# Patient Record
Sex: Female | Born: 1970 | Race: Black or African American | Hispanic: No | Marital: Married | Smoking: Never smoker
Health system: Southern US, Community
[De-identification: ages and names within clinical notes are randomized; demographics above are authoritative.]

## PROBLEM LIST (undated history)

## (undated) DIAGNOSIS — C50919 Malignant neoplasm of unspecified site of unspecified female breast: Secondary | ICD-10-CM

## (undated) DIAGNOSIS — J45909 Unspecified asthma, uncomplicated: Secondary | ICD-10-CM

## (undated) DIAGNOSIS — L509 Urticaria, unspecified: Secondary | ICD-10-CM

## (undated) DIAGNOSIS — K219 Gastro-esophageal reflux disease without esophagitis: Secondary | ICD-10-CM

## (undated) DIAGNOSIS — L409 Psoriasis, unspecified: Secondary | ICD-10-CM

## (undated) HISTORY — DX: Psoriasis, unspecified: L40.9

## (undated) HISTORY — PX: ABDOMINAL HYSTERECTOMY: SHX81

## (undated) HISTORY — DX: Urticaria, unspecified: L50.9

---

## 2003-09-14 ENCOUNTER — Emergency Department (HOSPITAL_COMMUNITY): Admission: EM | Admit: 2003-09-14 | Discharge: 2003-09-15 | Payer: Self-pay | Admitting: Emergency Medicine

## 2003-12-26 ENCOUNTER — Emergency Department (HOSPITAL_COMMUNITY): Admission: EM | Admit: 2003-12-26 | Discharge: 2003-12-26 | Payer: Self-pay | Admitting: Emergency Medicine

## 2004-02-15 ENCOUNTER — Emergency Department (HOSPITAL_COMMUNITY): Admission: EM | Admit: 2004-02-15 | Discharge: 2004-02-15 | Payer: Self-pay | Admitting: Family Medicine

## 2004-05-18 ENCOUNTER — Emergency Department (HOSPITAL_COMMUNITY): Admission: EM | Admit: 2004-05-18 | Discharge: 2004-05-18 | Payer: Self-pay | Admitting: Family Medicine

## 2004-12-07 ENCOUNTER — Emergency Department (HOSPITAL_COMMUNITY): Admission: EM | Admit: 2004-12-07 | Discharge: 2004-12-07 | Payer: Self-pay | Admitting: Family Medicine

## 2005-02-17 ENCOUNTER — Emergency Department (HOSPITAL_COMMUNITY): Admission: EM | Admit: 2005-02-17 | Discharge: 2005-02-18 | Payer: Self-pay | Admitting: Emergency Medicine

## 2005-06-26 ENCOUNTER — Other Ambulatory Visit: Admission: RE | Admit: 2005-06-26 | Discharge: 2005-06-26 | Payer: Self-pay | Admitting: Obstetrics and Gynecology

## 2005-10-24 ENCOUNTER — Other Ambulatory Visit: Admission: RE | Admit: 2005-10-24 | Discharge: 2005-10-24 | Payer: Self-pay | Admitting: Obstetrics and Gynecology

## 2005-11-13 ENCOUNTER — Ambulatory Visit: Admission: AD | Admit: 2005-11-13 | Discharge: 2005-11-13 | Payer: Self-pay | Admitting: Obstetrics and Gynecology

## 2006-11-07 ENCOUNTER — Inpatient Hospital Stay (HOSPITAL_COMMUNITY): Admission: AD | Admit: 2006-11-07 | Discharge: 2006-11-07 | Payer: Self-pay | Admitting: Obstetrics & Gynecology

## 2007-11-26 ENCOUNTER — Emergency Department (HOSPITAL_COMMUNITY): Admission: EM | Admit: 2007-11-26 | Discharge: 2007-11-26 | Payer: Self-pay | Admitting: Emergency Medicine

## 2010-12-16 LAB — URINALYSIS, ROUTINE W REFLEX MICROSCOPIC
Bilirubin Urine: NEGATIVE
Glucose, UA: NEGATIVE
Ketones, ur: NEGATIVE
Leukocytes, UA: NEGATIVE
Nitrite: NEGATIVE
Protein, ur: NEGATIVE
Specific Gravity, Urine: 1.02
Urobilinogen, UA: 0.2
pH: 6.5

## 2010-12-16 LAB — CBC
HCT: 31 — ABNORMAL LOW
Hemoglobin: 10.7 — ABNORMAL LOW
MCHC: 34.7
MCV: 85.4
Platelets: 199
RBC: 3.63 — ABNORMAL LOW
RDW: 16.2 — ABNORMAL HIGH
WBC: 6.5

## 2010-12-16 LAB — GC/CHLAMYDIA PROBE AMP, GENITAL
Chlamydia, DNA Probe: NEGATIVE
GC Probe Amp, Genital: NEGATIVE

## 2010-12-16 LAB — URINE MICROSCOPIC-ADD ON

## 2010-12-16 LAB — WET PREP, GENITAL
Clue Cells Wet Prep HPF POC: NONE SEEN
Trich, Wet Prep: NONE SEEN
Yeast Wet Prep HPF POC: NONE SEEN

## 2010-12-16 LAB — POCT PREGNANCY, URINE
Operator id: 117411
Preg Test, Ur: NEGATIVE

## 2014-06-18 ENCOUNTER — Ambulatory Visit: Payer: Self-pay | Admitting: Internal Medicine

## 2014-06-18 DIAGNOSIS — Z0289 Encounter for other administrative examinations: Secondary | ICD-10-CM

## 2015-10-04 ENCOUNTER — Emergency Department (HOSPITAL_COMMUNITY)
Admission: EM | Admit: 2015-10-04 | Discharge: 2015-10-04 | Disposition: A | Discharge status: Home / Self Care | Payer: Self-pay | Attending: Emergency Medicine | Admitting: Emergency Medicine

## 2015-10-04 ENCOUNTER — Encounter (HOSPITAL_COMMUNITY): Payer: Self-pay | Admitting: Emergency Medicine

## 2015-10-04 ENCOUNTER — Emergency Department (HOSPITAL_COMMUNITY): Payer: Self-pay

## 2015-10-04 DIAGNOSIS — Z79899 Other long term (current) drug therapy: Secondary | ICD-10-CM | POA: Insufficient documentation

## 2015-10-04 DIAGNOSIS — Z791 Long term (current) use of non-steroidal anti-inflammatories (NSAID): Secondary | ICD-10-CM | POA: Insufficient documentation

## 2015-10-04 DIAGNOSIS — R0789 Other chest pain: Secondary | ICD-10-CM | POA: Insufficient documentation

## 2015-10-04 DIAGNOSIS — Z7982 Long term (current) use of aspirin: Secondary | ICD-10-CM | POA: Insufficient documentation

## 2015-10-04 DIAGNOSIS — N12 Tubulo-interstitial nephritis, not specified as acute or chronic: Secondary | ICD-10-CM | POA: Insufficient documentation

## 2015-10-04 LAB — URINE MICROSCOPIC-ADD ON

## 2015-10-04 LAB — BASIC METABOLIC PANEL
Anion gap: 7 (ref 5–15)
BUN: 10 mg/dL (ref 6–20)
CO2: 26 mmol/L (ref 22–32)
Calcium: 9 mg/dL (ref 8.9–10.3)
Chloride: 103 mmol/L (ref 101–111)
Creatinine, Ser: 0.79 mg/dL (ref 0.44–1.00)
GFR calc Af Amer: >60 mL/min (ref >60)
GFR calc non Af Amer: >60 mL/min (ref >60)
Glucose, Bld: 101 mg/dL — ABNORMAL HIGH (ref 65–99)
Potassium: 3.3 mmol/L — ABNORMAL LOW (ref 3.5–5.1)
Sodium: 136 mmol/L (ref 135–145)

## 2015-10-04 LAB — CBC
HCT: 36.7 % (ref 36.0–46.0)
Hemoglobin: 12.9 g/dL (ref 12.0–15.0)
MCH: 31.1 pg (ref 26.0–34.0)
MCHC: 35.1 g/dL (ref 30.0–36.0)
MCV: 88.4 fL (ref 78.0–100.0)
Platelets: 202 10*3/uL (ref 150–400)
RBC: 4.15 MIL/uL (ref 3.87–5.11)
RDW: 12.9 % (ref 11.5–15.5)
WBC: 11.3 10*3/uL — ABNORMAL HIGH (ref 4.0–10.5)

## 2015-10-04 LAB — I-STAT TROPONIN, ED
Troponin i, poc: 0 ng/mL (ref 0.00–0.08)
Troponin i, poc: 0 ng/mL (ref 0.00–0.08)

## 2015-10-04 LAB — URINALYSIS, ROUTINE W REFLEX MICROSCOPIC
Bilirubin Urine: NEGATIVE
GLUCOSE, UA: NEGATIVE mg/dL
Ketones, ur: NEGATIVE mg/dL
Nitrite: NEGATIVE
PH: 7.5 (ref 5.0–8.0)
Protein, ur: 30 mg/dL — AB
Specific Gravity, Urine: 1.019 (ref 1.005–1.030)

## 2015-10-04 LAB — D-DIMER, QUANTITATIVE: D-Dimer, Quant: 0.38 ug/mL-FEU (ref 0.00–0.50)

## 2015-10-04 MED ORDER — OXYCODONE-ACETAMINOPHEN 5-325 MG PO TABS: 1.0000 | ORAL_TABLET | ORAL | 0 refills | Status: DC | PRN

## 2015-10-04 MED ORDER — MORPHINE SULFATE (PF) 4 MG/ML IV SOLN
4.0000 mg | Freq: Once | INTRAVENOUS | Status: AC
  Administered 2015-10-04: 4 mg via INTRAVENOUS
  Filled 2015-10-04: qty 1

## 2015-10-04 MED ORDER — ONDANSETRON HCL 4 MG/2ML IJ SOLN
4.0000 mg | Freq: Once | INTRAMUSCULAR | Status: AC
  Administered 2015-10-04: 4 mg via INTRAVENOUS
  Filled 2015-10-04: qty 2

## 2015-10-04 MED ORDER — KETOROLAC TROMETHAMINE 30 MG/ML IJ SOLN
30.0000 mg | Freq: Once | INTRAMUSCULAR | Status: AC
  Administered 2015-10-04: 30 mg via INTRAVENOUS
  Filled 2015-10-04: qty 1

## 2015-10-04 MED ORDER — SODIUM CHLORIDE 0.9 % IV BOLUS (SEPSIS)
1000.0000 mL | Freq: Once | INTRAVENOUS | Status: AC
  Administered 2015-10-04: 1000 mL via INTRAVENOUS

## 2015-10-04 MED ORDER — MORPHINE SULFATE (PF) 4 MG/ML IV SOLN
4.0000 mg | Freq: Once | INTRAVENOUS | Status: DC
Start: 2015-10-04 — End: 2015-10-04

## 2015-10-04 MED ORDER — DEXTROSE 5 % IV SOLN
1.0000 g | Freq: Once | INTRAVENOUS | Status: AC
  Administered 2015-10-04: 1 g via INTRAVENOUS
  Filled 2015-10-04: qty 10

## 2015-10-04 MED ORDER — ONDANSETRON HCL 4 MG PO TABS: 4.0000 mg | ORAL_TABLET | Freq: Four times a day (QID) | ORAL | 0 refills | Status: DC

## 2015-10-04 MED ORDER — CIPROFLOXACIN HCL 500 MG PO TABS: 500.0000 mg | ORAL_TABLET | Freq: Two times a day (BID) | ORAL | 0 refills | Status: DC

## 2015-10-04 MED ORDER — FLUCONAZOLE 200 MG PO TABS
200.0000 mg | ORAL_TABLET | Freq: Once | ORAL | Status: DC
  Filled 2015-10-04: qty 1

## 2015-10-04 NOTE — Progress Notes (Signed)
EDCM spoke to patient at bedside. Patient confirms she does not have a pcp or insurance living in [place].  [facility] provided patient with contact information to [facility], informed patient of services there and walk in times.  EDCM also provided patient with list of pcps who accept self pay patients, list of discount pharmacies and websites needymeds.org and GoodRX.com for medication assistance, phone number to inquire about the orange card, phone number to inquire about [employer], phone number to inquire about the [place], financial resources in the community such as local churches, [employer], urban ministries, and dental assistance for uninsured patients.  Patient thankful for resources.  No further EDCM needs at this time.

## 2015-10-04 NOTE — ED Triage Notes (Signed)
Pt reports lower back pain with associated "cloudy pee." Pt continues to reports new onset left chest sharpness with arrival to ED. Pt reports dry mouth with onset of chest pain.

## 2015-10-04 NOTE — ED Provider Notes (Signed)
[facility] DEPT Provider Note   CSN: [id number] Arrival date & time: 10/04/15  1602  First Provider Contact:  First MD Initiated Contact with Patient 10/04/15 1750        History   Chief Complaint Chief Complaint  Patient presents with  . Back Pain  . Chest Pain    HPI Darlene Peters is a 45 y.o. female.  HPI Darlene Peters is a 45 y.o. female with PMH significant for osteoarthritis who presents with gradual onset, constant, mild-moderate low back pain that has become severe today and in her b/l flanks.  Associated symptoms include cloudy urine and dysuria, and nausea.  No fever, but she has had chills.  She has taken Azos without relief.  She has taken Mobic and Naproxen without relief.  No vaginal complaints.   She also reports she began experiencing left sided non-radiating chest pain she describes as "being punched in the chest" today with associated palpitations, tachycardia, and SOB.  She states the pain lasts for a couple of seconds and is sporadic.  No DOE.  She states this has happened before in the past when she's been in pain.  No personal cardiac history.  No hx of DVT/PE.  She does not smoke, denies illicit drug use.  Dad died of MI at age 7.  History reviewed. No pertinent past medical history.  There are no active problems to display for this patient.   Past Surgical History:  Procedure Laterality Date  . ABDOMINAL HYSTERECTOMY     partial    OB History    No data available       Home Medications    Prior to Admission medications   Medication Sig Start Date End Date Taking? Authorizing Provider  acetaminophen (TYLENOL) 500 MG tablet Take 1,000 mg by mouth every 6 (six) hours as needed for headache.   Yes Historical Provider, MD  aspirin-acetaminophen-caffeine (EXCEDRIN MIGRAINE) [phone] MG tablet Take 1 tablet by mouth every 6 (six) hours as needed for headache.   Yes Historical Provider, MD  meloxicam (MOBIC) 7.5 MG tablet Take 7.5 mg by  mouth daily as needed for pain.   Yes Historical Provider, MD  ciprofloxacin (CIPRO) 500 MG tablet Take 1 tablet (500 mg total) by mouth 2 (two) times daily. 10/04/15   Mckenzee Beem, PA-C  ondansetron (ZOFRAN) 4 MG tablet Take 1 tablet (4 mg total) by mouth every 6 (six) hours. 10/04/15   Gloriann Loan, PA-C  oxyCODONE-acetaminophen (PERCOCET/ROXICET) 5-325 MG tablet Take 1 tablet by mouth every 4 (four) hours as needed for severe pain. 10/04/15   Gloriann Loan, PA-C    Family History No family history on file.  Social History Social History  Substance Use Topics  . Smoking status: Never Smoker  . Smokeless tobacco: Not on file  . Alcohol use No     Allergies   Review of patient's allergies indicates no known allergies.   Review of Systems Review of Systems All other systems negative unless otherwise stated in HPI   Physical Exam Updated Vital Signs BP 133/84 (BP Location: Right Arm)   Pulse 107   Temp 99.5 F (37.5 C) (Oral)   Resp 25   Ht 5\' 9"  (1.753 m)   Wt 83.9 kg   LMP  (LMP Unknown)   SpO2 97%   BMI 27.32 kg/m   Physical Exam  Constitutional: She is oriented to person, place, and time. She appears well-developed and well-nourished.  Non-toxic appearance. She does not have a  sickly appearance. She does not appear ill.  Appears uncomfortable, lying on side in the bed.   HENT:  Head: Normocephalic and atraumatic.  Mouth/Throat: Oropharynx is clear and moist.  Eyes: Conjunctivae are normal. Pupils are equal, round, and reactive to light.  Neck: Normal range of motion. Neck supple.  Cardiovascular: Regular rhythm.  Tachycardia present.   HR 120 on monitor.   Pulmonary/Chest: Effort normal and breath sounds normal. No accessory muscle usage or stridor. No respiratory distress. She has no wheezes. She has no rhonchi. She has no rales.  Abdominal: Soft. Bowel sounds are normal. She exhibits no distension and no mass. There is tenderness (mild) in the suprapubic area. There is  CVA tenderness (bilateral, R>L). There is no rigidity, no rebound and no guarding.  Musculoskeletal: Normal range of motion.  Lymphadenopathy:    She has no cervical adenopathy.  Neurological: She is alert and oriented to person, place, and time.  Speech clear without dysarthria.  Skin: Skin is warm and dry.  Psychiatric: She has a normal mood and affect. Her behavior is normal.     ED Treatments / Results  Labs (all labs ordered are listed, but only abnormal results are displayed) Labs Reviewed  BASIC METABOLIC PANEL - Abnormal; Notable for the following:       Result Value   Potassium 3.3 (*)    Glucose, Bld 101 (*)    All other components within normal limits  CBC - Abnormal; Notable for the following:    WBC 11.3 (*)    All other components within normal limits  URINALYSIS, ROUTINE W REFLEX MICROSCOPIC (NOT AT [facility]) - Abnormal; Notable for the following:    APPearance CLOUDY (*)    Hgb urine dipstick MODERATE (*)    Protein, ur 30 (*)    Leukocytes, UA MODERATE (*)    All other components within normal limits  URINE MICROSCOPIC-ADD ON - Abnormal; Notable for the following:    Squamous Epithelial / LPF 0-5 (*)    Bacteria, UA MANY (*)    All other components within normal limits  URINE CULTURE  D-DIMER, QUANTITATIVE (NOT AT [facility])  Randolm Idol, ED  I-STAT TROPOININ, ED    EKG  EKG Interpretation  Date/Time:  Monday October 04 2015 16:14:52 EDT Ventricular Rate:  124 PR Interval:    QRS Duration: 85 QT Interval:  305 QTC Calculation: 438 R Axis:   6 Text Interpretation:  Sinus tachycardia Probable left atrial enlargement Low voltage, precordial leads Minimal ST elevation, inferior leads Baseline wander in lead(s) V2 Confirmed by ZAMMIT  MD, JOSEPH [id number]) on 10/04/2015 7:08:22 PM       Radiology Dg Chest 2 View  Result Date: 10/04/2015 CLINICAL DATA:  Pt reports lower back pain with associated "cloudy pee." Pt continues to report new onset left chest  sharpness with arrival to ED. Pt reports dry mouth with onset of chest pain. No known heart problems. EXAM: CHEST  2 VIEW COMPARISON:  None. FINDINGS: The heart size and mediastinal contours are within normal limits. Both lungs are clear. The visualized skeletal structures are unremarkable. IMPRESSION: No active cardiopulmonary disease. Electronically Signed   By: Nolon Nations M.D.   On: 10/04/2015 17:22    Procedures Procedures (including critical care time)  Medications Ordered in ED Medications  sodium chloride 0.9 % bolus 1,000 mL (0 mLs Intravenous Stopped 10/04/15 1922)  morphine 4 MG/ML injection 4 mg (4 mg Intravenous Given 10/04/15 1829)  ondansetron (ZOFRAN) injection 4 mg (4  mg Intravenous Given 10/04/15 1830)  cefTRIAXone (ROCEPHIN) 1 g in dextrose 5 % 50 mL IVPB (0 g Intravenous Stopped 10/04/15 2054)  ketorolac (TORADOL) 30 MG/ML injection 30 mg (30 mg Intravenous Given 10/04/15 1939)  sodium chloride 0.9 % bolus 1,000 mL (0 mLs Intravenous Stopped 10/04/15 2054)  morphine 4 MG/ML injection 4 mg (4 mg Intravenous Given 10/04/15 2047)     Initial Impression / Assessment and Plan / ED Course  I have reviewed the triage vital signs and the nursing notes.  Pertinent labs & imaging results that were available during my care of the patient were reviewed by me and considered in my medical decision making (see chart for details).  Clinical Course   Patient presents with b/l flank pain, dysuria, and cloudy urine over the past couple of days with associated nausea.  UA appears infectious.  Urine culture sent.  Normal renal function.  Mild leukocytosis, 11.3. She is afebrile.  Suspect early pyelonephritis.  Will give fluids, zofran,  morphine, and 1g IV rocephin.   Patient presents with chest pain lasting a couple of seconds with associated palpitations, SOB, and tachycardia (HR 120 during PE).  No hx of DVT/PE.  Low risk using Wells' criteria, d-dimer normal.  EKG shows sinus tachycardia.   CXR normal.  Troponin x 2 0.00.  HEART score 1.  Suspect related to pain versus ACS.  Continued pain, will give Toradol and morphine.  Continues to be afebrile.  She is not orthostatic.  Patient received 2L IVF.  Pain controlled.  Tachycardia improved. Able to tolerate PO intake without difficulty.  Plan to discharge home with Cipro, percocet, and zofran. Follow up PCP. Return precautions discussed.  Patient agrees and acknowledges the above plan for discharge.   Case has been discussed with Dr. Roderic Palau who agrees with the above plan for discharge.   Final Clinical Impressions(s) / ED Diagnoses   Final diagnoses:  Pyelonephritis  Atypical chest pain    New Prescriptions New Prescriptions   CIPROFLOXACIN (CIPRO) 500 MG TABLET    Take 1 tablet (500 mg total) by mouth 2 (two) times daily.   ONDANSETRON (ZOFRAN) 4 MG TABLET    Take 1 tablet (4 mg total) by mouth every 6 (six) hours.   OXYCODONE-ACETAMINOPHEN (PERCOCET/ROXICET) 5-325 MG TABLET    Take 1 tablet by mouth every 4 (four) hours as needed for severe pain.     Gloriann Loan, PA-C 10/04/15 2123    Milton Ferguson, MD 10/04/15 [phone]

## 2015-10-07 LAB — URINE CULTURE: Culture: 70000 — AB

## 2015-10-08 ENCOUNTER — Telehealth (HOSPITAL_BASED_OUTPATIENT_CLINIC_OR_DEPARTMENT_OTHER): Payer: Self-pay | Admitting: *Deleted

## 2015-10-08 NOTE — Telephone Encounter (Signed)
Post ED Visit - Positive Culture Follow-up  Culture report reviewed by antimicrobial stewardship pharmacist:  []  Elenor Quinones, Pharm.D. [x]  Heide Guile, Pharm.D., BCPS []  Parks Neptune, Pharm.D. []  Alycia Rossetti, Pharm.D., BCPS []  [place], Pharm.D., BCPS, AAHIVP []  Legrand Como, Pharm.D., BCPS, AAHIVP []  Milus Glazier, Pharm.D. []  Stephens November, Pharm.D.  Positive Escherichia Coli urine culture Treated with ciprofloxacin Hcl, organism sensitive to the same and no further patient follow-up is required at this time.  Boden Stucky, Philis Nettle 10/08/2015, 2:25 PM

## 2016-05-18 ENCOUNTER — Other Ambulatory Visit: Payer: Self-pay | Admitting: Internal Medicine

## 2016-05-18 DIAGNOSIS — M5441 Lumbago with sciatica, right side: Secondary | ICD-10-CM

## 2016-06-04 ENCOUNTER — Other Ambulatory Visit: Payer: Self-pay

## 2017-01-11 DIAGNOSIS — M545 Low back pain, unspecified: Secondary | ICD-10-CM | POA: Insufficient documentation

## 2018-04-28 ENCOUNTER — Emergency Department (HOSPITAL_COMMUNITY)
Admission: EM | Admit: 2018-04-28 | Discharge: 2018-04-28 | Disposition: A | Discharge status: Home / Self Care | Payer: PRIVATE HEALTH INSURANCE | Attending: Emergency Medicine | Admitting: Emergency Medicine

## 2018-04-28 ENCOUNTER — Encounter (HOSPITAL_COMMUNITY): Payer: Self-pay | Admitting: Emergency Medicine

## 2018-04-28 DIAGNOSIS — R42 Dizziness and giddiness: Secondary | ICD-10-CM | POA: Diagnosis not present

## 2018-04-28 DIAGNOSIS — Z79899 Other long term (current) drug therapy: Secondary | ICD-10-CM | POA: Diagnosis not present

## 2018-04-28 DIAGNOSIS — R0789 Other chest pain: Secondary | ICD-10-CM | POA: Diagnosis not present

## 2018-04-28 LAB — RAPID URINE DRUG SCREEN, HOSP PERFORMED
AMPHETAMINES: NOT DETECTED
BARBITURATES: NOT DETECTED
Benzodiazepines: NOT DETECTED
COCAINE: NOT DETECTED
OPIATES: NOT DETECTED
TETRAHYDROCANNABINOL: NOT DETECTED

## 2018-04-28 LAB — URINALYSIS, ROUTINE W REFLEX MICROSCOPIC
Bacteria, UA: NONE SEEN
Bilirubin Urine: NEGATIVE
GLUCOSE, UA: NEGATIVE mg/dL
Ketones, ur: NEGATIVE mg/dL
Leukocytes,Ua: NEGATIVE
Nitrite: NEGATIVE
Protein, ur: NEGATIVE mg/dL
Specific Gravity, Urine: 1.01 (ref 1.005–1.030)
pH: 5 (ref 5.0–8.0)

## 2018-04-28 LAB — COMPREHENSIVE METABOLIC PANEL
ALBUMIN: 4.4 g/dL (ref 3.5–5.0)
ALT: 21 U/L (ref 0–44)
ANION GAP: 6 (ref 5–15)
AST: 21 U/L (ref 15–41)
Alkaline Phosphatase: 50 U/L (ref 38–126)
BILIRUBIN TOTAL: 0.6 mg/dL (ref 0.3–1.2)
BUN: 15 mg/dL (ref 6–20)
CO2: 25 mmol/L (ref 22–32)
Calcium: 9.3 mg/dL (ref 8.9–10.3)
Chloride: 106 mmol/L (ref 98–111)
Creatinine, Ser: 0.74 mg/dL (ref 0.44–1.00)
GFR calc Af Amer: >60 mL/min (ref >60)
GFR calc non Af Amer: >60 mL/min (ref >60)
Glucose, Bld: 95 mg/dL (ref 70–99)
POTASSIUM: 3.7 mmol/L (ref 3.5–5.1)
Sodium: 137 mmol/L (ref 135–145)
TOTAL PROTEIN: 7.6 g/dL (ref 6.5–8.1)

## 2018-04-28 LAB — CBC WITH DIFFERENTIAL/PLATELET
Abs Immature Granulocytes: 0.01 10*3/uL (ref 0.00–0.07)
BASOS ABS: 0 10*3/uL (ref 0.0–0.1)
Basophils Relative: 1 %
EOS ABS: 0.2 10*3/uL (ref 0.0–0.5)
Eosinophils Relative: 4 %
HEMATOCRIT: 41.5 % (ref 36.0–46.0)
HEMOGLOBIN: 14 g/dL (ref 12.0–15.0)
IMMATURE GRANULOCYTES: 0 %
LYMPHS ABS: 1.3 10*3/uL (ref 0.7–4.0)
LYMPHS PCT: 22 %
MCH: 31.5 pg (ref 26.0–34.0)
MCHC: 33.7 g/dL (ref 30.0–36.0)
MCV: 93.5 fL (ref 80.0–100.0)
Monocytes Absolute: 0.4 10*3/uL (ref 0.1–1.0)
Monocytes Relative: 6 %
NEUTROS PCT: 67 %
NRBC: 0 % (ref 0.0–0.2)
Neutro Abs: 4.2 10*3/uL (ref 1.7–7.7)
Platelets: 159 10*3/uL (ref 150–400)
RBC: 4.44 MIL/uL (ref 3.87–5.11)
RDW: 13 % (ref 11.5–15.5)
WBC: 6.1 10*3/uL (ref 4.0–10.5)

## 2018-04-28 LAB — I-STAT TROPONIN, ED
Troponin i, poc: 0 ng/mL (ref 0.00–0.08)
Troponin i, poc: 0.01 ng/mL (ref 0.00–0.08)

## 2018-04-28 LAB — PREGNANCY, URINE: Preg Test, Ur: NEGATIVE

## 2018-04-28 NOTE — ED Triage Notes (Signed)
Patient here from home with complaints of dizziness x3 this month. Reports "drunk feeling". Nausea, no vomiting. Hx of migraines. Currently pain 4/10 in head. Ambulatory.

## 2018-04-28 NOTE — ED Provider Notes (Addendum)
[facility] DEPT Provider Note   CSN: [id number] Arrival date & time: 04/28/18  0620    History   Chief Complaint Chief Complaint  Patient presents with  . Dizziness    HPI Darlene Peters is a 48 y.o. female with a PMH of migraines presents with intermittent dizziness onset this morning. Patient describes dizziness as the room is spinning and states it is worse with laying down and standing up. Patient states she had a similar episode of dizziness last month and she drank water with relief.  Patient states dizziness has improved on its own today. Patient states she feels like she has been drugged. Patient reports an intermittent mild frontal headache and states it is similar to previous headaches. Patient reports taking an Excedrin this morning with relief. Patient denies a headache currently. Patient reports nausea, but denies vomiting. Patient reports she frequently vomits when she has migraines. Patient also reports constant middle non radiating chest pain that she describes as a mild ache for 1 month. Nothing makes the chest pain better or worse. Patient denies using OCPs, recent travel, recent surgery, leg edema/pain, or hx of DVT/PE. Patient denies shortness of breath, weakness, vision changes, numbness, facial asymmetry, or difficulty speaking. Patient denies congestion, rhinorrhea, cough, neck pain, or fever. Patient denies tobacco, alcohol, or drug use.     HPI  History reviewed. No pertinent past medical history.  There are no active problems to display for this patient.   Past Surgical History:  Procedure Laterality Date  . ABDOMINAL HYSTERECTOMY     partial     OB History   No obstetric history on file.      Home Medications    Prior to Admission medications   Medication Sig Start Date End Date Taking? Authorizing Provider  ARMOUR THYROID 15 MG tablet Take 15 mg by mouth every morning. 04/12/18  Yes [provider]    aspirin-acetaminophen-caffeine (EXCEDRIN MIGRAINE) [phone] MG tablet Take 1 tablet by mouth every 6 (six) hours as needed for headache.   Yes [provider]  Multiple Vitamins-Minerals (MULTIVITAMIN PO) Take 1 tablet by mouth daily.   Yes [provider]    Family History No family history on file.  Social History Social History   Tobacco Use  . Smoking status: Never Smoker  . Smokeless tobacco: Never Used  Substance Use Topics  . Alcohol use: No  . Drug use: No    Allergies   Shellfish allergy   Review of Systems Review of Systems  Constitutional: Negative for activity change, appetite change, chills, diaphoresis, fatigue, fever and unexpected weight change.  HENT: Negative for congestion, rhinorrhea and sore throat.   Eyes: Negative for photophobia and visual disturbance.  Respiratory: Negative for cough, chest tightness and shortness of breath.   Cardiovascular: Positive for chest pain. Negative for palpitations and leg swelling.  Gastrointestinal: Positive for nausea. Negative for abdominal pain, blood in stool, diarrhea and vomiting.  Endocrine: Negative for cold intolerance and heat intolerance.  Genitourinary: Negative for dysuria.  Musculoskeletal: Negative for myalgias and neck pain.  Skin: Negative for wound.  Allergic/Immunologic: Negative for immunocompromised state.  Neurological: Positive for dizziness, light-headedness and headaches. Negative for tremors, seizures, syncope, facial asymmetry, speech difficulty, weakness and numbness.  Psychiatric/Behavioral: Negative for confusion, dysphoric mood and sleep disturbance. The patient is not nervous/anxious.     Physical Exam Updated Vital Signs BP 127/83   Pulse 73   Temp 97.7 F (36.5 C) (Oral)  Resp 19   LMP  (LMP Unknown)   SpO2 100%   Physical Exam Vitals signs and nursing note reviewed.  Constitutional:      General: She is not in acute distress.    Appearance: She is  well-developed. She is not diaphoretic.  HENT:     Head: Normocephalic and atraumatic.     Comments: Negative Scientist, forensic.    Right Ear: Tympanic membrane, ear canal and external ear normal.     Left Ear: Tympanic membrane, ear canal and external ear normal.     Nose: Nose normal. No congestion or rhinorrhea.     Mouth/Throat:     Mouth: Mucous membranes are moist.     Pharynx: No oropharyngeal exudate or posterior oropharyngeal erythema.  Eyes:     Extraocular Movements: Extraocular movements intact.     Conjunctiva/sclera: Conjunctivae normal.     Pupils: Pupils are equal, round, and reactive to light.  Neck:     Musculoskeletal: Normal range of motion and neck supple.     Vascular: No JVD.  Cardiovascular:     Rate and Rhythm: Normal rate and regular rhythm.     Pulses: Normal pulses.          Radial pulses are 2+ on the right side and 2+ on the left side.       Dorsalis pedis pulses are 2+ on the right side and 2+ on the left side.     Heart sounds: Normal heart sounds. No murmur. No friction rub. No gallop.   Pulmonary:     Effort: Pulmonary effort is normal. No respiratory distress.     Breath sounds: Normal breath sounds. No wheezing or rales.  Chest:     Chest wall: No tenderness.  Abdominal:     Palpations: Abdomen is soft.     Tenderness: There is no abdominal tenderness.  Musculoskeletal: Normal range of motion.  Skin:    General: Skin is warm.     Capillary Refill: Capillary refill takes less than 2 seconds.     Coloration: Skin is not pale.     Findings: No rash.  Neurological:     Mental Status: She is alert and oriented to person, place, and time.    Mental Status:  Alert, oriented, thought content appropriate, able to give a coherent history. Speech fluent without evidence of aphasia. Able to follow 2 step commands without difficulty.  Cranial Nerves:  II:  Peripheral visual fields grossly normal, pupils equal, round, reactive to light III,IV,  VI: ptosis not present, extra-ocular motions intact bilaterally  V,VII: smile symmetric, facial light touch sensation equal VIII: hearing grossly normal to voice  X: uvula elevates symmetrically  XI: bilateral shoulder shrug symmetric and strong XII: midline tongue extension without fassiculations Motor:  Normal tone. 5/5 in upper and lower extremities bilaterally including strong and equal grip strength and dorsiflexion/plantar flexion Sensory: light touch normal in all extremities.  Deep Tendon Reflexes: 2+ and symmetric in the biceps and patella Cerebellar: normal finger-to-nose with bilateral upper extremities Gait: normal gait and balance.  Negative pronator drift. Negative Romberg sign. CV: distal pulses palpable throughout   ED Treatments / Results  Labs (all labs ordered are listed, but only abnormal results are displayed) Labs Reviewed  URINALYSIS, ROUTINE W REFLEX MICROSCOPIC - Abnormal; Notable for the following components:      Result Value   Hgb urine dipstick MODERATE (*)    All other components within normal limits  PREGNANCY, URINE  CBC WITH DIFFERENTIAL/PLATELET  COMPREHENSIVE METABOLIC PANEL  RAPID URINE DRUG SCREEN, HOSP PERFORMED  I-STAT TROPONIN, ED  I-STAT TROPONIN, ED    EKG EKG Interpretation  Date/Time:  Sunday April 28 2018 08:41:53 EST Ventricular Rate:  74 PR Interval:    QRS Duration: 95 QT Interval:  400 QTC Calculation: 444 R Axis:   67 Text Interpretation:  Sinus rhythm Consider left atrial enlargement Low voltage, precordial leads ST elev, probable normal early repol pattern Baseline wander in lead(s) V5 Confirmed by Julianne Rice [phone]) on 04/28/2018 10:30:19 AM   Radiology No results found.  Procedures Procedures (including critical care time)  Medications Ordered in ED Medications - No data to display   Initial Impression / Assessment and Plan / ED Course  I have reviewed the triage vital signs and the nursing  notes.  Pertinent labs & imaging results that were available during my care of the patient were reviewed by me and considered in my medical decision making (see chart for details).  Clinical Course as of Apr 28 1348  Sun Apr 28, 2018  1127 CMP without any electrolyte abnormalities.  Comprehensive metabolic panel [AH]  3888 CBC is unremarkable.  CBC with Differential [AH]  1127 UDS negative.  Rapid urine drug screen (hospital performed) [AH]    Clinical Course User Index [AH] Arville Lime, PA-C      Patient is to be discharged with recommendation to follow up with PCP in regards to today's hospital visit. Suspect dizziness may be due to BPPV based on patient's history. Dizziness was not reproducible in the ER. Dizziness has resolved while in the ER without medications per patient's request. Orthostatic vitals were normal. Chest pain is not likely of cardiac or pulmonary etiology d/t presentation, PERC negative, no JVD or new murmur, RRR, breath sounds equal bilaterally, and negative troponin x 2. EKG reveals sinus rhythm with possible left atrial enlargement and precordial leads ST elevation, probable normal early repolarization pattern. Discussed findings with supervising physician. Pt has been advised to return to the ED if CP becomes exertional, associated with diaphoresis or nausea, radiates to left jaw/arm, worsens or becomes concerning in any way. Offered Meclizine for dizziness and patient states she prefers to not take medications. Advised patient to follow up with PCP and cardiology. Pt appears reliable for follow up and is agreeable to discharge.   Findings and plan of care discussed with supervising physician Dr. Lita Mains.   Final Clinical Impressions(s) / ED Diagnoses   Final diagnoses:  Dizziness  Atypical chest pain    ED Discharge Orders    None        Arville Lime, [place] 04/28/18 1350    Julianne Rice, MD 04/28/18 1515

## 2018-04-28 NOTE — Discharge Instructions (Addendum)
You have been seen today for dizziness and chest pain. Please read and follow all provided instructions.   1. Medications: usual home medications 2. Treatment: rest, drink plenty of fluids 3. Follow Up: Please follow up with your primary doctor in 2 days for discussion of your diagnoses and further evaluation after today's visit; if you do not have a primary care doctor use the resource guide provided to find one; Please return to the ER for any new or worsening symptoms. Please obtain all of your results from medical records or have your doctors office obtain the results - share them with your doctor - you should be seen at your doctors office. Call today to arrange your follow up.   Take medications as prescribed. Please review all of the medicines and only take them if you do not have an allergy to them. Return to the emergency room for worsening condition or new concerning symptoms. Follow up with your regular doctor. If you don't have a regular doctor use one of the numbers below to establish a primary care doctor.  Please be aware that if you are taking birth control pills, taking other prescriptions, ESPECIALLY ANTIBIOTICS may make the birth control ineffective - if this is the case, either do not engage in sexual activity or use alternative methods of birth control such as condoms until you have finished the medicine and your family doctor says it is OK to restart them. If you are on a blood thinner such as COUMADIN, be aware that any other medicine that you take may cause the coumadin to either work too much, or not enough - you should have your coumadin level rechecked in next 7 days if this is the case.  ?  It is also a possibility that you have an allergic reaction to any of the medicines that you have been prescribed - Everybody reacts differently to medications and while MOST people have no trouble with most medicines, you may have a reaction such as nausea, vomiting, rash, swelling,  shortness of breath. If this is the case, please stop taking the medicine immediately and contact your physician.  ?  You should return to the ER if you develop severe or worsening symptoms.   Emergency Department Resource Guide 1) Find a Doctor and Pay Out of Pocket Although you won't have to find out who is covered by your insurance plan, it is a good idea to ask around and get recommendations. You will then need to call the office and see if the doctor you have chosen will accept you as a new patient and what types of options they offer for patients who are self-pay. Some doctors offer discounts or will set up payment plans for their patients who do not have insurance, but you will need to ask so you aren't surprised when you get to your appointment.  2) Contact Your Local Health Department Not all health departments have doctors that can see patients for sick visits, but many do, so it is worth a call to see if yours does. If you don't know where your local health department is, you can check in your phone book. The CDC also has a tool to help you locate your state's health department, and many state websites also have listings of all of their local health departments.  3) Find a [facility] If your illness is not likely to be very severe or complicated, you may want to try a walk in clinic. These are popping up  all over the country in pharmacies, drugstores, and shopping centers. They're usually staffed by nurse practitioners or physician assistants that have been trained to treat common illnesses and complaints. They're usually fairly quick and inexpensive. However, if you have serious medical issues or chronic medical problems, these are probably not your best option.  No Primary Care Doctor: Call Health Connect at  [phone] - they can help you locate a primary care doctor that  accepts your insurance, provides certain services, etc. Physician Referral [phone]  Emergency  Department Resource Guide 1) Find a Doctor and Pay Out of Pocket Although you won't have to find out who is covered by your insurance plan, it is a good idea to ask around and get recommendations. You will then need to call the office and see if the doctor you have chosen will accept you as a new patient and what types of options they offer for patients who are self-pay. Some doctors offer discounts or will set up payment plans for their patients who do not have insurance, but you will need to ask so you aren't surprised when you get to your appointment.  2) Contact Your Local Health Department Not all health departments have doctors that can see patients for sick visits, but many do, so it is worth a call to see if yours does. If you don't know where your local health department is, you can check in your phone book. The CDC also has a tool to help you locate your state's health department, and many state websites also have listings of all of their local health departments.  3) Find a [facility] If your illness is not likely to be very severe or complicated, you may want to try a walk in clinic. These are popping up all over the country in pharmacies, drugstores, and shopping centers. They're usually staffed by nurse practitioners or physician assistants that have been trained to treat common illnesses and complaints. They're usually fairly quick and inexpensive. However, if you have serious medical issues or chronic medical problems, these are probably not your best option.  No Primary Care Doctor: Call Health Connect at  [phone] - they can help you locate a primary care doctor that  accepts your insurance, provides certain services, etc. Physician Referral Service- [phone]  Chronic Pain Problems: Organization         Address  Phone   Notes  [facility]  [phone] Patients need to be referred by their primary care doctor.   Medication  Assistance: Organization         Address  Phone   Notes  [facility] [address] [place], [place] [address] [phone] --Must be a resident of [facility] -- Must have NO insurance coverage whatsoever (no Medicaid/ Medicare, etc.) -- The pt. MUST have a primary care doctor that directs their care regularly and follows them in the community   [facility]  [phone]   [employer]  [phone]    Agencies that provide inexpensive medical care: Organization         Address  Phone   Notes  [place]  [phone]   [facility]    [phone]   [facility] [place], [place] [address] [phone]   [place] [address], [place] [phone]   [facility]    [phone]  [facility]    [phone]   [facility]  [address], [place] Phone:  [phone], Fax:  [phone] Hours of Operation:  9 am - 6 pm, M-F.  Also accepts Medicaid/Medicare and self-pay.  [facility] for [address], [address], [place] Phone: [phone], Fax: [phone]. Hours of Operation:  8:30 am - 5:30 pm, M-F.  Also accepts Medicaid and self-pay.  [facility] [address], [place] Phone: [phone]   [place], [place], [place] [phone], [phone] Mondays & Thursdays: 7-9 AM.  First 15 patients are seen on a first come, first serve basis.    [place] Providers:  Organization         Address  Phone   Notes  [facility] [address], [place] [phone] Also accepts self-pay patients.  [facility] [address], [place]  [phone]   [place], [address], [place]  [phone]   [facility] [address] [place] [phone]   Lucianne Lei [address], [place]   [phone] Only accepts [place] [facility] [place] patients after they have their name applied to their card.   Self-Pay (no insurance) in [facility]:  Organization         Address  Phone   Notes  Sickle Cell Patients, [facility] [phone]   [facility] [place] [phone]   [facility] [place]  [place], [place], [place] [phone]   [facility]/Dr. Osei-Bonsu  [address] [place] or [address], [place] [phone] Phone number for both [place] and [place] locations is the same.  [facility] and [facility] [address], [place] [phone]   [facility] [address], [place] or [address] [phone]   [facility] [address], [place] [phone], phone; [phone], fax Sees patients 1st and 3rd Saturday of every month.  Must not qualify for public or private insurance (i.e. Medicaid, Medicare, [place] Health Choice, Veterans' Benefits)  Household income should be no more than 200% of the poverty level The clinic cannot treat you if you are pregnant or think you are pregnant  Sexually transmitted diseases are not treated at the clinic.

## 2018-05-09 ENCOUNTER — Ambulatory Visit: Payer: BLUE CROSS/BLUE SHIELD | Admitting: Physician Assistant

## 2018-05-30 ENCOUNTER — Ambulatory Visit: Payer: PRIVATE HEALTH INSURANCE | Admitting: Cardiology

## 2018-07-05 DIAGNOSIS — J4520 Mild intermittent asthma, uncomplicated: Secondary | ICD-10-CM | POA: Diagnosis present

## 2019-05-01 DIAGNOSIS — K219 Gastro-esophageal reflux disease without esophagitis: Secondary | ICD-10-CM | POA: Insufficient documentation

## 2019-06-18 ENCOUNTER — Ambulatory Visit: Payer: PRIVATE HEALTH INSURANCE | Admitting: Dermatology

## 2019-09-03 ENCOUNTER — Telehealth: Payer: Self-pay

## 2019-09-03 NOTE — Telephone Encounter (Signed)
Error

## 2019-09-03 NOTE — Telephone Encounter (Signed)
Patient was here in our [place] system to see you on 05/05/19. You prescribed Calcipotriene for her feet and Elidel for her atopic derm. She was unable to purchase due to the cost of medication.   Can you suggest/advise alternative for her to use until her follow up in August?

## 2019-09-03 NOTE — Telephone Encounter (Signed)
Send Protopic for feet and Eczema. If it can be filled affordably, then have pt call after 1 month of use to let us know if working OK.

## 2019-09-04 MED ORDER — TACROLIMUS 0.1 % EX OINT: TOPICAL_OINTMENT | Freq: Every day | CUTANEOUS | 0 refills | Status: DC

## 2019-09-04 NOTE — Addendum Note (Signed)
Addended by: Johnsie Kindred R on: 09/04/2019 09:59 AM   Modules accepted: Orders

## 2019-09-04 NOTE — Telephone Encounter (Signed)
Prescription sent in and emailed patient to advise her.

## 2019-10-14 DIAGNOSIS — L409 Psoriasis, unspecified: Secondary | ICD-10-CM | POA: Insufficient documentation

## 2019-10-20 ENCOUNTER — Ambulatory Visit: Payer: BC Managed Care – PPO | Admitting: Dermatology

## 2019-11-05 DIAGNOSIS — R5383 Other fatigue: Secondary | ICD-10-CM | POA: Insufficient documentation

## 2021-04-12 ENCOUNTER — Other Ambulatory Visit: Payer: Self-pay | Admitting: Family Medicine

## 2021-04-12 DIAGNOSIS — N63 Unspecified lump in unspecified breast: Secondary | ICD-10-CM

## 2021-04-27 ENCOUNTER — Other Ambulatory Visit: Payer: Self-pay | Admitting: Family Medicine

## 2021-05-10 ENCOUNTER — Other Ambulatory Visit: Payer: Self-pay | Admitting: Family Medicine

## 2021-05-10 DIAGNOSIS — N63 Unspecified lump in unspecified breast: Secondary | ICD-10-CM

## 2021-05-23 ENCOUNTER — Ambulatory Visit: Payer: BC Managed Care – PPO

## 2021-05-23 ENCOUNTER — Ambulatory Visit
Admission: RE | Admit: 2021-05-23 | Discharge: 2021-05-23 | Disposition: A | Discharge status: Home / Self Care | Payer: BC Managed Care – PPO | Source: Ambulatory Visit | Attending: Family Medicine | Admitting: Family Medicine

## 2021-05-23 ENCOUNTER — Other Ambulatory Visit: Payer: Self-pay | Admitting: Family Medicine

## 2021-05-23 ENCOUNTER — Ambulatory Visit
Admission: RE | Admit: 2021-05-23 | Discharge: 2021-05-23 | Disposition: A | Discharge status: Home / Self Care | Payer: 59 | Source: Ambulatory Visit | Attending: Family Medicine | Admitting: Family Medicine

## 2021-05-23 DIAGNOSIS — N63 Unspecified lump in unspecified breast: Secondary | ICD-10-CM

## 2021-05-23 DIAGNOSIS — R599 Enlarged lymph nodes, unspecified: Secondary | ICD-10-CM

## 2021-05-23 DIAGNOSIS — N6320 Unspecified lump in the left breast, unspecified quadrant: Secondary | ICD-10-CM

## 2021-05-23 DIAGNOSIS — N6002 Solitary cyst of left breast: Secondary | ICD-10-CM

## 2021-05-25 ENCOUNTER — Other Ambulatory Visit: Payer: Self-pay | Admitting: Dermatology

## 2021-05-26 ENCOUNTER — Ambulatory Visit
Admission: RE | Admit: 2021-05-26 | Discharge: 2021-05-26 | Disposition: A | Discharge status: Home / Self Care | Payer: 59 | Source: Ambulatory Visit | Attending: Family Medicine | Admitting: Family Medicine

## 2021-05-26 DIAGNOSIS — N6320 Unspecified lump in the left breast, unspecified quadrant: Secondary | ICD-10-CM

## 2021-05-26 DIAGNOSIS — N6002 Solitary cyst of left breast: Secondary | ICD-10-CM

## 2021-05-26 DIAGNOSIS — R599 Enlarged lymph nodes, unspecified: Secondary | ICD-10-CM

## 2021-05-26 HISTORY — PX: BREAST BIOPSY: SHX20

## 2021-06-08 ENCOUNTER — Other Ambulatory Visit: Payer: Self-pay | Admitting: General Surgery

## 2021-06-08 DIAGNOSIS — Z170 Estrogen receptor positive status [ER+]: Secondary | ICD-10-CM

## 2021-06-14 ENCOUNTER — Other Ambulatory Visit: Payer: Self-pay | Admitting: *Deleted

## 2021-06-14 ENCOUNTER — Telehealth: Payer: Self-pay | Admitting: Radiation Oncology

## 2021-06-14 DIAGNOSIS — Z170 Estrogen receptor positive status [ER+]: Secondary | ICD-10-CM | POA: Insufficient documentation

## 2021-06-14 DIAGNOSIS — C50412 Malignant neoplasm of upper-outer quadrant of left female breast: Secondary | ICD-10-CM | POA: Insufficient documentation

## 2021-06-14 NOTE — Telephone Encounter (Signed)
4/11 @ 2:39 pm Left voicemail for patient to call our office.   ?

## 2021-06-15 ENCOUNTER — Telehealth: Payer: Self-pay | Admitting: Hematology and Oncology

## 2021-06-15 ENCOUNTER — Telehealth: Payer: Self-pay | Admitting: Radiation Oncology

## 2021-06-15 ENCOUNTER — Telehealth: Payer: Self-pay | Admitting: Genetic Counselor

## 2021-06-15 NOTE — Telephone Encounter (Signed)
Scheduled appt per 4/11 referral. Pt is aware of appt date and time. Pt is aware to arrive 15 mins prior to appt time and to bring and updated insurance card. Pt is aware of appt location.   ?

## 2021-06-15 NOTE — Telephone Encounter (Signed)
4/12 @ 10:25 am patient left voicemail to go ahead and schedule appointment with Dr. Isidore Moos.  Left voicemail with patient to confirm date/time of appointment.   ?

## 2021-06-19 NOTE — Therapy (Signed)
?OUTPATIENT PHYSICAL THERAPY BREAST CANCER BASELINE EVALUATION ? ? ?Patient Name: Darlene Peters ?MRN: [id number] ?DOB:07/02/1970, 51 y.o., female ?Today's Date: 06/20/2021 ? ? PT End of Session - 06/20/21 1803   ? ? Visit Number 1   ? Number of Visits 2   ? Date for PT Re-Evaluation 08/15/21   ? PT Start Time 1406   ? PT Stop Time 1500   ? PT Time Calculation (min) 54 min   ? Activity Tolerance Patient tolerated treatment well   ? Behavior During Therapy [facility] for tasks assessed/performed   ? ?  ?  ? ?  ? ? ?Past Medical History:  ?Diagnosis Date  ? Psoriasis   ? ?Past Surgical History:  ?Procedure Laterality Date  ? ABDOMINAL HYSTERECTOMY    ? partial  ? BREAST BIOPSY Left 05/26/2021  ? ?Patient Active Problem List  ? Diagnosis Date Noted  ? Malignant neoplasm of upper-outer quadrant of left breast in female, estrogen receptor positive ([place]) 06/14/2021  ? ? ?PCP: Katherina Mires, MD ? ?REFERRING PROVIDER: Jovita Kussmaul, MD ? ?REFERRING DIAG: Left Breast Cancer ? ?THERAPY DIAG:  ?Malignant neoplasm of upper-outer quadrant of left breast in female, estrogen receptor positive ([place]) ? ?Abnormal posture ? ?ONSET DATE: 05/31/2021 ? ?SUBJECTIVE                                                                                                                                                                                          ? ?SUBJECTIVE STATEMENT: ?Patient reports she is here today to be seen by her medical team for her newly diagnosed left breast cancer.  ? ?PERTINENT HISTORY:  ?Patient was diagnosed on 05/31/2021 with left Breast Cancer. It measures 2.6 cm and is located in the upper-outer quadrant. It is ER+,PR-, HER 2 - with a Ki67 of <5%. . She has not yet determined what kind of surgery she will have and she does not have a date yet..  She had an MRI today that will help determine that. ? ?PATIENT GOALS   reduce lymphedema risk and learn post op HEP.  ? ?PAIN:  ?Are you having pain? Yes ?PAIN:  ?Are you having pain?  Yes,  left axilla,also in  neck,LB/spleen area/knees ?NPRS scale: 5/10 ?Pain location: left axilla ?Pain orientation: Left  ?PAIN TYPE: sharp and tingling ?Pain description: sharp  ?Aggravating factors: sleeping on stomach ?Relieving factors: nothing ? ? ?PRECAUTIONS: Active CA Other: Psoriasis ? ?HAND DOMINANCE: right ? ?WEIGHT BEARING RESTRICTIONS No ? ?FALLS:  ?Has patient fallen in last 6 months? No ? ?LIVING ENVIRONMENT: ?Patient lives with: spouse ?Lives in: House/apartment ?Has following equipment at  home: None ? ?OCCUPATION: [employer], telephone work ? ?LEISURE: walking, loom, reading ? ?PRIOR LEVEL OF FUNCTION: Independent ? ? ?OBJECTIVE ? ?COGNITION: ? Overall cognitive status: Within functional limits for tasks assessed   ? ?POSTURE:  ?Forward head and rounded shoulders posture ? ?UPPER EXTREMITY AROM/PROM: ? ?A/PROM RIGHT  06/20/2021 ?  ?Shoulder extension 62  ?Shoulder flexion 150  ?Shoulder abduction 165  ?Shoulder internal rotation 75  ?Shoulder external rotation 105  ?  (Blank rows = not tested) ? ?A/PROM LEFT  06/20/2021  ?Shoulder extension 38, back of arm  ?Shoulder flexion 130  ?Shoulder abduction 145  ?Shoulder internal rotation NT  ?Shoulder external rotation 80 sharp pains in neck  ?  (Blank rows = not tested) ? ? ?CERVICAL AROM: ?All within normal limits except slight limitation with right rotation with complaints of sharp pain in neck and down left arm, improved after ROM exercises;otherwise WNL ? ? ? ? ?UPPER EXTREMITY STRENGTH: WNL ? ? ?LYMPHEDEMA ASSESSMENTS:  ? ?LANDMARK RIGHT  06/20/2021  ?10 cm proximal to olecranon process 31.0  ?Olecranon process 25.7  ?10 cm proximal to ulnar styloid process 20.3  ?Just proximal to ulnar styloid process 15.1  ?Across hand at thumb web space 20.1  ?At base of 2nd digit 5.9  ?(Blank rows = not tested) ? ?[place] LEFT  06/20/2021  ?10 cm proximal to olecranon process 30.2  ?Olecranon process 26.1  ?10 cm proximal to ulnar styloid process 19.1  ?Just proximal to  ulnar styloid process 14.7  ?Across hand at thumb web space 19.3  ?At base of 2nd digit 6.0  ?(Blank rows = not tested) ? ? ?L-DEX LYMPHEDEMA SCREENING: ? ?The patient was assessed using the L-Dex machine today to produce a lymphedema index baseline score. The patient will be reassessed on a regular basis (typically every 3 months) to obtain new L-Dex scores. If the score is > 6.5 points away from his/her baseline score indicating onset of subclinical lymphedema, it will be recommended to wear a compression garment for 4 weeks, 12 hours per day and then be reassessed. If the score continues to be > 6.5 points from baseline at reassessment, we will initiate lymphedema treatment. Assessing in this manner has a 95% rate of preventing clinically significant lymphedema. ? ? L-DEX FLOWSHEETS - 06/20/21 1800   ? ?  ? L-DEX LYMPHEDEMA SCREENING  ? L-DEX MEASUREMENT EXTREMITY Upper Extremity   ? POSITION  Standing   ? DOMINANT SIDE Right   ? At Risk Side Left   ? BASELINE SCORE (UNILATERAL) 0.5   ? ?  ?  ? ?  ? ? ? ?QUICK DASH SURVEY:NT ? ? ?PATIENT EDUCATION:  ?Education details: Lymphedema risk reduction and post op shoulder/posture HEP ?Person educated: Patient ?Education method: Explanation, Demonstration, Handout ?Education comprehension: Patient verbalized understanding and returned demonstration ? ? ?HOME EXERCISE PROGRAM: ?Patient was instructed today in a home exercise program today for post op shoulder range of motion. These included active assist shoulder flexion in supine, scapular retraction, wall walking with shoulder abduction, and hands behind head external rotation in supine, and sitting cervical retraction.  She was encouraged to do these twice a day, holding 3 seconds and repeating 5 times when permitted by her physician. ? ?ASSESSMENT: ? ?CLINICAL IMPRESSION: ?Her multidisciplinary medical team met prior to her assessments to determine a recommended treatment plan. They will determine what type of surgery  she will have after getting her MRI results.She is hoping to have a left lumpectomy . She  did have some intermittent radiating sharp pains with shoulder ROM, and cervical right rotation, in the left lateral neck and into the arm, but these seemed to subside when she relaxed and did her exercises slowly and gradually progressed She will benefit from a post op PT reassessment to determine needs and from L-Dex screens every 3 months for 2 years to detect subclinical lymphedema. ? ?Pt will benefit from skilled therapeutic intervention to improve on the following deficits: Decreased knowledge of precautions, impaired UE functional use, pain, decreased ROM, postural dysfunction.  ? ?PT treatment/interventions: ADL/self-care home management, pt/family education, therapeutic exercise ? ?REHAB POTENTIAL: Excellent ? ?CLINICAL DECISION MAKING: Stable/uncomplicated ? ?EVALUATION COMPLEXITY: Low ? ? ?GOALS: ?Goals reviewed with patient? YES ? ?LONG TERM GOALS: (STG=LTG) ? ? Name Target Date Goal status  ?1 Pt will be able to verbalize understanding of pertinent lymphedema risk reduction practices relevant to her dx specifically related to skin care.  ?Baseline:  No knowledge 06/20/2021 Achieved at eval  ?2 Pt will be able to return demo and/or verbalize understanding of the post op HEP related to regaining shoulder ROM. ?Baseline:  No knowledge 06/20/2021 Achieved at eval  ?3 Pt will be able to verbalize understanding of the importance of attending the post op After Breast CA Class for further lymphedema risk reduction education and therapeutic exercise.  ?Baseline:  No knowledge 06/20/2021 Achieved at eval  ?4 Pt will demo she has regained full shoulder ROM and function post operatively compared to baselines.  ?Baseline: See objective measurements taken today. 08/15/2021   ? ? ? ?PLAN: ?PT FREQUENCY/DURATION: EVAL and 1 follow up appointment.  ? ?PLAN FOR NEXT SESSION: will reassess 3-4 weeks post op to determine needs. ?   ?Patient will follow up at outpatient cancer rehab 3-4 weeks following surgery.  If the patient requires physical therapy at that time, a specific plan will be dictated and sent to the referring physician

## 2021-06-20 ENCOUNTER — Ambulatory Visit: Payer: 59 | Attending: General Surgery

## 2021-06-20 ENCOUNTER — Ambulatory Visit
Admission: RE | Admit: 2021-06-20 | Discharge: 2021-06-20 | Disposition: A | Discharge status: Home / Self Care | Payer: 59 | Source: Ambulatory Visit | Attending: General Surgery | Admitting: General Surgery

## 2021-06-20 DIAGNOSIS — Z170 Estrogen receptor positive status [ER+]: Secondary | ICD-10-CM | POA: Diagnosis not present

## 2021-06-20 DIAGNOSIS — C50412 Malignant neoplasm of upper-outer quadrant of left female breast: Secondary | ICD-10-CM

## 2021-06-20 DIAGNOSIS — R293 Abnormal posture: Secondary | ICD-10-CM | POA: Diagnosis not present

## 2021-06-20 MED ORDER — GADOBUTROL 1 MMOL/ML IV SOLN
9.0000 mL | Freq: Once | INTRAVENOUS | Status: AC | PRN
  Administered 2021-06-20: 9 mL via INTRAVENOUS

## 2021-06-21 ENCOUNTER — Telehealth: Payer: Self-pay | Admitting: Radiation Oncology

## 2021-06-21 NOTE — Progress Notes (Signed)
?Radiation Oncology         ([phone]) [phone] ?________________________________ ? ?Initial Outpatient Consultation ? ?Name: Darlene Peters MRN: [id number]  ?Date: 06/22/2021  DOB: Apr 03, 1970 ? ?FW:YOVZCHY, Jannifer Rodney, MD  Jovita Kussmaul, MD  ? ?REFERRING PHYSICIAN: Jovita Kussmaul, MD ? ?DIAGNOSIS:  ?  ICD-10-CM   ?1. Malignant neoplasm of upper-outer quadrant of left breast in female, estrogen receptor positive ([place])  C50.412   ? Z17.0   ?  ? ? Cancer Staging  ?Malignant neoplasm of upper-outer quadrant of left breast in female, estrogen receptor positive ([place]) ?Staging form: Breast, AJCC 8th Edition ?- Clinical stage from 06/22/2021: Stage IIA (cT2, cN0, cM0, G2, ER+, PR-, HER2-) - Unsigned ?Stage prefix: Initial diagnosis ?Histologic grading system: 3 grade system ? ?NOTE: ER positivity is limited ? ? ?CHIEF COMPLAINT: Here to discuss management of left breast cancer ? ?HISTORY OF PRESENT ILLNESS::Darlene Peters is a 51 y.o. female who presented to her PCP on 03/29/21 for evaluation of 2 palpable lumps in the left breast, focal pain in the left axilla, a change in contour in the lateral aspect of the left breast, and slight retraction of the left nipple. The patient was noted to be unsure how long she noticed these breast changes. Per record review, her last screening mammogram was in May of 2020.  ? ?Subsequent diagnostic bilateral mammogram and left breast ultrasound performed for evaluation on 05/23/21 showed: a highly suspicious 2.6 cm mass in the left breast 3 o'clock position, and one prominent left axillary lymph node with a cortex measuring 4 mm. Imaging also showed a 0.6 cm mass in the left breast 4 o'clock position favored to represent a complicated cyst, and a 0.6 cm mass in the left breast 2 o'clock position favored to represent a benign cyst. No evidence of malignancy was seen within the right breast.  ? 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  ER status: 15% positive with moderate staining intensity; PR status negative; Proliferation marker Ki67 at <5%; Her2 status negative; Grade 2. ? ?Biopsy of the abnormal left axillary lymph node was negative for carcinoma.  ? ?A biopsy was also collected of the 4 o'clock left breast mass which revealed benign findings, including non-proliferative fibrocystic changes, adenosis, and cystic apocrine metaplasia.  ? ?Accordingly, the patient was referred to Dr. Marlou Starks on 06/07/21 for further evaluation and recommendation regarding her treatment options. Physical exam performed during this visit noted a a palpable fullness in the outer aspect of the central left breast. No palpable mass was appreciated in the right breast. Physical exam also revealed no palpable axillary, supraclavicular, or cervical lymphadenopathy. The patient was also noted to have symmetrically dense breast tissue. Given the density of her breasts, Dr. Marlou Starks recommended further evaluation via MRI prior to making a decision for surgery. ? ?Bilateral breast MRI performed on 06/20/21 showed the biopsy proven malignancy in the central to slightly lateralleft breast measuring approximately 3.6 cm (compared to 2.6 cm sonographically). No additional suspicious masses were seen in the left or right breasts, and no abnormal lymph nodes were appreciated in the left axilla. ? ?I personally reviewed her imaging with her.  ? ?Of note: She has family history of breast cancer in 3 paternal aunts, and 1 maternal aunt. ? ? ?Lymphedema issues, if any:  None; Had PT evaluation 06/20/2021 ? ?Pain issues, if any:  Reports consistent discomfort to left breast since biopsy, also reports sharp/shooting pain to 12 o'clock area of left breast. Has some pain to her left shoulder when she crosses her left arm over her body. Constant lower back pain ? ?SAFETY ISSUES: ?Prior radiation? No ?Pacemaker/ICD?  No ?Possible current pregnancy? No--hysterectomy ?Is the patient on methotrexate? No ? ?Current Complaints / other details:  Reports constant fatigue that often makes it difficult to stay focused while at work or doing non-physical tasks ?   ? ?PREVIOUS RADIATION THERAPY: No ? ?PAST MEDICAL HISTORY:  has a past medical history of Breast cancer ([place]), GERD (gastroesophageal reflux disease), and Psoriasis.   ? ?PAST SURGICAL HISTORY: ?Past Surgical History:  ?Procedure Laterality Date  ? ABDOMINAL HYSTERECTOMY    ? partial  ? BREAST BIOPSY Left 05/26/2021  ? ? ?FAMILY HISTORY: family history includes Breast cancer in her cousin, cousin, and cousin; Gastric cancer in her paternal uncle; Leukemia in her paternal uncle; Prostate cancer in her paternal uncle and paternal uncle; Skin cancer in her maternal grandmother. ? ?SOCIAL HISTORY:  reports that she has never smoked. She has never used smokeless tobacco. She reports that she does not drink alcohol and does not use drugs. ? ?ALLERGIES: Shellfish allergy, Gluten meal, and Latex ? ?MEDICATIONS:  ?Current Outpatient Medications  ?Medication Sig Dispense Refill  ? aspirin-acetaminophen-caffeine (EXCEDRIN MIGRAINE) 250-250-65 MG tablet Take 1 tablet by mouth every 6 (six) hours as needed for headache.    ? Bioflavonoid Products (QUERCETIN COMPLEX IMMUNE) CAPS Take 1 packet by mouth See admin instructions.    ? calcipotriene (DOVONOX) 0.005 % ointment Apply topically 2 (two) times daily.    ? Misc Natural Products (CORDYMAX CS-4) 525 MG CAPS Take 1 packet by mouth See admin instructions.    ? Multiple Vitamins-Minerals (MULTIVITAMIN PO) Take 1 tablet by mouth daily.    ? omeprazole (PRILOSEC) 20 MG capsule Take 1 capsule by mouth 2 (two) times a week.    ? pimecrolimus (ELIDEL) 1 % cream Apply topically 2 (two) times daily.    ? tacrolimus (PROTOPIC) 0.1 % ointment Apply topically daily. To affected areas of psoriasis and eczema. 100 g 0  ? Tragacanth (ASTRAGALUS ROOT) POWD  Take 1 Units by mouth See admin instructions.    ? ?No current facility-administered medications for this encounter.  ? ? ?REVIEW OF SYSTEMS: As above in HPI. ?  ?PHYSICAL EXAM:  height is $RemoveB'5\' 10"'zzDgDoWo$  (1.778 m) and weight is 204 lb 3.2 oz (92.6 kg). Her temperature is 97.5 ?F (36.4 ?C) (abnormal). Her blood pressure is 120/71 and her pulse is 78. Her respiration is 18 and oxygen saturation is 100%.   ?General: Alert and oriented, in no acute distress ?HEENT: Head is normocephalic. Extraocular movements are intact.   ?Heart: Regular in rate and rhythm with no murmurs, rubs, or gallops. ?Chest: Clear to auscultation bilaterally, with no rhonchi, wheezes, or rales. ?Abdomen: Soft, nontender, nondistended, with no rigidity or guarding. ?Extremities: No cyanosis or edema. ?Skin: No concerning lesions. ?Musculoskeletal: symmetric strength and muscle tone throughout. ?Neurologic: Cranial nerves II through XII are grossly intact. No obvious focalities. Speech is fluent. Coordination is intact. ?Psychiatric: Judgment and insight are intact. Affect is appropriate. ?Breasts: soreness to exam in left breast. Mild thickening in central left breast without obvious mass (could not palpate deeply due to pain) . No other palpable masses appreciated in the breasts or axillae b/l . ? ? ? ?ECOG = 0 ? ?0 - Asymptomatic (Fully active, able  to carry on all predisease activities without restriction) ? ?1 - Symptomatic but completely ambulatory (Restricted in physically strenuous activity but ambulatory and able to carry out work of a light or sedentary nature. For example, light housework, office work) ? ?2 - Symptomatic, <50% in bed during the day (Ambulatory and capable of all self care but unable to carry out any work activities. Up and about more than 50% of waking hours) ? ?3 - Symptomatic, >50% in bed, but not bedbound (Capable of only limited self-care, confined to bed or chair 50% or more of waking hours) ? ?4 - Bedbound (Completely  disabled. Cannot carry on any self-care. Totally confined to bed or chair) ? ?5 - Death ? ? Oken MM, Creech RH, Tormey DC, et al. [phone]). "Toxicity and response criteria of the [facility]".

## 2021-06-21 NOTE — Progress Notes (Signed)
Location of Breast Cancer:  ?Malignant neoplasm of upper-outer quadrant of left breast in female, estrogen receptor positive  ? ?Histology per Pathology Report:  ?(Definitive pathology pending eventual surgery) ?05/26/2021 ?Diagnosis ?1. Breast, left, needle core biopsy, left breast 3 ocl, 4 cmfn ribbon ?- INVASIVE DUCTAL ADENOCARCINOMA, GRADE 2 (3+3+1) ?- DUCTAL CARCINOMA IN SITU, INTERMEDIATE TO HIGH NUCLEAR GRADE, SOLID TYPE WITHOUT NECROSIS ?2. Breast, left, needle core biopsy, left breast 4 ocl, 8 cmfn coil ?- BENIGN BREAST WITH NONPROLIFERATIVE FIBROCYSTIC CHANGES INCLUDING ADENOSIS AND CYSTIC APOCRINE METAPLASIA ?- NEGATIVE FOR MICROCALCIFICATIONS ?- NEGATIVE FOR CARCINOMA ?3. Lymph node, needle/core biopsy, left axilla hydromark ?- BENIGN FIBROADIPOSE ?- NEGATIVE FOR LYMPH NODE ? ?Receptor Status: ER(15%), PR (Negative), Her2-neu (Negative via FISH), Ki-67(<5%) ? ?Did patient present with symptoms (if so, please note symptoms) or was this found on screening mammography?: Patient recently went for a routine screening mammogram. At that time she was found to have a 2.6 cm mass in the upper outer aspect of the left breast. There was 1 abnormal looking lymph node but it was very close to the vessels and could not be sampled adequately.   ? ?Past/Anticipated interventions by surgeon, if any:  ?06/07/2021 ?--Dr. Paul Toth (office visit) ?The patient appears to have a 2.6 cm cancer in the outer aspect of the central left breast with 1 questionable lymph node.  ?I have discussed with her in detail the different options for treatment and at this point she is undecided between lumpectomy and mastectomy.  ?She will be a candidate for sentinel node biopsy but will likely need a targeted node dissection of the 1 abnormal lymph node since it could not be sampled.  ?We will plan to evaluate her with MRI given the density of the breast.  ?I will refer her to medical and radiation oncology to discuss adjuvant therapy as well  as physical therapy for preoperative lymphedema testing.  ?She may need a genetics evaluation as well.  ?We will call her with the results of her MRI and then finalize a plan for surgery.  ? ?Past/Anticipated interventions by medical oncology, if any:  ?Scheduled for consultation with Dr. Praveena Iruku on 06/30/2021 ? ?Lymphedema issues, if any:  None; Had PT evaluation 06/20/2021 ? ?Pain issues, if any:  Reports consistent discomfort to left breast since biopsy, also reports sharp/shooting pain to 12 o'clock area of left breast. Has some pain to her left shoulder when she crosses her left arm over her body. Constant lower back pain ? ?SAFETY ISSUES: ?Prior radiation? No ?Pacemaker/ICD? No ?Possible current pregnancy? No--hysterectomy ?Is the patient on methotrexate? No ? ?Current Complaints / other details:  Reports constant fatigue that often makes it difficult to stay focused while at work or doing non-physical tasks ?   ? ? ? ?

## 2021-06-21 NOTE — Telephone Encounter (Signed)
4/18 @ 10:57 am Left voicemail as a reminder of pt's appt for tomorrow.   ?

## 2021-06-22 ENCOUNTER — Ambulatory Visit
Admission: RE | Admit: 2021-06-22 | Discharge: 2021-06-22 | Disposition: A | Discharge status: Home / Self Care | Payer: 59 | Source: Ambulatory Visit | Attending: Radiation Oncology | Admitting: Radiation Oncology

## 2021-06-22 ENCOUNTER — Encounter: Payer: Self-pay | Admitting: Radiation Oncology

## 2021-06-22 ENCOUNTER — Other Ambulatory Visit: Payer: Self-pay

## 2021-06-22 VITALS — BP 120/71 | HR 78 | Temp 97.5°F | Resp 18 | Ht 70.0 in | Wt 204.2 lb

## 2021-06-22 DIAGNOSIS — Z806 Family history of leukemia: Secondary | ICD-10-CM | POA: Insufficient documentation

## 2021-06-22 DIAGNOSIS — K219 Gastro-esophageal reflux disease without esophagitis: Secondary | ICD-10-CM | POA: Diagnosis not present

## 2021-06-22 DIAGNOSIS — C50412 Malignant neoplasm of upper-outer quadrant of left female breast: Secondary | ICD-10-CM | POA: Diagnosis present

## 2021-06-22 DIAGNOSIS — F329 Major depressive disorder, single episode, unspecified: Secondary | ICD-10-CM | POA: Insufficient documentation

## 2021-06-22 DIAGNOSIS — R5383 Other fatigue: Secondary | ICD-10-CM | POA: Insufficient documentation

## 2021-06-22 DIAGNOSIS — L409 Psoriasis, unspecified: Secondary | ICD-10-CM | POA: Diagnosis not present

## 2021-06-22 DIAGNOSIS — F419 Anxiety disorder, unspecified: Secondary | ICD-10-CM | POA: Insufficient documentation

## 2021-06-22 DIAGNOSIS — Z170 Estrogen receptor positive status [ER+]: Secondary | ICD-10-CM

## 2021-06-22 DIAGNOSIS — Z800 Family history of malignant neoplasm of digestive organs: Secondary | ICD-10-CM | POA: Insufficient documentation

## 2021-06-22 DIAGNOSIS — Z803 Family history of malignant neoplasm of breast: Secondary | ICD-10-CM | POA: Insufficient documentation

## 2021-06-22 DIAGNOSIS — G479 Sleep disorder, unspecified: Secondary | ICD-10-CM | POA: Insufficient documentation

## 2021-06-22 HISTORY — DX: Gastro-esophageal reflux disease without esophagitis: K21.9

## 2021-06-22 HISTORY — DX: Malignant neoplasm of unspecified site of unspecified female breast: C50.919

## 2021-06-30 ENCOUNTER — Other Ambulatory Visit: Payer: Self-pay

## 2021-06-30 ENCOUNTER — Encounter: Payer: Self-pay | Admitting: Hematology and Oncology

## 2021-06-30 ENCOUNTER — Inpatient Hospital Stay: Payer: 59 | Attending: Hematology and Oncology | Admitting: Hematology and Oncology

## 2021-06-30 ENCOUNTER — Inpatient Hospital Stay: Payer: 59

## 2021-06-30 VITALS — BP 138/85 | HR 79 | Temp 98.4°F | Resp 16 | Ht 70.0 in | Wt 203.1 lb

## 2021-06-30 DIAGNOSIS — C50412 Malignant neoplasm of upper-outer quadrant of left female breast: Secondary | ICD-10-CM | POA: Insufficient documentation

## 2021-06-30 DIAGNOSIS — Z170 Estrogen receptor positive status [ER+]: Secondary | ICD-10-CM | POA: Insufficient documentation

## 2021-06-30 DIAGNOSIS — Z800 Family history of malignant neoplasm of digestive organs: Secondary | ICD-10-CM | POA: Insufficient documentation

## 2021-06-30 DIAGNOSIS — Z803 Family history of malignant neoplasm of breast: Secondary | ICD-10-CM | POA: Insufficient documentation

## 2021-06-30 NOTE — Assessment & Plan Note (Signed)
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  Patient arrived to the appointment today by herself.  She mentions that her breast has been changing for the past year approximately.  I discussed with Dr. Melina Copa who checked back on the prognostics and agrees that this is a slow-growing tumor with low proliferation index with moderate staining in the estrogen receptor, likely 15% or greater but otherwise PR and HER2 negative.  She does not believe that the tumor might respond to chemotherapy very well given the low proliferation index. ?Given this information, I think its reasonable to proceed with surgery upfront followed by oncotype testing and adjuvant chemotherapy.  ?We have discussed the following details about oncotype. We have discussed about Oncotype Dx score which is a well validated prognostic scoring system which can predict outcome with endocrine therapy alone and whether chemotherapy reduces recurrence.  Typically in patients with ER positive cancers that are node negative if the RS score is high typically greater than or equal to 26, chemotherapy is recommended.  ?In women with intermediate recurrence score younger than 8, there can still be some role for chemotherapy in addition to endocrine therapy especially if the recurrence score is between 21-25. ?If chemotherapy is needed, this will precede radiation and then after radiation she will continue on antiestrogen therapy. ? ?We didn't focus on anti estrogen therapy today, We will discuss about this when she returns for follow up. We will plan repeat prognostics on final pathology specimen. ? ? ?

## 2021-06-30 NOTE — Progress Notes (Signed)
Dr. Gilberto Better is [place] ?CONSULT NOTE ? ?Patient Care Team: ?Katherina Mires, MD as PCP - General (Family Medicine) ? ?CHIEF COMPLAINTS/PURPOSE OF CONSULTATION:  ?Newly diagnosed breast cancer ? ?HISTORY OF PRESENTING ILLNESS:  ?Darlene Peters 51 y.o. female is here because of recent diagnosis of left breast cancer ? ?I reviewed her records extensively and collaborated the history with the patient. ? ?SUMMARY OF ONCOLOGIC HISTORY: ?Oncology History  ? No history exists.  ? ?Patient started experiencing some change in contour in the lateral aspect of left breast along with some palpable lumps and focal pain in the left axilla and also has significant family history of breast cancer hence underwent diagnostic mammogram.  This showed a highly suspicious mass in the left breast at 3:00 measuring 2.6 cm, there is a 0.6 cm mass in the left breast at 4:00 favored to represent a complicated cyst.  There is a 0.6 cm mass in the left breast at 2:00 likely benign cyst.  There is 1 left axillary lymph node with a prominent cortex of 4 mm.  No evidence of right breast malignancy. ? 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  Rest of the biopsies showed benign breast tissue.  Left axillary lymph node biopsy also showed benign fibroadipose tissue, negative for lymph node.  Prognostics from the left breast showed ER 15% positive, moderate staining, PR 0%, negative, KI less than 5% and HER2 negative ? ?She was seen by Dr. Autumn Messing on 06/07/2021 and recommendation was to consider lumpectomy and sentinel lymph node biopsy.  She is also referred to medical and radiation oncology for additional recommendations ? ?She noticed some breast changes in the left breast in the past yr, she describes it as a dent in the breast and the nipple appears pulled. She wasn't able to feel the actual breast mass except  the cysts. She also has noticed low back pain, attributed this to a fall last yr. She has first cousins with breast cancer, one uncle died of throat cancer, another uncle had prostate cancer. She was worried because cancer runs in the family. ? ?MRI breast 06/20/2021 showed approximately 3.6 cms on MRI, biopsy proven malignancy in central to slightly lateral left breast. No abnormal lymph nodes in the left axilla. ? ?She works at [employer] for [employer]. Husband is estranged. She has 4 kids, one daughter and three son. ?Daughters are 92, sons are 25, 41 and 74. She has 4 grandchildren. ?Oldest son lives here.   ? ?MEDICAL HISTORY:  ?Past Medical History:  ?Diagnosis Date  ? Breast cancer ([place])   ? GERD (gastroesophageal reflux disease)   ? Psoriasis   ? ? ?SURGICAL HISTORY: ?Past Surgical History:  ?Procedure Laterality Date  ? ABDOMINAL HYSTERECTOMY    ? partial  ? BREAST BIOPSY Left 05/26/2021  ? ? ?SOCIAL HISTORY: ?Social History  ? ?Socioeconomic History  ? Marital status: Married  ?  Spouse name: Not on file  ? Number of children: Not on file  ? Years of education: Not on file  ? Highest education level: Not on file  ?Occupational History  ? Not on file  ?Tobacco Use  ? Smoking status: Never  ? Smokeless tobacco: Never  ?Vaping Use  ? Vaping Use: Never used  ?Substance and Sexual Activity  ? Alcohol use: No  ? Drug use: No  ? Sexual activity: Yes  ?  Birth control/protection: Surgical  ?Other Topics Concern  ? Not on file  ?Social History Narrative  ? Not on file  ? ?Social Determinants of Health  ? ?Financial Resource Strain: Not on file  ?Food Insecurity: Not on file  ?Transportation Needs: Not on file  ?Physical Activity: Not on file  ?Stress: Not on file  ?Social Connections: Not on file  ?Intimate Partner Violence: Not on file  ? ? ?FAMILY HISTORY: ?Family History  ?Problem Relation Age of Onset  ? Prostate cancer Paternal Uncle   ? Leukemia Paternal Uncle   ? Prostate cancer Paternal Uncle   ? Gastric cancer  Paternal Uncle   ? Skin cancer Maternal Grandmother   ? Breast cancer Cousin   ? Breast cancer Cousin   ? Breast cancer Cousin   ? ? ?ALLERGIES:  is allergic to shellfish allergy, gluten meal, and latex. ? ?MEDICATIONS:  ?Current Outpatient Medications  ?Medication Sig Dispense Refill  ? aspirin-acetaminophen-caffeine (EXCEDRIN MIGRAINE) 250-250-65 MG tablet Take 1 tablet by mouth every 6 (six) hours as needed for headache.    ? Bioflavonoid Products (QUERCETIN COMPLEX IMMUNE) CAPS Take 1 packet by mouth See admin instructions.    ? calcipotriene (DOVONOX) 0.005 % ointment Apply topically 2 (two) times daily.    ? Misc Natural Products (CORDYMAX CS-4) 525 MG CAPS Take 1 packet by mouth See admin instructions.    ? Multiple Vitamins-Minerals (MULTIVITAMIN PO) Take 1 tablet by mouth daily.    ? omeprazole (PRILOSEC) 20 MG capsule Take 1 capsule by mouth 2 (two) times a week.    ? pimecrolimus (ELIDEL) 1 % cream Apply topically 2 (two) times daily.    ? tacrolimus (PROTOPIC) 0.1 % ointment Apply topically daily. To affected areas of psoriasis and eczema. 100 g 0  ? Tragacanth (ASTRAGALUS ROOT) POWD Take 1 Units by mouth See admin instructions.    ? ?No current facility-administered medications for this visit.  ? ? ?REVIEW OF SYSTEMS:   ?Constitutional: Denies fevers, chills or abnormal night sweats ?Eyes: Denies blurriness of vision, double vision or watery eyes ?Ears, nose, mouth, throat, and face: Denies mucositis or sore throat ?Respiratory: Denies cough, dyspnea or wheezes ?Cardiovascular: Denies palpitation, chest discomfort or lower extremity swelling ?Gastrointestinal:  Denies nausea, heartburn or change in bowel habits ?Skin: Denies abnormal skin rashes ?Lymphatics: Denies new lymphadenopathy or easy bruising ?Neurological:Denies numbness, tingling or new weaknesses ?Behavioral/Psych: Mood is stable, no new changes  ?Breast: Noticed the dent in the breast along with some nipple retraction in the left  breast ?All other systems were reviewed with the patient and are negative. ? ?PHYSICAL EXAMINATION: ?ECOG PERFORMANCE STATUS: 0 - Asymptomatic ? ?Vitals:  ? 06/30/21 1004  ?BP: 138/85  ?Pulse: 79  ?Resp: 16  ?Temp: 98.4 ?F (36.9 ?C)  ?SpO2: 100%  ? ?Filed Weights  ? 06/30/21 1004  ?Weight: 203 lb 1.6 oz (92.1 kg)  ? ? ?GENERAL:alert, no distress and comfortable ?SKIN: skin color, texture, turgor are normal, no rashes or significant lesions ?EYES: normal, conjunctiva are pink and non-injected, sclera clear ?OROPHARYNX:no exudate, no erythema and lips, buccal mucosa, and tongue normal  ?NECK: supple, thyroid normal size, non-tender, without nodularity ?LYMPH:  no palpable lymphadenopathy in the cervical, axillary or inguinal ?LUNGS: clear to auscultation and percussion with normal breathing effort ?HEART: regular rate & rhythm and no murmurs and no lower extremity edema ?ABDOMEN:abdomen soft, non-tender and normal bowel sounds ?Musculoskeletal:no cyanosis of digits and no clubbing  ?PSYCH: alert & oriented x 3  with fluent speech ?NEURO: no focal motor/sensory deficits ?BREAST:Ill defined breast mass at 3 0 clock position, size couldn't be estimated, patient says area is too tender. ?Other cysts noted in the breast. No regional adenopathy ? ?LABORATORY DATA:  ?I have reviewed the data as listed ?Lab Results  ?Component Value Date  ? WBC 6.1 04/28/2018  ? HGB 14.0 04/28/2018  ? HCT 41.5 04/28/2018  ? MCV 93.5 04/28/2018  ? PLT 159 04/28/2018  ? ?Lab Results  ?Component Value Date  ? NA 137 04/28/2018  ? K 3.7 04/28/2018  ? CL 106 04/28/2018  ? CO2 25 04/28/2018  ? ? ?RADIOGRAPHIC STUDIES: ?I have personally reviewed the radiological reports and agreed with the findings in the report. ? ?ASSESSMENT AND PLAN:  ?Malignant neoplasm of upper-outer quadrant of left breast in female, estrogen receptor positive ([place]) ?This is a 51 year old female patient with newly diagnosed left breast invasive ductal carcinoma, grade 2, ER  15% moderate staining, PR negative, HER2 negative, KI of 2% referred to medical oncology for adjuvant recommendations.  Patient arrived to the appointment today by herself.  She mentions that her breast has been

## 2021-07-01 ENCOUNTER — Telehealth: Payer: Self-pay | Admitting: *Deleted

## 2021-07-01 ENCOUNTER — Encounter: Payer: Self-pay | Admitting: *Deleted

## 2021-07-01 ENCOUNTER — Ambulatory Visit: Payer: Self-pay | Admitting: General Surgery

## 2021-07-01 DIAGNOSIS — C50412 Malignant neoplasm of upper-outer quadrant of left female breast: Secondary | ICD-10-CM

## 2021-07-01 NOTE — Telephone Encounter (Signed)
Spoke with patient to follow up from her new patient appt and discuss navigation resources. Contact information given. Informed her that per Dr. Chryl Heck she will do sx 1st then we will repeat her prognostics on her final pathology and order oncotype accordingly. Patient verbalized understanding.  ?

## 2021-07-05 ENCOUNTER — Encounter: Payer: Self-pay | Admitting: *Deleted

## 2021-07-05 ENCOUNTER — Other Ambulatory Visit: Payer: Self-pay | Admitting: General Surgery

## 2021-07-05 DIAGNOSIS — Z170 Estrogen receptor positive status [ER+]: Secondary | ICD-10-CM

## 2021-07-11 ENCOUNTER — Inpatient Hospital Stay: Payer: 59 | Attending: Hematology and Oncology | Admitting: Hematology and Oncology

## 2021-07-11 ENCOUNTER — Other Ambulatory Visit: Payer: Self-pay

## 2021-07-11 DIAGNOSIS — C50412 Malignant neoplasm of upper-outer quadrant of left female breast: Secondary | ICD-10-CM | POA: Insufficient documentation

## 2021-07-11 DIAGNOSIS — Z803 Family history of malignant neoplasm of breast: Secondary | ICD-10-CM | POA: Diagnosis not present

## 2021-07-11 DIAGNOSIS — Z800 Family history of malignant neoplasm of digestive organs: Secondary | ICD-10-CM | POA: Insufficient documentation

## 2021-07-11 DIAGNOSIS — Z170 Estrogen receptor positive status [ER+]: Secondary | ICD-10-CM | POA: Diagnosis present

## 2021-07-11 NOTE — Progress Notes (Signed)
Dr. Gilberto Better is [place] ?CONSULT NOTE ? ?Patient Care Team: ?Katherina Mires, MD as PCP - General (Family Medicine) ?Rockwell Germany, RN as Oncology Nurse Navigator ?Mauro Kaufmann, RN as Oncology Nurse Navigator ?Benay Pike, MD as Consulting Physician (Hematology and Oncology) ?Jovita Kussmaul, MD as Consulting Physician (General Surgery) ?Eppie Gibson, MD as Attending Physician (Radiation Oncology) ? ?CHIEF COMPLAINTS/PURPOSE OF CONSULTATION:  ?Newly diagnosed breast cancer ? ?HISTORY OF PRESENTING ILLNESS:  ?Darlene Peters 51 y.o. female is here because of recent diagnosis of left breast cancer ? ?I reviewed her records extensively and collaborated the history with the patient. ? ?SUMMARY OF ONCOLOGIC HISTORY: ?Oncology History  ? No history exists.  ? ?Patient started experiencing some change in contour in the lateral aspect of left breast along with some palpable lumps and focal pain in the left axilla and also has significant family history of breast cancer hence underwent diagnostic mammogram.  This showed a highly suspicious mass in the left breast at 3:00 measuring 2.6 cm, there is a 0.6 cm mass in the left breast at 4:00 favored to represent a complicated cyst.  There is a 0.6 cm mass in the left breast at 2:00 likely benign cyst.  There is 1 left axillary lymph node with a prominent cortex of 4 mm.  No evidence of right breast malignancy. ? 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  Rest of the biopsies showed benign breast tissue.  Left axillary lymph node biopsy also showed benign fibroadipose tissue, negative for lymph node.  Prognostics from the left breast showed ER 15% positive, moderate staining, PR 0%, negative, KI less than 5% and HER2 negative ? ?She was seen by Dr. Autumn Messing on 06/07/2021 and recommendation was to consider lumpectomy and sentinel lymph  node biopsy.  She is also referred to medical and radiation oncology for additional recommendations ? ?She noticed some breast changes in the left breast in the past yr, she describes it as a dent in the breast and the nipple appears pulled. She wasn't able to feel the actual breast mass except the cysts. She also has noticed low back pain, attributed this to a fall last yr. She has first cousins with breast cancer, one uncle died of throat cancer, another uncle had prostate cancer. She was worried because cancer runs in the family. ? ?MRI breast 06/20/2021 showed approximately 3.6 cms on MRI, biopsy proven malignancy in central to slightly lateral left breast. No abnormal lymph nodes in the left axilla. ? ?She works at [employer] for [employer]. Husband is estranged. She has 4 kids, one daughter and three son. ?Daughters are 15, sons are 84, 14 and 43. She has 4 grandchildren. ?Oldest son lives here.   ? ?Interval History ? ?Patient arrived to the appointment today for follow-up.  We have previously asked her to return to clinic after surgery however she apparently schedule follow-up for 2 weeks instead of 2 weeks after surgery.  She initially had a few questions and she is dealing with the emotional toll of being diagnosed with cancer.  She complains of a skin rash and lump on left elbow which has been starting to bother her. ? ?She is scheduled for surgery on 5/31.  She was also requesting a refill for tacrolimus cream which has been prescribed for her skin rash by the dermatologist. ? ?MEDICAL HISTORY:  ?Past Medical  History:  ?Diagnosis Date  ? Breast cancer ([place])   ? GERD (gastroesophageal reflux disease)   ? Psoriasis   ? ? ?SURGICAL HISTORY: ?Past Surgical History:  ?Procedure Laterality Date  ? ABDOMINAL HYSTERECTOMY    ? partial  ? BREAST BIOPSY Left 05/26/2021  ? ? ?SOCIAL HISTORY: ?Social History  ? ?Socioeconomic History  ? Marital status: Married  ?  Spouse name: Not on file  ? Number of children: Not on  file  ? Years of education: Not on file  ? Highest education level: Not on file  ?Occupational History  ? Not on file  ?Tobacco Use  ? Smoking status: Never  ? Smokeless tobacco: Never  ?Vaping Use  ? Vaping Use: Never used  ?Substance and Sexual Activity  ? Alcohol use: No  ? Drug use: No  ? Sexual activity: Yes  ?  Birth control/protection: Surgical  ?Other Topics Concern  ? Not on file  ?Social History Narrative  ? Not on file  ? ?Social Determinants of Health  ? ?Financial Resource Strain: Not on file  ?Food Insecurity: Not on file  ?Transportation Needs: Not on file  ?Physical Activity: Not on file  ?Stress: Not on file  ?Social Connections: Not on file  ?Intimate Partner Violence: Not on file  ? ? ?FAMILY HISTORY: ?Family History  ?Problem Relation Age of Onset  ? Prostate cancer Paternal Uncle   ? Leukemia Paternal Uncle   ? Prostate cancer Paternal Uncle   ? Gastric cancer Paternal Uncle   ? Skin cancer Maternal Grandmother   ? Breast cancer Cousin   ? Breast cancer Cousin   ? Breast cancer Cousin   ? ? ?ALLERGIES:  is allergic to shellfish allergy, gluten meal, and latex. ? ?MEDICATIONS:  ?Current Outpatient Medications  ?Medication Sig Dispense Refill  ? aspirin-acetaminophen-caffeine (EXCEDRIN MIGRAINE) 250-250-65 MG tablet Take 1 tablet by mouth every 6 (six) hours as needed for headache.    ? Bioflavonoid Products (QUERCETIN COMPLEX IMMUNE) CAPS Take 1 packet by mouth See admin instructions.    ? calcipotriene (DOVONOX) 0.005 % ointment Apply topically 2 (two) times daily.    ? Misc Natural Products (CORDYMAX CS-4) 525 MG CAPS Take 1 packet by mouth See admin instructions.    ? Multiple Vitamins-Minerals (MULTIVITAMIN PO) Take 1 tablet by mouth daily.    ? omeprazole (PRILOSEC) 20 MG capsule Take 1 capsule by mouth 2 (two) times a week.    ? pimecrolimus (ELIDEL) 1 % cream Apply topically 2 (two) times daily.    ? tacrolimus (PROTOPIC) 0.1 % ointment Apply topically daily. To affected areas of psoriasis  and eczema. 100 g 0  ? Tragacanth (ASTRAGALUS ROOT) POWD Take 1 Units by mouth See admin instructions.    ? ?No current facility-administered medications for this visit.  ? ? ?REVIEW OF SYSTEMS:   ?Constitutional: Denies fevers, chills or abnormal night sweats ?Eyes: Denies blurriness of vision, double vision or watery eyes ?Ears, nose, mouth, throat, and face: Denies mucositis or sore throat ?Respiratory: Denies cough, dyspnea or wheezes ?Cardiovascular: Denies palpitation, chest discomfort or lower extremity swelling ?Gastrointestinal:  Denies nausea, heartburn or change in bowel habits ?Skin: Denies abnormal skin rashes ?Lymphatics: Denies new lymphadenopathy or easy bruising ?Neurological:Denies numbness, tingling or new weaknesses ?Behavioral/Psych: Mood is stable, no new changes  ?Breast: Noticed the dent in the breast along with some nipple retraction in the left breast ?All other systems were reviewed with the patient and are negative. ? ?PHYSICAL EXAMINATION: ?ECOG  PERFORMANCE STATUS: 0 - Asymptomatic ? ?Vitals:  ? 07/11/21 1300  ?BP: 130/88  ?Pulse: 88  ?Temp: 97.9 ?F (36.6 ?C)  ?SpO2: 100%  ? ?Filed Weights  ? 07/11/21 1300  ?Weight: 199 lb 9.6 oz (90.5 kg)  ? ? ?Physical exam deferred training of counseling ?LABORATORY DATA:  ?I have reviewed the data as listed ?Lab Results  ?Component Value Date  ? WBC 6.1 04/28/2018  ? HGB 14.0 04/28/2018  ? HCT 41.5 04/28/2018  ? MCV 93.5 04/28/2018  ? PLT 159 04/28/2018  ? ?Lab Results  ?Component Value Date  ? NA 137 04/28/2018  ? K 3.7 04/28/2018  ? CL 106 04/28/2018  ? CO2 25 04/28/2018  ? ? ?RADIOGRAPHIC STUDIES: ?I have personally reviewed the radiological reports and agreed with the findings in the report. ? ?ASSESSMENT AND PLAN:  ?Malignant neoplasm of upper-outer quadrant of left breast in female, estrogen receptor positive ([place]) ?This is a 51 year old female patient with newly diagnosed left breast invasive ductal carcinoma, grade 2, ER 15% moderate  staining, PR negative, HER2 negative, KI of 2% referred to medical oncology for adjuvant recommendations.  Patient arrived to the appointment today by herself.  She mentions that her breast has been changing fo

## 2021-07-11 NOTE — Assessment & Plan Note (Signed)
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  Patient arrived to the appointment today by herself.  She mentions that her breast has been changing for the past year approximately.  I discussed with Dr. Melina Copa who checked back on the prognostics and agrees that this is a slow-growing tumor with low proliferation index with moderate staining in the estrogen receptor, likely 15% or greater but otherwise PR and HER2 negative.  She does not believe that the tumor might respond to chemotherapy very well given the low proliferation index.  Given this information, have discussed with the patient and asked her to come back about 2 weeks after surgery.  However she apparently got confused and schedule a follow-up for 2 weeks from her last visit.  At this time there are no new recommendations.  We once again talked about considering Oncotype followed by adjuvant chemotherapy if her risk of recurrence is considered high and if there is added benefit from chemotherapy.  She also understands the role of adjuvant radiation and antiestrogen therapy. ? ?I do not believe the lump on the elbow is malignant.  She however wants to have this removed.  She can certainly run it by the surgeon to see if she can have this removed. ? ?Also for the skin rash, she was requesting refill of the tacrolimus cream.  She was recommended to call her dermatologist if she is even allowed to continue this medication because she does not have any refills from them.  She expressed understanding ?

## 2021-07-14 ENCOUNTER — Telehealth: Payer: Self-pay | Admitting: Licensed Clinical Social Worker

## 2021-07-14 ENCOUNTER — Encounter: Payer: Self-pay | Admitting: Licensed Clinical Social Worker

## 2021-07-14 NOTE — Telephone Encounter (Signed)
[facility] ?Clinical Social Work ? ?Clinical Social Work was referred by  new patient protocol  for assessment of psychosocial needs.  Clinical Social Worker  attempted to contact pt by phone   to offer support and assess for needs.   ?No answer. Left VM with direct contact information. ? ? ? ? ?Wilhelmenia Addis E Eriyah Fernando, LCSW  ?Clinical Social Worker ?[facility] ?      ? ?

## 2021-07-14 NOTE — Progress Notes (Signed)
[facility] Clinical Social Work  ?Initial Assessment ? ? ?Darlene Peters is a 51 y.o. year old female contacted by phone. Clinical Social Work was referred by  new patient protocol  for assessment of psychosocial needs.  ? ?SDOH (Social Determinants of Health) assessments performed: Yes ?SDOH Interventions   ? ?Flowsheet Row Most Recent Value  ?SDOH Interventions   ?Financial Strain Interventions Other (Comment)  [cancer foundations, local resources]  ?Housing Interventions Other (Comment)  Chief Executive Officer, local resources]  ?Transportation Interventions Intervention Not Indicated  ? ?  ?  ?Distress Screen completed: No ?   ? View : No data to display.  ?  ?  ?  ? ? ? ? ?Family/Social Information:  ?Housing Arrangement: Patient living in home with husband who is estranged. Complicated situation with living and paying bills ?Family members/support persons in your life? Family- 3 of 4 kids live in [place], mom and sisters in [place] ?Transportation concerns: no  ?Employment: Working full time for [employer] but currently on [employer]. Income source: Employment ?Financial concerns: Yes, current concerns ?Type of concern: Utilities, Transportation, and Medical bills ?Food access concerns: no ?Religious or spiritual practice: yes ?Services Currently in place:  FMLA ? ?Coping/ Adjustment to diagnosis: ?Patient understands treatment plan and what happens next? yes, having a difficult time adjusting emotionally and worried about costs ?Concerns about diagnosis and/or treatment: Relationship with husband or partner and How I will pay for the services I need. Out of pocket maximum is $4000 ?Patient reported stressors: Finances, Partner, and Adjusting to my illness ?Patient enjoys  not addressed ?Current coping skills/ strengths: Motivation for treatment/growth , Supportive family/friends , and Other: able to ask for help ? ? ? SUMMARY: ?Current SDOH Barriers:  ?Financial constraints related to illness and Family and relationship  dysfunction ? ?Clinical Social Work Clinical Goal(s):  ?Patient will increase emotional wellbeing and adjustment to cancer diagnosis ?Patient will work with CSW to decrease financial strain ? ?Interventions: ?Discussed common feeling and emotions when being diagnosed with cancer, and the importance of support during treatment ?Informed patient of the support team roles and support services at [facility] ?Provided CSW contact information and encouraged patient to call with any questions or concerns ?Briefly discussed potential avenues for financial assistance ? ? ?Follow Up Plan: Patient will come to support center 5/16  at 2pm to further discuss financial resources and initiate counseling ?Patient verbalizes understanding of plan: Yes ? ? ? ?Darlene Staff E Huel Centola, LCSW ?

## 2021-07-18 ENCOUNTER — Inpatient Hospital Stay (HOSPITAL_BASED_OUTPATIENT_CLINIC_OR_DEPARTMENT_OTHER): Payer: 59 | Admitting: Genetic Counselor

## 2021-07-18 ENCOUNTER — Other Ambulatory Visit: Payer: Self-pay

## 2021-07-18 ENCOUNTER — Inpatient Hospital Stay: Payer: 59 | Admitting: Licensed Clinical Social Worker

## 2021-07-18 ENCOUNTER — Inpatient Hospital Stay: Payer: 59

## 2021-07-18 ENCOUNTER — Other Ambulatory Visit: Payer: Self-pay | Admitting: Genetic Counselor

## 2021-07-18 DIAGNOSIS — Z803 Family history of malignant neoplasm of breast: Secondary | ICD-10-CM

## 2021-07-18 DIAGNOSIS — C50412 Malignant neoplasm of upper-outer quadrant of left female breast: Secondary | ICD-10-CM

## 2021-07-18 DIAGNOSIS — Z8042 Family history of malignant neoplasm of prostate: Secondary | ICD-10-CM | POA: Diagnosis not present

## 2021-07-18 DIAGNOSIS — Z170 Estrogen receptor positive status [ER+]: Secondary | ICD-10-CM

## 2021-07-18 LAB — GENETIC SCREENING ORDER

## 2021-07-18 NOTE — Progress Notes (Signed)
[facility] CSW Progress Note ? ?Clinical Social Worker met with patient in office after appt with Dietitian. Pt expressed flashes of thoughts of "would it be better if I wasn't here". Originally had thoughts like this in 1994. Pt denies a plan or intent to actually harm self. 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 ? ?Pt has many stressors currently at home, with her son who lives locally, and now with cancer diagnosis. She is seeking additional support for her mental and emotional well-being. CSW provided information for crisis needs. Also referred pt to [facility] for counseling & psychiatry- intake is 5/24.  CSW will continue to follow for cancer-related support and help with financial assistance applications. Will see pt again tomorrow as planned to work on these. ?  ? ? ?Idan Prime E Shamela Haydon, LCSW  ?

## 2021-07-18 NOTE — Progress Notes (Signed)
REFERRING PROVIDER: ?Jovita Kussmaul, MD ?[facility] ?[address] ?[place],  [place] [address] ? ?PRIMARY PROVIDER:  ?Katherina Mires, MD ? ?PRIMARY REASON FOR VISIT:  ?Encounter Diagnoses  ?Name Primary?  ? Malignant neoplasm of upper-outer quadrant of left breast in female, estrogen receptor positive ([place]) Yes  ? Family history of breast cancer   ? Family history of prostate cancer   ? ? ? ?HISTORY OF PRESENT ILLNESS:   ?Ms. Vancamp, a 51 y.o. female, was seen for a [facility] cancer genetics consultation at the request of Dr. Marlou Starks due to a personal and family history of cancer.  Ms. Klee presents to clinic today to discuss the possibility of a hereditary predisposition to cancer, to discuss genetic testing, and to further clarify her future cancer risks, as well as potential cancer risks for family members.  ? ?In 2023, at the age of 45, Ms. Thull was diagnosed with invasive ductal carcinoma of the left breast (ER+ 15% moderate/PR-/HER2-). The treatment plan includes breast conserving surgery (scheduled 5/31), Oncotype DX to determine potential benefit of chemotherapy, adjuvant radiation, and anti-estrogens.  ? ? ?Past Medical History:  ?Diagnosis Date  ? Breast cancer ([place])   ? GERD (gastroesophageal reflux disease)   ? Psoriasis   ? ? ?Past Surgical History:  ?Procedure Laterality Date  ? ABDOMINAL HYSTERECTOMY    ? partial  ? BREAST BIOPSY Left 05/26/2021  ? ? ? ?FAMILY HISTORY:  ?We obtained a detailed, 4-generation family history.  Significant diagnoses are listed below: ?Family History  ?Problem Relation Age of Onset  ? Breast cancer Maternal Aunt 12  ? Prostate cancer Maternal Uncle   ?     mets; dx after 50  ? Prostate cancer Paternal Uncle   ? Leukemia Paternal Uncle   ? Prostate cancer Paternal Uncle 94  ? Gastric cancer Paternal Uncle   ? Skin cancer Maternal Grandmother 63  ? Breast cancer Cousin 79  ?     maternal cousin  ? Cervical cancer Cousin   ? Breast cancer Cousin   ?     dx 62s; maternal cousin   ? ? ? ?Ms. Rask reported that two paternal cousins with a history of breast cancer had negative genetic testing. No other family history of genetic testing was reported. Ms. Hietpas is unaware of previous family history of genetic testing for hereditary cancer risks. There is no known consanguinity. ? ?GENETIC COUNSELING ASSESSMENT: Ms. Swint is a 51 y.o. female with a personal and family history of cancer which is somewhat suggestive of a hereditary cancer syndrome and predisposition to cancer given the presence of related cancers in the family and the ages of diagnosis. We, therefore, discussed and recommended the following at today's visit.  ? ?DISCUSSION: We discussed that 5 - 10% of cancer is hereditary, with most cases of hereditary breast cancer associated with mutations in BRCA1/2.  There are other genes that can be associated with hereditary breast and prostate cancer syndromes.  Type of cancer risk and level of risk are gene-specific. We discussed that testing is beneficial for several reasons including knowing how to follow individuals after completing their treatment, identifying whether potential treatment options would be beneficial, and understanding if other family members could be at risk for cancer and allowing them to undergo genetic testing.  ? ?We reviewed the characteristics, features and inheritance patterns of hereditary cancer syndromes. We also discussed genetic testing, including the appropriate family members to test, the process of testing, insurance  coverage and turn-around-time for results. We discussed the implications of a negative, positive and/or variant of uncertain significant result. In order to get genetic test results in a timely manner so that Ms. Grandison can use these genetic test results for surgical decisions, we recommended Ms. Kolarik pursue genetic testing for the [facility] BRCAPlus Panel.  The BRCAplus panel offered by [employer] and includes sequencing and  deletion/duplication analysis for the following 8 genes: ATM, BRCA1, BRCA2, CDH1, CHEK2, PALB2, PTEN, and TP53. Once complete, we recommend Ms. Corvera pursue reflex genetic testing to a more comprehensive gene panel.  ? ?The CancerNext gene panel offered by [employer] includes sequencing, rearrangement analysis, and RNA analysis for the following 36 genes:   APC, ATM, AXIN2, BARD1, BMPR1A, BRCA1, BRCA2, BRIP1, CDH1, CDK4, CDKN2A, CHEK2, DICER1, HOXB13, EPCAM, GREM1, MLH1, MSH2, MSH3, MSH6, MUTYH, NBN, NF1, NTHL1, PALB2, PMS2, POLD1, POLE, PTEN, RAD51C, RAD51D, RECQL, SMAD4, SMARCA4, STK11, and TP53.  ? ?Based on Ms. Trigo personal and family history of cancer, she meets medical criteria for genetic testing. Despite that she meets criteria, she may still have an out of pocket cost. We discussed that if her out of pocket cost for testing is over $100,  the laboratory should contact them to discuss self-pay prices, patient pay assistance programs, if applicable, and other billing options.  ? ?PLAN: After considering the risks, benefits, and limitations, Ms. Valdivia provided informed consent to pursue genetic testing and the blood sample was sent to [facility] for analysis of the [place] and [place]. Results should be available within approximately 1-2 weeks' time, at which point they will be disclosed by telephone to Ms. Sanjuan, as will any additional recommendations warranted by these results. Ms. Burpee will receive a summary of her genetic counseling visit and a copy of her results once available. This information will also be available in Epic.  ? ?During her appointment, Ms. Carnevale expressed feelings of depression and anxiety, which have increased during the past two weeks.  Ms. Simar met with social work team after her genetics appointment to discuss support resources.  ? ?Ms. Wessman's questions were answered to her satisfaction today. Our contact information was provided should additional questions or  concerns arise. Thank you for the referral and allowing [place] to share in the care of your patient.  ? ?Contrina Orona M. Joette Catching, [place], [place] ?Genetic Counselor ?[email or web address] ?(P) [phone] ? ?The patient was seen for a total of 40 minutes in face-to-face genetic counseling.  The patient was seen alone.  Drs. Lindi Adie and/or Burr Medico were available to discuss this case as needed.  ?_______________________________________________________________________ ?For Office Staff:  ?Number of people involved in session: 1 ?Was an Intern/ student involved with case: no ? ?

## 2021-07-19 ENCOUNTER — Inpatient Hospital Stay: Payer: 59 | Admitting: Licensed Clinical Social Worker

## 2021-07-19 ENCOUNTER — Encounter: Payer: Self-pay | Admitting: Genetic Counselor

## 2021-07-19 DIAGNOSIS — Z8042 Family history of malignant neoplasm of prostate: Secondary | ICD-10-CM

## 2021-07-19 DIAGNOSIS — Z803 Family history of malignant neoplasm of breast: Secondary | ICD-10-CM | POA: Insufficient documentation

## 2021-07-19 HISTORY — DX: Family history of malignant neoplasm of prostate: Z80.42

## 2021-07-19 HISTORY — DX: Family history of malignant neoplasm of breast: Z80.3

## 2021-07-19 NOTE — Progress Notes (Signed)
[facility] CSW Progress Note ? ?Clinical Social Worker met with patient to begin applications for financial assistance. Pt is over income limits for [employer]. Completed and submitted [employer] application today. Began [employer] application which pt will work on Immunologist supporting documents for and bring to [facility].  ? ?Pt reports feeling better today. She has appt next week for intake for ongoing counseling/psych and CSW will see pt on 08/09/21 post-surgery. ? ? ? ?Darlene Briere E Arie Powell, LCSW  ?

## 2021-07-22 ENCOUNTER — Other Ambulatory Visit: Payer: Self-pay

## 2021-07-22 ENCOUNTER — Encounter (HOSPITAL_BASED_OUTPATIENT_CLINIC_OR_DEPARTMENT_OTHER): Payer: Self-pay | Admitting: General Surgery

## 2021-07-22 NOTE — Progress Notes (Signed)
Chart reviewed by Dr. Fransisco Beau. Ok to proceed with surgery as planned at [facility].

## 2021-07-27 ENCOUNTER — Other Ambulatory Visit (HOSPITAL_BASED_OUTPATIENT_CLINIC_OR_DEPARTMENT_OTHER): Payer: Self-pay

## 2021-07-27 ENCOUNTER — Other Ambulatory Visit: Payer: Self-pay

## 2021-07-27 ENCOUNTER — Telehealth: Payer: Self-pay | Admitting: Genetic Counselor

## 2021-07-27 ENCOUNTER — Encounter: Payer: Self-pay | Admitting: Genetic Counselor

## 2021-07-27 DIAGNOSIS — Z1379 Encounter for other screening for genetic and chromosomal anomalies: Secondary | ICD-10-CM | POA: Insufficient documentation

## 2021-07-27 MED ORDER — BUPROPION HCL ER (XL) 150 MG PO TB24
ORAL_TABLET | Freq: Every day | ORAL | 0 refills | Status: DC
  Filled 2021-07-27 (×2): qty 30, 30d supply, fill #0

## 2021-07-27 NOTE — Telephone Encounter (Signed)
Contacted patient in attempt to disclose results of genetic testing.  LVM with contact information requesting a call back.  

## 2021-07-28 ENCOUNTER — Other Ambulatory Visit: Payer: Self-pay

## 2021-07-28 MED ORDER — CLOBETASOL PROPIONATE 0.05 % EX SOLN
CUTANEOUS | 3 refills | Status: AC
  Filled 2021-07-28: qty 50, 25d supply, fill #0
  Filled 2021-09-19: qty 50, 25d supply, fill #1
  Filled 2021-09-20: qty 50, 25d supply, fill #0
  Filled 2022-01-04: qty 50, 25d supply, fill #1
  Filled 2022-02-20: qty 50, 25d supply, fill #2

## 2021-07-28 MED ORDER — DESONIDE 0.05 % EX LOTN
TOPICAL_LOTION | CUTANEOUS | 3 refills | Status: AC
Start: 2021-07-27 — End: ?
  Filled 2021-07-28: qty 60, 30d supply, fill #0
  Filled 2021-09-19: qty 60, 30d supply, fill #1
  Filled 2021-09-20: qty 59, 30d supply, fill #0
  Filled 2021-11-09: qty 59, 30d supply, fill #1
  Filled 2022-01-04: qty 59, 30d supply, fill #2

## 2021-07-28 MED ORDER — TRIAMCINOLONE ACETONIDE 0.1 % EX CREA
TOPICAL_CREAM | CUTANEOUS | 3 refills | Status: AC
  Filled 2021-07-28: qty 453.6, 30d supply, fill #0
  Filled 2021-09-19: qty 454, 30d supply, fill #1
  Filled 2021-09-20: qty 454, 30d supply, fill #0
  Filled 2021-11-10: qty 454, 30d supply, fill #1
  Filled 2021-12-06 – 2022-02-20 (×3): qty 454, 30d supply, fill #2

## 2021-07-29 ENCOUNTER — Other Ambulatory Visit: Payer: Self-pay

## 2021-07-29 NOTE — Telephone Encounter (Signed)
Contacted patient in attempt to disclose results of genetic testing.  LVM with contact information requesting a call back.  

## 2021-08-02 ENCOUNTER — Ambulatory Visit
Admission: RE | Admit: 2021-08-02 | Discharge: 2021-08-02 | Disposition: A | Discharge status: Home / Self Care | Payer: 59 | Source: Ambulatory Visit | Attending: General Surgery | Admitting: General Surgery

## 2021-08-02 ENCOUNTER — Other Ambulatory Visit: Payer: Self-pay

## 2021-08-02 DIAGNOSIS — C50412 Malignant neoplasm of upper-outer quadrant of left female breast: Secondary | ICD-10-CM

## 2021-08-02 NOTE — Progress Notes (Signed)

## 2021-08-03 ENCOUNTER — Ambulatory Visit
Admission: RE | Admit: 2021-08-03 | Discharge: 2021-08-03 | Disposition: A | Discharge status: Home / Self Care | Payer: 59 | Source: Ambulatory Visit | Attending: General Surgery | Admitting: General Surgery

## 2021-08-03 ENCOUNTER — Encounter (HOSPITAL_BASED_OUTPATIENT_CLINIC_OR_DEPARTMENT_OTHER)
Admission: RE | Disposition: A | Discharge status: Home / Self Care | Payer: Self-pay | Source: Ambulatory Visit | Attending: General Surgery

## 2021-08-03 ENCOUNTER — Other Ambulatory Visit (HOSPITAL_COMMUNITY): Payer: Self-pay

## 2021-08-03 ENCOUNTER — Ambulatory Visit (HOSPITAL_BASED_OUTPATIENT_CLINIC_OR_DEPARTMENT_OTHER)
Admission: RE | Admit: 2021-08-03 | Discharge: 2021-08-03 | Disposition: A | Discharge status: Home / Self Care | Payer: 59 | Source: Ambulatory Visit | Attending: General Surgery | Admitting: General Surgery

## 2021-08-03 ENCOUNTER — Ambulatory Visit (HOSPITAL_BASED_OUTPATIENT_CLINIC_OR_DEPARTMENT_OTHER): Payer: 59 | Admitting: Anesthesiology

## 2021-08-03 ENCOUNTER — Encounter (HOSPITAL_BASED_OUTPATIENT_CLINIC_OR_DEPARTMENT_OTHER): Payer: Self-pay | Admitting: General Surgery

## 2021-08-03 ENCOUNTER — Other Ambulatory Visit: Payer: Self-pay

## 2021-08-03 DIAGNOSIS — Z170 Estrogen receptor positive status [ER+]: Secondary | ICD-10-CM

## 2021-08-03 DIAGNOSIS — C50412 Malignant neoplasm of upper-outer quadrant of left female breast: Secondary | ICD-10-CM | POA: Insufficient documentation

## 2021-08-03 DIAGNOSIS — J45909 Unspecified asthma, uncomplicated: Secondary | ICD-10-CM | POA: Diagnosis not present

## 2021-08-03 DIAGNOSIS — F418 Other specified anxiety disorders: Secondary | ICD-10-CM | POA: Diagnosis not present

## 2021-08-03 DIAGNOSIS — C50912 Malignant neoplasm of unspecified site of left female breast: Secondary | ICD-10-CM

## 2021-08-03 DIAGNOSIS — Z803 Family history of malignant neoplasm of breast: Secondary | ICD-10-CM | POA: Diagnosis not present

## 2021-08-03 DIAGNOSIS — K219 Gastro-esophageal reflux disease without esophagitis: Secondary | ICD-10-CM | POA: Insufficient documentation

## 2021-08-03 HISTORY — PX: BREAST LUMPECTOMY WITH RADIOACTIVE SEED AND SENTINEL LYMPH NODE BIOPSY: SHX6550

## 2021-08-03 HISTORY — DX: Unspecified asthma, uncomplicated: J45.909

## 2021-08-03 HISTORY — PX: RADIOACTIVE SEED GUIDED AXILLARY SENTINEL LYMPH NODE: SHX6735

## 2021-08-03 LAB — NO BLOOD PRODUCTS

## 2021-08-03 SURGERY — BREAST LUMPECTOMY WITH RADIOACTIVE SEED AND SENTINEL LYMPH NODE BIOPSY
Anesthesia: General | Site: Breast | Laterality: Left

## 2021-08-03 MED ORDER — CELECOXIB 200 MG PO CAPS
200.0000 mg | ORAL_CAPSULE | ORAL | Status: AC
  Administered 2021-08-03: 200 mg via ORAL

## 2021-08-03 MED ORDER — AMISULPRIDE (ANTIEMETIC) 5 MG/2ML IV SOLN
10.0000 mg | Freq: Once | INTRAVENOUS | Status: AC | PRN
  Administered 2021-08-03: 10 mg via INTRAVENOUS

## 2021-08-03 MED ORDER — MIDAZOLAM HCL 2 MG/2ML IJ SOLN
INTRAMUSCULAR | Status: AC
  Filled 2021-08-03: qty 2

## 2021-08-03 MED ORDER — OXYCODONE HCL 5 MG PO TABS
5.0000 mg | ORAL_TABLET | Freq: Once | ORAL | Status: AC | PRN
  Administered 2021-08-03: 5 mg via ORAL

## 2021-08-03 MED ORDER — LIDOCAINE 2% (20 MG/ML) 5 ML SYRINGE
INTRAMUSCULAR | Status: DC | PRN
  Administered 2021-08-03: 60 mg via INTRAVENOUS

## 2021-08-03 MED ORDER — CELECOXIB 200 MG PO CAPS
ORAL_CAPSULE | ORAL | Status: AC
  Filled 2021-08-03: qty 1

## 2021-08-03 MED ORDER — SCOPOLAMINE 1 MG/3DAYS TD PT72
MEDICATED_PATCH | TRANSDERMAL | Status: AC
Start: 2021-08-03 — End: ?
  Filled 2021-08-03: qty 1

## 2021-08-03 MED ORDER — FENTANYL CITRATE (PF) 100 MCG/2ML IJ SOLN
50.0000 ug | Freq: Once | INTRAMUSCULAR | Status: AC
  Administered 2021-08-03: 50 ug via INTRAVENOUS

## 2021-08-03 MED ORDER — CEFAZOLIN SODIUM-DEXTROSE 2-4 GM/100ML-% IV SOLN
INTRAVENOUS | Status: AC
Start: 2021-08-03 — End: ?
  Filled 2021-08-03: qty 100

## 2021-08-03 MED ORDER — DEXAMETHASONE SODIUM PHOSPHATE 4 MG/ML IJ SOLN
INTRAMUSCULAR | Status: DC | PRN
  Administered 2021-08-03: 10 mg via INTRAVENOUS

## 2021-08-03 MED ORDER — PHENYLEPHRINE 80 MCG/ML (10ML) SYRINGE FOR IV PUSH (FOR BLOOD PRESSURE SUPPORT)
PREFILLED_SYRINGE | INTRAVENOUS | Status: AC
  Filled 2021-08-03: qty 10

## 2021-08-03 MED ORDER — METHYLENE BLUE 1 % INJ SOLN
INTRAVENOUS | Status: AC
  Filled 2021-08-03: qty 10

## 2021-08-03 MED ORDER — DEXAMETHASONE SODIUM PHOSPHATE 10 MG/ML IJ SOLN
INTRAMUSCULAR | Status: AC
  Filled 2021-08-03: qty 1

## 2021-08-03 MED ORDER — SCOPOLAMINE 1 MG/3DAYS TD PT72
1.0000 | MEDICATED_PATCH | TRANSDERMAL | Status: DC
  Administered 2021-08-03: 1.5 mg via TRANSDERMAL

## 2021-08-03 MED ORDER — ACETAMINOPHEN 500 MG PO TABS
1000.0000 mg | ORAL_TABLET | ORAL | Status: AC
  Administered 2021-08-03: 1000 mg via ORAL

## 2021-08-03 MED ORDER — ONDANSETRON HCL 4 MG/2ML IJ SOLN
INTRAMUSCULAR | Status: DC | PRN
  Administered 2021-08-03: 4 mg via INTRAVENOUS

## 2021-08-03 MED ORDER — LACTATED RINGERS IV SOLN: INTRAVENOUS | Status: DC

## 2021-08-03 MED ORDER — PROPOFOL 10 MG/ML IV BOLUS
INTRAVENOUS | Status: DC | PRN
  Administered 2021-08-03: 150 mg via INTRAVENOUS
  Administered 2021-08-03: 20 mg via INTRAVENOUS

## 2021-08-03 MED ORDER — OXYCODONE HCL 5 MG PO TABS
ORAL_TABLET | ORAL | Status: AC
  Filled 2021-08-03: qty 1

## 2021-08-03 MED ORDER — BUPIVACAINE-EPINEPHRINE (PF) 0.5% -1:200000 IJ SOLN
INTRAMUSCULAR | Status: DC | PRN
  Administered 2021-08-03: 15 mL via PERINEURAL

## 2021-08-03 MED ORDER — OXYCODONE HCL 5 MG PO TABS
5.0000 mg | ORAL_TABLET | Freq: Four times a day (QID) | ORAL | 0 refills | Status: DC | PRN
  Filled 2021-08-03: qty 15, 4d supply, fill #0

## 2021-08-03 MED ORDER — MIDAZOLAM HCL 2 MG/2ML IJ SOLN
2.0000 mg | Freq: Once | INTRAMUSCULAR | Status: AC
Start: 2021-08-03 — End: 2021-08-03
  Administered 2021-08-03: 2 mg via INTRAVENOUS

## 2021-08-03 MED ORDER — MAGTRACE LYMPHATIC TRACER
INTRAMUSCULAR | Status: DC | PRN
  Administered 2021-08-03: 2 mL via INTRAMUSCULAR

## 2021-08-03 MED ORDER — PHENYLEPHRINE HCL (PRESSORS) 10 MG/ML IV SOLN
INTRAVENOUS | Status: DC | PRN
  Administered 2021-08-03 (×5): 80 ug via INTRAVENOUS

## 2021-08-03 MED ORDER — FENTANYL CITRATE (PF) 100 MCG/2ML IJ SOLN
INTRAMUSCULAR | Status: AC
  Filled 2021-08-03: qty 2

## 2021-08-03 MED ORDER — PROPOFOL 10 MG/ML IV BOLUS
INTRAVENOUS | Status: AC
Start: 2021-08-03 — End: ?
  Filled 2021-08-03: qty 20

## 2021-08-03 MED ORDER — AMISULPRIDE (ANTIEMETIC) 5 MG/2ML IV SOLN
INTRAVENOUS | Status: AC
  Filled 2021-08-03: qty 4

## 2021-08-03 MED ORDER — ACETAMINOPHEN 500 MG PO TABS
ORAL_TABLET | ORAL | Status: AC
  Filled 2021-08-03: qty 2

## 2021-08-03 MED ORDER — OXYCODONE HCL 5 MG/5ML PO SOLN: 5.0000 mg | Freq: Once | ORAL | Status: AC | PRN

## 2021-08-03 MED ORDER — GABAPENTIN 300 MG PO CAPS
ORAL_CAPSULE | ORAL | Status: AC
  Filled 2021-08-03: qty 1

## 2021-08-03 MED ORDER — ONDANSETRON HCL 4 MG/2ML IJ SOLN: 4.0000 mg | Freq: Once | INTRAMUSCULAR | Status: DC | PRN

## 2021-08-03 MED ORDER — CHLORHEXIDINE GLUCONATE CLOTH 2 % EX PADS
6.0000 | MEDICATED_PAD | Freq: Once | CUTANEOUS | Status: DC
Start: 2021-08-03 — End: 2021-08-03

## 2021-08-03 MED ORDER — FENTANYL CITRATE (PF) 100 MCG/2ML IJ SOLN
25.0000 ug | INTRAMUSCULAR | Status: DC | PRN
  Administered 2021-08-03 (×3): 25 ug via INTRAVENOUS

## 2021-08-03 MED ORDER — BUPIVACAINE-EPINEPHRINE (PF) 0.25% -1:200000 IJ SOLN
INTRAMUSCULAR | Status: AC
  Filled 2021-08-03: qty 30

## 2021-08-03 MED ORDER — BUPIVACAINE LIPOSOME 1.3 % IJ SUSP
INTRAMUSCULAR | Status: DC | PRN
  Administered 2021-08-03: 10 mL

## 2021-08-03 MED ORDER — GABAPENTIN 300 MG PO CAPS
300.0000 mg | ORAL_CAPSULE | ORAL | Status: AC
  Administered 2021-08-03: 300 mg via ORAL

## 2021-08-03 MED ORDER — CHLORHEXIDINE GLUCONATE CLOTH 2 % EX PADS: 6.0000 | MEDICATED_PAD | Freq: Once | CUTANEOUS | Status: DC

## 2021-08-03 MED ORDER — CEFAZOLIN SODIUM-DEXTROSE 2-4 GM/100ML-% IV SOLN
2.0000 g | INTRAVENOUS | Status: AC
  Administered 2021-08-03: 2 g via INTRAVENOUS

## 2021-08-03 MED ORDER — ONDANSETRON HCL 4 MG/2ML IJ SOLN
INTRAMUSCULAR | Status: AC
  Filled 2021-08-03: qty 2

## 2021-08-03 MED ORDER — BUPIVACAINE HCL (PF) 0.5 % IJ SOLN
INTRAMUSCULAR | Status: DC | PRN
  Administered 2021-08-03: 20 mL

## 2021-08-03 MED ORDER — GABAPENTIN 100 MG PO CAPS
100.0000 mg | ORAL_CAPSULE | Freq: Three times a day (TID) | ORAL | 2 refills | Status: DC
  Filled 2021-08-03: qty 90, 30d supply, fill #0
  Filled 2021-09-19: qty 90, 30d supply, fill #1
  Filled 2021-09-20: qty 90, 30d supply, fill #0

## 2021-08-03 MED ORDER — LIDOCAINE 2% (20 MG/ML) 5 ML SYRINGE
INTRAMUSCULAR | Status: AC
Start: 2021-08-03 — End: ?
  Filled 2021-08-03: qty 5

## 2021-08-03 SURGICAL SUPPLY — 45 items
ADH SKN CLS APL DERMABOND .7 (GAUZE/BANDAGES/DRESSINGS) ×4
APL PRP STRL LF DISP 70% ISPRP (MISCELLANEOUS) ×2
APPLIER CLIP 9.375 MED OPEN (MISCELLANEOUS) ×3
APR CLP MED 9.3 20 MLT OPN (MISCELLANEOUS) ×2
BLADE SURG 15 STRL LF DISP TIS (BLADE) ×3 IMPLANT
BLADE SURG 15 STRL SS (BLADE) ×3
CANISTER SUC SOCK COL 7IN (MISCELLANEOUS) IMPLANT
CANISTER SUCT 1200ML W/VALVE (MISCELLANEOUS) IMPLANT
CHLORAPREP W/TINT 26 (MISCELLANEOUS) ×4 IMPLANT
CLIP APPLIE 9.375 MED OPEN (MISCELLANEOUS) ×3 IMPLANT
COVER BACK TABLE 60X90IN (DRAPES) ×4 IMPLANT
COVER MAYO STAND STRL (DRAPES) ×4 IMPLANT
COVER PROBE W GEL 5X96 (DRAPES) ×4 IMPLANT
DERMABOND ADVANCED (GAUZE/BANDAGES/DRESSINGS) ×2
DERMABOND ADVANCED .7 DNX12 (GAUZE/BANDAGES/DRESSINGS) ×3 IMPLANT
DRAPE LAPAROSCOPIC ABDOMINAL (DRAPES) ×4 IMPLANT
DRAPE UTILITY XL STRL (DRAPES) ×4 IMPLANT
ELECT COATED BLADE 2.86 ST (ELECTRODE) ×4 IMPLANT
ELECT REM PT RETURN 9FT ADLT (ELECTROSURGICAL) ×3
ELECTRODE REM PT RTRN 9FT ADLT (ELECTROSURGICAL) ×3 IMPLANT
GLOVE BIO SURGEON STRL SZ7.5 (GLOVE) ×4 IMPLANT
GOWN STRL REUS W/ TWL LRG LVL3 (GOWN DISPOSABLE) ×6 IMPLANT
GOWN STRL REUS W/TWL LRG LVL3 (GOWN DISPOSABLE) ×6
ILLUMINATOR WAVEGUIDE N/F (MISCELLANEOUS) IMPLANT
KIT MARKER MARGIN INK (KITS) ×4 IMPLANT
LIGHT WAVEGUIDE WIDE FLAT (MISCELLANEOUS) IMPLANT
NDL HYPO 25X1 1.5 SAFETY (NEEDLE) ×3 IMPLANT
NDL SAFETY ECLIPSE 18X1.5 (NEEDLE) IMPLANT
NEEDLE HYPO 18GX1.5 SHARP (NEEDLE)
NEEDLE HYPO 25X1 1.5 SAFETY (NEEDLE) ×3 IMPLANT
NS IRRIG 1000ML POUR BTL (IV SOLUTION) IMPLANT
PACK BASIN DAY SURGERY FS (CUSTOM PROCEDURE TRAY) ×4 IMPLANT
PENCIL SMOKE EVACUATOR (MISCELLANEOUS) ×4 IMPLANT
SLEEVE SCD COMPRESS KNEE MED (STOCKING) ×4 IMPLANT
SPIKE FLUID TRANSFER (MISCELLANEOUS) IMPLANT
SPONGE T-LAP 18X18 ~~LOC~~+RFID (SPONGE) ×4 IMPLANT
SUT MON AB 4-0 PC3 18 (SUTURE) ×8 IMPLANT
SUT SILK 2 0 SH (SUTURE) IMPLANT
SUT VICRYL 3-0 CR8 SH (SUTURE) ×4 IMPLANT
SYR CONTROL 10ML LL (SYRINGE) ×4 IMPLANT
TOWEL GREEN STERILE FF (TOWEL DISPOSABLE) ×4 IMPLANT
TRACER MAGTRACE VIAL (MISCELLANEOUS) ×1 IMPLANT
TRAY FAXITRON CT DISP (TRAY / TRAY PROCEDURE) ×4 IMPLANT
TUBE CONNECTING 20X1/4 (TUBING) IMPLANT
YANKAUER SUCT BULB TIP NO VENT (SUCTIONS) IMPLANT

## 2021-08-03 NOTE — Anesthesia Procedure Notes (Signed)
Procedure Name: LMA Insertion Date/Time: 08/03/2021 12:30 PM Performed by: Ezequiel Kayser, CRNA Pre-anesthesia Checklist: Patient identified, Emergency Drugs available, Suction available and Patient being monitored Patient Re-evaluated:Patient Re-evaluated prior to induction Oxygen Delivery Method: Circle System Utilized Preoxygenation: Pre-oxygenation with 100% oxygen Induction Type: IV induction Ventilation: Mask ventilation without difficulty LMA: LMA inserted LMA Size: 4.0 Number of attempts: 1 Airway Equipment and Method: Bite block Placement Confirmation: positive ETCO2 Tube secured with: Tape Dental Injury: Teeth and Oropharynx as per pre-operative assessment

## 2021-08-03 NOTE — Progress Notes (Signed)
Assisted Dr. Bass with left, pectoralis, ultrasound guided block. Side rails up, monitors on throughout procedure. See vital signs in flow sheet. Tolerated Procedure well. 

## 2021-08-03 NOTE — Anesthesia Preprocedure Evaluation (Addendum)
Anesthesia Evaluation  Patient identified by MRN, date of birth, ID band Patient awake    Reviewed: Allergy & Precautions, NPO status , Patient's Chart, lab work & pertinent test results  Airway Mallampati: II  TM Distance: >3 FB Neck ROM: Full    Dental no notable dental hx.    Pulmonary asthma ,    Pulmonary exam normal breath sounds clear to auscultation       Cardiovascular negative cardio ROS Normal cardiovascular exam Rhythm:Regular Rate:Normal     Neuro/Psych PSYCHIATRIC DISORDERS Anxiety Depression negative neurological ROS     GI/Hepatic Neg liver ROS, GERD  ,  Endo/Other  negative endocrine ROS  Renal/GU negative Renal ROS  negative genitourinary   Musculoskeletal negative musculoskeletal ROS (+)   Abdominal   Peds negative pediatric ROS (+)  Hematology negative hematology ROS (+)   Anesthesia Other Findings Breast cancer  Reproductive/Obstetrics negative OB ROS                             Anesthesia Physical Anesthesia Plan  ASA: 3  Anesthesia Plan: General   Post-op Pain Management: Regional block* and Tylenol PO (pre-op)*   Induction: Intravenous  PONV Risk Score and Plan: 3 and Treatment may vary due to age or medical condition, Midazolam, Scopolamine patch - Pre-op, Ondansetron and Dexamethasone  Airway Management Planned: LMA  Additional Equipment: None  Intra-op Plan:   Post-operative Plan: Extubation in OR  Informed Consent: I have reviewed the patients History and Physical, chart, labs and discussed the procedure including the risks, benefits and alternatives for the proposed anesthesia with the patient or authorized representative who has indicated his/her understanding and acceptance.     Dental advisory given  Plan Discussed with: CRNA, Anesthesiologist and Surgeon  Anesthesia Plan Comments:         Anesthesia Quick Evaluation

## 2021-08-03 NOTE — Anesthesia Procedure Notes (Signed)
Anesthesia Regional Block: Pectoralis block   Pre-Anesthetic Checklist: , timeout performed,  Correct Patient, Correct Site, Correct Laterality,  Correct Procedure, Correct Position, site marked,  Risks and benefits discussed,  Surgical consent,  Pre-op evaluation,  At surgeon's request and post-op pain management  Laterality: Left  Prep: chloraprep       Needles:  Injection technique: Single-shot  Needle Type: Echogenic Stimulator Needle     Needle Length: 10cm  Needle Gauge: 20     Additional Needles:   Procedures:,,,, ultrasound used (permanent image in chart),,    Narrative:  Start time: 08/03/2021 12:10 PM End time: 08/03/2021 12:15 PM Injection made incrementally with aspirations every 5 mL.  Performed by: Personally  Anesthesiologist: Merlinda Frederick, MD  Additional Notes: Functioning IV was confirmed and monitors were applied.  Sterile prep and drape,hand hygiene and sterile gloves were used. Ultrasound guidance: relevant anatomy identified, needle position confirmed, local anesthetic spread visualized around nerve(s)., vascular puncture avoided. Negative aspiration and negative test dose prior to incremental administration of local anesthetic. The patient tolerated the procedure well.

## 2021-08-03 NOTE — Anesthesia Postprocedure Evaluation (Signed)
Anesthesia Post Note  Patient: Darlene Peters  Procedure(s) Performed: LEFT BREAST LUMPECTOMY WITH RADIOACTIVE SEED AND SENTINEL LYMPH NODE BIOPSY (Left: Breast) RADIOACTIVE SEED GUIDED LEFT AXILLARY SENTINEL LYMPH NODE DISSECTION (Left: Axilla)     Patient location during evaluation: PACU Anesthesia Type: General Level of consciousness: awake Pain management: pain level controlled Vital Signs Assessment: post-procedure vital signs reviewed and stable Respiratory status: spontaneous breathing and respiratory function stable Cardiovascular status: stable Postop Assessment: no apparent nausea or vomiting Anesthetic complications: no   No notable events documented.  Last Vitals:  Vitals:   08/03/21 1500 08/03/21 1547  BP: (!) 126/95 (!) 152/97  Pulse: 66 69  Resp: 14 16  Temp:  36.7 C  SpO2: 96% 100%    Last Pain:  Vitals:   08/03/21 1547  TempSrc: Oral  PainSc: 3                  Candra R Carrel Leather

## 2021-08-03 NOTE — Interval H&P Note (Signed)
History and Physical Interval Note:  08/03/2021 11:44 AM  Darlene Peters  has presented today for surgery, with the diagnosis of LEFT BREAST CANCER.  The various methods of treatment have been discussed with the patient and family. After consideration of risks, benefits and other options for treatment, the patient has consented to  Procedure(s): LEFT BREAST LUMPECTOMY WITH RADIOACTIVE SEED AND SENTINEL LYMPH NODE BIOPSY (Left) RADIOACTIVE SEED GUIDED LEFT AXILLARY SENTINEL LYMPH NODE DISSECTION (Left) as a surgical intervention.  The patient's history has been reviewed, patient examined, no change in status, stable for surgery.  I have reviewed the patient's chart and labs.  Questions were answered to the patient's satisfaction.     Autumn Messing III

## 2021-08-03 NOTE — Transfer of Care (Signed)
Immediate Anesthesia Transfer of Care Note  Patient: Darlene Peters  Procedure(s) Performed: LEFT BREAST LUMPECTOMY WITH RADIOACTIVE SEED AND SENTINEL LYMPH NODE BIOPSY (Left: Breast) RADIOACTIVE SEED GUIDED LEFT AXILLARY SENTINEL LYMPH NODE DISSECTION (Left: Axilla)  Patient Location: PACU  Anesthesia Type:General and Regional  Level of Consciousness: drowsy  Airway & Oxygen Therapy: Patient Spontanous Breathing and Patient connected to face mask oxygen  Post-op Assessment: Report given to RN and Post -op Vital signs reviewed and stable  Post vital signs: Reviewed and stable  Last Vitals:  Vitals Value Taken Time  BP 130/82 08/03/21 1415  Temp    Pulse 83 08/03/21 1417  Resp 17 08/03/21 1417  SpO2 100 % 08/03/21 1417  Vitals shown include unvalidated device data.  Last Pain:  Vitals:   08/03/21 1158  TempSrc: Oral  PainSc: 0-No pain         Complications: No notable events documented.

## 2021-08-03 NOTE — H&P (Signed)
REFERRING PHYSICIAN: Pearson Forster, MD  PROVIDER: Landry Corporal, MD  MRN: [id number] DOB: Nov 07, 1970 Subjective   Chief Complaint: Breast Cancer   History of Present Illness: Darlene Peters is a 51 y.o. female who is seen today as an office consultation for evaluation of Breast Cancer .   We are asked to see the patient in consultation by Dr. Suzanna Obey to evaluate her for a new left breast cancer. The patient is a 51 year old black female who recently went for a routine screening mammogram. At that time Darlene Peters was found to have a 2.6 cm mass in the upper outer aspect of the left breast. There was 1 abnormal looking lymph node but it was very close to the vessels and could not be sampled adequately. 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 Darlene Peters does have a history of breast cancer in a couple cousins. Darlene Peters is otherwise in good health and does not smoke.  Review of Systems: A complete review of systems was obtained from the patient. I have reviewed this information and discussed as appropriate with the patient. See HPI as well for other ROS.  ROS   Medical History: Past Medical History:  Diagnosis Date   Allergy   Asthma, unspecified asthma severity, unspecified whether complicated, unspecified whether persistent   GERD (gastroesophageal reflux disease)   Patient Active Problem List  Diagnosis   Gastroesophageal reflux disease without esophagitis   Malignant neoplasm of upper-outer quadrant of left breast in female, estrogen receptor positive (CMS-HCC)   Past Surgical History:  Procedure Laterality Date   HYSTERECTOMY 2009    Allergies  Allergen Reactions   Shellfish Containing Products Rash  Wheezing   Gluten Hives   Latex Rash   Current Outpatient Medications on File Prior to Visit  Medication Sig Dispense Refill   albuterol 90 mcg/actuation  inhaler Inhale into the lungs every 6 (six) hours as needed   Lactobacillus acidophilus (PROBIOTIC ORAL) Take by mouth   montelukast (SINGULAIR) 10 mg tablet Take by mouth   multivitamin (MULTIVITAMIN) tablet Take by mouth   omeprazole (PRILOSEC) 20 MG DR capsule Take 20 mg by mouth once daily   No current facility-administered medications on file prior to visit.   Family History  Problem Relation Age of Onset   Obesity Mother   High blood pressure (Hypertension) Mother   Hyperlipidemia (Elevated cholesterol) Mother   High blood pressure (Hypertension) Father   Coronary Artery Disease (Blocked arteries around heart) Father   Hyperlipidemia (Elevated cholesterol) Sister   High blood pressure (Hypertension) Sister    Social History   Tobacco Use  Smoking Status Never  Smokeless Tobacco Never    Social History   Socioeconomic History   Marital status: Married  Tobacco Use   Smoking status: Never   Smokeless tobacco: Never  Substance and Sexual Activity   Alcohol use: Never   Drug use: Never   Objective:   Vitals:  BP: 124/88  Pulse: 76  Temp: 36.8 C (98.2 F)  SpO2: 98%  Weight: 93.1 kg (205 lb 3.2 oz)  Height: 177.8 cm (_0 )   Body mass index is 29.44 kg/m.  Physical Exam Vitals reviewed.  Constitutional:  General: Darlene Peters is not in acute distress. Appearance: Normal appearance.  HENT:  Head: Normocephalic and atraumatic.  Right Ear: External ear normal.  Left Ear: External ear normal.  Nose: Nose normal.  Mouth/Throat:  Mouth: Mucous membranes are moist.  Pharynx: Oropharynx is clear.  Eyes:  General: No scleral icterus. Extraocular Movements: Extraocular movements intact.  Conjunctiva/sclera: Conjunctivae normal.  Pupils: Pupils are equal, round, and reactive to light.  Cardiovascular:  Rate and Rhythm: Normal rate and regular rhythm.  Pulses: Normal pulses.  Heart sounds: Normal heart sounds.  Pulmonary:  Effort: Pulmonary effort is normal. No  respiratory distress.  Breath sounds: Normal breath sounds.  Abdominal:  General: Bowel sounds are normal.  Palpations: Abdomen is soft.  Tenderness: There is no abdominal tenderness.  Musculoskeletal:  General: No swelling, tenderness or deformity. Normal range of motion.  Cervical back: Normal range of motion and neck supple.  Skin: General: Skin is warm and dry.  Coloration: Skin is not jaundiced.  Neurological:  General: No focal deficit present.  Mental Status: Darlene Peters is alert and oriented to person, place, and time.  Psychiatric:  Mood and Affect: Mood normal.  Behavior: Behavior normal.     Breast: There is a palpable fullness in the outer aspect of the central left breast. There is no palpable mass in the right breast. There is no palpable axillary, supraclavicular, or cervical lymphadenopathy. The patient does appear to have symmetrically dense breast tissue  Labs, Imaging and Diagnostic Testing:  Assessment and Plan:   Diagnoses and all orders for this visit:  Malignant neoplasm of upper-outer quadrant of left breast in female, estrogen receptor positive (CMS-HCC) - Ambulatory Referral to Oncology-Medical - Ambulatory Referral to Radiation Oncology - Ambulatory Referral to Physical Therapy - Ambulatory Referral to Genetics - MRI breast bilateral with and without contrast; Future - [facility] Case Posting Request; Future    The patient appears to have a 2.6 cm cancer in the outer aspect of the central left breast with 1 questionable lymph node. I have discussed with her in detail the different options for treatment and at this point Darlene Peters favors lumpectomy. Darlene Peters will be a candidate for sentinel node biopsy but will likely need a targeted node dissection of the 1 abnormal lymph node since it could not be sampled. We will plan to evaluate her with MRI given the density of the breast. I will refer her to medical and radiation oncology to discuss adjuvant therapy as well as physical  therapy for preoperative lymphedema testing. Darlene Peters may need a genetics evaluation as well. We will call her with the results of her MRI and then finalize a plan for surgery.

## 2021-08-03 NOTE — Discharge Instructions (Addendum)
May have Tylenol again after 4 pm and ibuprofen after 6pm  Post Anesthesia Home Care Instructions  Activity: Get plenty of rest for the remainder of the day. A responsible individual must stay with you for 24 hours following the procedure.  For the next 24 hours, DO NOT: -Drive a car -Paediatric nurse -Drink alcoholic beverages -Take any medication unless instructed by your physician -Make any legal decisions or sign important papers.  Meals: Start with liquid foods such as gelatin or soup. Progress to regular foods as tolerated. Avoid greasy, spicy, heavy foods. If nausea and/or vomiting occur, drink only clear liquids until the nausea and/or vomiting subsides. Call your physician if vomiting continues.  Special Instructions/Symptoms: Your throat may feel dry or sore from the anesthesia or the breathing tube placed in your throat during surgery. If this causes discomfort, gargle with warm salt water. The discomfort should disappear within 24 hours.  If you had a scopolamine patch placed behind your ear for the management of post- operative nausea and/or vomiting:  1. The medication in the patch is effective for 72 hours, after which it should be removed.  Wrap patch in a tissue and discard in the trash. Wash hands thoroughly with soap and water. 2. You may remove the patch earlier than 72 hours if you experience unpleasant side effects which may include dry mouth, dizziness or visual disturbances. 3. Avoid touching the patch. Wash your hands with soap and water after contact with the patch.    Information for Discharge Teaching:    EXPAREL (bupivacaine liposome injectable suspension)   Your surgeon or anesthesiologist gave you EXPAREL(bupivacaine) to help control your pain after surgery.  EXPAREL is a local anesthetic that provides pain relief by numbing the tissue around the surgical site. EXPAREL is designed to release pain medication over time and can control pain for up to 72  hours. Depending on how you respond to EXPAREL, you may require less pain medication during your recovery.  Possible side effects: Temporary loss of sensation or ability to move in the area where bupivacaine was injected. Nausea, vomiting, constipation Rarely, numbness and tingling in your mouth or lips, lightheadedness, or anxiety may occur. Call your doctor right away if you think you may be experiencing any of these sensations, or if you have other questions regarding possible side effects.  Follow all other discharge instructions given to you by your surgeon or nurse. Eat a healthy diet and drink plenty of water or other fluids.  If you return to the hospital for any reason within 96 hours following the administration of EXPAREL, it is important for health care providers to know that you have received this anesthetic. A teal colored band has been placed on your arm with the date, time and amount of EXPAREL you have received in order to alert and inform your health care providers. Please leave this armband in place for the full 96 hours following administration, and then you may remove the band.

## 2021-08-03 NOTE — Op Note (Signed)
08/03/2021  2:05 PM  PATIENT:  Darlene Peters  51 y.o. female  PRE-OPERATIVE DIAGNOSIS:  LEFT BREAST CANCER  POST-OPERATIVE DIAGNOSIS:  LEFT BREAST CANCER  PROCEDURE:  Procedure(s): LEFT BREAST LUMPECTOMY WITH RADIOACTIVE SEED LOCALIZATION AND DEEP LEFT AXILLARY SENTINEL LYMPH NODE BIOPSY  WITH RADIOACTIVE SEED GUIDED LEFT AXILLARY TARGETED LYMPH NODE DISSECTION (Left)  SURGEON:  Surgeon(s) and Role:    * Jovita Kussmaul, MD - Primary  PHYSICIAN ASSISTANT:   ASSISTANTS: none   ANESTHESIA:   local and general  EBL:  1 mL   BLOOD ADMINISTERED:none  DRAINS: none   LOCAL MEDICATIONS USED:  MARCAINE     SPECIMEN:  Source of Specimen:  left breast tissue with sentinel nodes x 3 and targeted node  DISPOSITION OF SPECIMEN:  PATHOLOGY  COUNTS:  YES  TOURNIQUET:  * No tourniquets in log *  DICTATION: .Dragon Dictation  After informed consent was obtained the patient was brought to the operating room and placed in the supine position on the operating table.  After adequate induction of general anesthesia the patient's left chest, breast, and axillary area were prepped with ChloraPrep, allowed to dry, and draped in usual sterile manner.  An appropriate timeout was performed.  At this point, 2 cc of iron oxide were injected in the subareolar plexus of the left breast and the breast was massaged for 5 minutes.  Previously an I-125 seed was placed in the outer central left breast to mark an area of invasive breast cancer and a second seed was placed in the left axilla to mark a abnormal looking lymph node that could not be percutaneously biopsied.  Attention was first turned to the left breast.  The neoprobe was set to I-125 in the area of radioactivity was readily identified.  The area around this was infiltrated with quarter percent Marcaine.  A curvilinear incision was made along the outer aspect of the areola of the left breast.  The incision was carried through the skin and subcutaneous  tissue sharply with the electrocautery.  Dissection was then carried towards the radioactive seed under the direction of the neoprobe.  Once I more closely approached the radioactive seed I removed a fairly large size area of breast tissue around the radioactive seed while checking the area of radioactivity frequently.  I did encounter a lot of what appeared to be fibrocystic tissue during this dissection.  Once the specimen was removed it was oriented with the appropriate paint colors.  A specimen radiograph was obtained that showed the clip and seed to be near the center of the specimen.  The specimen was then sent to pathology for further evaluation.  Hemostasis was achieved using the Bovie electrocautery.  The wound was irrigated with saline and infiltrated with some more quarter percent Marcaine.  The cavity was marked with clips.  The deep layer of the wound was closed with layers of interrupted 3-0 Vicryl stitches.  The skin was then closed with interrupted 4-0 Monocryl subcuticular stitches.  Attention was then turned to the left axilla.  The mag trace was used to identify a signal in the left axilla.  The area overlying this was infiltrated with quarter percent Marcaine.  A transversely oriented incision was made with a 15 blade knife overlying the signal.  The incision was carried through the skin and subcutaneous tissue sharply with the electrocautery until the deep left axillary space was entered.  I was able to identify 3 lymph node areas with signal.  Each  of these areas was excised sharply with the electrocautery and the surrounding small vessels and lymphatics were controlled with clips.  These were sent as sentinel nodes numbers 1 through 3.  The neoprobe was then used to identify the node that appeared abnormal on initial imaging that was marked with a HydroMARK clip and the seed.  Dissection was carried towards the radioactive seed sharply with the electrocautery with the direction of the  neoprobe.  I then excised the lymph node sharply with the electrocautery and the surrounding small vessels and lymphatics were controlled with clips.  I did see the HydroMARK clip during this dissection.  Specimen radiograph was obtained to confirm the presence of the seed.  This was sent as targeted node from the left axilla.  No other hot or palpable nodes were identified in the left axilla.  Hemostasis was achieved using the Bovie electrocautery.  The deep layer of the incision was then closed with interrupted 3-0 Vicryl stitches.  The skin was closed with a running 4-0 Monocryl subcuticular stitch.  Dermabond dressings were applied.  The patient tolerated the procedure well.  At the end of the case all needle sponge and instrument counts were correct.  The patient was then awakened and taken to recovery in stable condition.  PLAN OF CARE: Discharge to home after PACU  PATIENT DISPOSITION:  PACU - hemodynamically stable.   Delay start of Pharmacological VTE agent (>24hrs) due to surgical blood loss or risk of bleeding: not applicable

## 2021-08-04 ENCOUNTER — Encounter (HOSPITAL_BASED_OUTPATIENT_CLINIC_OR_DEPARTMENT_OTHER): Payer: Self-pay | Admitting: General Surgery

## 2021-08-04 HISTORY — PX: TUMOR REMOVAL: SHX12

## 2021-08-08 ENCOUNTER — Telehealth: Payer: Self-pay | Admitting: *Deleted

## 2021-08-08 NOTE — Telephone Encounter (Signed)
Sent request to pathology to repeat prognostic panel on her final path.

## 2021-08-09 ENCOUNTER — Encounter: Payer: Self-pay | Admitting: Allergy & Immunology

## 2021-08-09 ENCOUNTER — Ambulatory Visit (INDEPENDENT_AMBULATORY_CARE_PROVIDER_SITE_OTHER): Payer: 59 | Admitting: Allergy & Immunology

## 2021-08-09 ENCOUNTER — Inpatient Hospital Stay: Payer: 59 | Admitting: Licensed Clinical Social Worker

## 2021-08-09 ENCOUNTER — Other Ambulatory Visit (HOSPITAL_COMMUNITY): Payer: Self-pay

## 2021-08-09 VITALS — BP 136/88 | HR 82 | Temp 98.3°F | Resp 16 | Ht 68.0 in | Wt 203.8 lb

## 2021-08-09 DIAGNOSIS — L508 Other urticaria: Secondary | ICD-10-CM | POA: Diagnosis not present

## 2021-08-09 DIAGNOSIS — J4530 Mild persistent asthma, uncomplicated: Secondary | ICD-10-CM

## 2021-08-09 DIAGNOSIS — L2089 Other atopic dermatitis: Secondary | ICD-10-CM | POA: Diagnosis not present

## 2021-08-09 DIAGNOSIS — J310 Chronic rhinitis: Secondary | ICD-10-CM | POA: Diagnosis not present

## 2021-08-09 MED ORDER — FLUTICASONE-SALMETEROL 250-50 MCG/ACT IN AEPB
1.0000 | INHALATION_SPRAY | Freq: Two times a day (BID) | RESPIRATORY_TRACT | 5 refills | Status: DC
  Filled 2021-08-09: qty 60, 30d supply, fill #0
  Filled 2021-09-19: qty 60, 30d supply, fill #1
  Filled 2021-09-20: qty 60, 30d supply, fill #0

## 2021-08-09 MED ORDER — ALBUTEROL SULFATE HFA 108 (90 BASE) MCG/ACT IN AERS
2.0000 | INHALATION_SPRAY | RESPIRATORY_TRACT | 2 refills | Status: AC | PRN
  Filled 2021-08-09: qty 8.5, 17d supply, fill #0
  Filled 2022-01-04: qty 6.7, 17d supply, fill #1
  Filled 2022-02-20: qty 6.7, 17d supply, fill #2

## 2021-08-09 MED ORDER — LEVOCETIRIZINE DIHYDROCHLORIDE 5 MG PO TABS
5.0000 mg | ORAL_TABLET | Freq: Every evening | ORAL | 5 refills | Status: AC
  Filled 2021-08-09: qty 30, 30d supply, fill #0
  Filled 2021-09-19: qty 30, 30d supply, fill #1
  Filled 2021-09-20: qty 30, 30d supply, fill #0
  Filled 2022-01-04: qty 30, 30d supply, fill #1
  Filled 2022-02-26: qty 30, 30d supply, fill #2

## 2021-08-09 NOTE — Patient Instructions (Addendum)
1. Mild persistent asthma, uncomplicated - We did not do a lung testing today since you recently had surgery. - We will start a controller medication since you are having so many symptoms.  - Daily controller medication(s): Advair 250/87mg one puff twice daily - Prior to physical activity: albuterol 2 puffs 10-15 minutes before physical activity. - Rescue medications: albuterol 4 puffs every 4-6 hours as needed - Asthma control goals:  * Full participation in all desired activities (may need albuterol before activity) * Albuterol use two time or less a week on average (not counting use with activity) * Cough interfering with sleep two time or less a month * Oral steroids no more than once a year * No hospitalizations  2. Chronic rhinitis - We are going to get environmental allergy testing via the blood to see if it shows up there. - Try changing the Xyzal to at night.  - Continue with a nasal spray as needed.   3. Chronic urticaria - Your history does not have any "red flags" such as fevers, joint pains, or permanent skin changes that would be concerning for a more serious cause of hives.  - We will get some labs to rule out serious causes of hives: complete blood count, tryptase level, chronic urticaria panel, CMP, ESR, and CRP. - Chronic hives are often times a self limited process and will "burn themselves out" over 6-12 months, although this is not always the case.  - In the meantime, start suppressive dosing of antihistamines:   - Morning: Allegra (fexofenadine) 1811mIF NEEDED  - Evening: Xyzal (levocetirizine) 5 mg  - If the above is not working, try adding: Pepcid (famotidine) 2038m You can change this dosing at home, decreasing the dose as needed or increasing the dosing as needed.  - If you are not tolerating the medications or are tired of taking them every day, we can start treatment with a monthly injectable medication called Xolair.   4. Flexural atopic dermatitis -  Continue with the topical ointments that Dr. HalNevada Craneescribed.  - We are going to get a food panel to see whether this is flaring it.   5. Return in about 6 weeks (around 09/20/2021).    Please inform us [place] any Emergency Department visits, hospitalizations, or changes in symptoms. Call us [place] going to the ED for breathing or allergy symptoms since we might be able to fit you in for a sick visit. Feel free to contact us [place] with any questions, problems, or concerns.  It was a pleasure to meet you today!  Websites that have reliable patient information: 1. American Academy of Asthma, Allergy, and Immunology: www.aaaai.org 2. Food Allergy Research and Education (FARE): foodallergy.org 3. Mothers of Asthmatics: http://www.asthmacommunitynetwork.org 4. American College of Allergy, Asthma, and Immunology: www.acaai.org   COVID-19 Vaccine Information can be found at: [email or web address] questions related to vaccine distribution or appointments, please email [email or web address] or call [phone] We realize that you might be concerned about having an allergic reaction to the COVID19 vaccines. To help with that concern, WE ARE OFFERING THE COVID19 VACCINES IN OUR OFFICE! Ask the front desk for dates!     "Like" us [place] Facebook and Instagram for our latest updates!      A healthy democracy works best when ALLNew [place] Life Insurancerticipate! Make sure you are registered to vote! If you have moved or changed any of your contact information, you will need to get this updated before voting!  In some cases,  you MAY be able to register to vote online: [email or web address]

## 2021-08-09 NOTE — Progress Notes (Unsigned)
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  She has a remitting and relapsing course of her atopic disease going all the way back to childhood.  She has recently had almost a 6-year stent of quiescence of her atopic disease before it has come back relatively recently.  It is interesting that it seemed to come back after she stopped using caffeine.  Caffeine can certainly help with asthma, although the dose is needed to be therapeutic are rather high.  Regardless, we did not do lung testing because of her recent surgery so it is hard to for me to get a baseline regarding her asthma, but we will do that at future visits.  She reports that she has been using her mother's Advair with good results, so we will go ahead and give her her own prescription.  We are going to get lab work to look at weird causes of hives we will go ahead and get environmental allergy testing in the blood to, which can be less sensitive but might prove useful.  We are starting her on suppressive antihistamine doses to try to calm down her urticaria and this should also help her rhinitis.  She sees Dr. Nevada Crane for her eczema seems to have been under good control.  Plan/Recommendations:   1. Mild persistent asthma, uncomplicated - We did not do a lung testing today since you recently had surgery. - We will start a controller medication since you are having so many symptoms.  - Daily controller medication(s): Advair 250/44mg one puff twice daily - Prior to physical activity: albuterol 2 puffs 10-15  minutes before physical activity. - Rescue medications: albuterol 4 puffs every 4-6 hours as needed - Asthma control goals:  * Full participation in all desired activities (may need albuterol before activity) * Albuterol use two time or less a week on average (not counting use with activity) * Cough interfering with sleep two time or less a month * Oral steroids no more than once a year * No hospitalizations  2. Chronic rhinitis - We are going to get environmental allergy testing via the blood to see if it shows up there. - Try changing the Xyzal to at night.  - Continue with a nasal spray as needed.   3. Chronic urticaria - Your history does not have any "red flags" such as fevers, joint pains, or permanent skin changes that would be concerning for a more serious cause of hives.  - We will get some labs to rule out serious causes of hives: complete blood count, tryptase level, chronic urticaria panel, CMP, ESR, and CRP. - Chronic hives are often times a self limited process and will "burn themselves out" over 6-12 months, although this is not always the case.  - In the meantime, start suppressive dosing of antihistamines:   - Morning: Allegra (fexofenadine) 1867mIF NEEDED  - Evening: Xyzal (levocetirizine) 5 mg  - If the above is not working, try adding: Pepcid (famotidine) 2041m You can change this dosing at home, decreasing the dose as needed or increasing the dosing as needed.  -  If you are not tolerating the medications or are tired of taking them every day, we can start treatment with a monthly injectable medication called Xolair.   4. Flexural atopic dermatitis - Continue with the topical ointments that Dr. Nevada Crane prescribed.  - We are going to get a food panel to see whether this is flaring it.   5. Return in about 6 weeks (around 09/20/2021).    This note in its entirety was forwarded to the Provider who requested this consultation.  Subjective:   Darlene Peters is a 51  y.o. female presenting today for evaluation of  Chief Complaint  Patient presents with   Asthma    It is ok but it gets worse when she lies down or when she is resting.   Allergic Rhinitis     Not good. Not ready for allergy testing because she is not sure if her insurance will cover it. Patient called insurance and testing is covered.     Darlene Peters has a history of the following: Patient Active Problem List   Diagnosis Date Noted   Genetic testing 07/27/2021   Family history of breast cancer 07/19/2021   Family history of prostate cancer 07/19/2021   Anxiety 06/22/2021   Difficulty sleeping 06/22/2021   Major depressive disorder, single episode, unspecified 06/22/2021   Malignant neoplasm of upper-outer quadrant of left breast in female, estrogen receptor positive ([place]) 06/14/2021   Other fatigue 11/05/2019   Psoriasis 10/14/2019   Gastroesophageal reflux disease without esophagitis 05/01/2019   Low back pain 01/11/2017    History obtained from: chart review and patient, his history is rather all over the place which is understandable given her recent breast cancer diagnosis.  Darlene Peters was referred by Katherina Mires, MD.     Darlene Peters is a 51 y.o. female presenting for an evaluation of multiple atopic diseases .   She was diagnosed with cancer on March 28th, 2023.  She had surgery at the end of May to remove the lump and do a lymph node dissection.  They are planning to do radiation and then some chemotherapy.  She is followed by Dr. Arletha Pili.  Asthma/Respiratory Symptom History: She had asthma that has since gottne a lot worse since starting treatments for her cancer. It gets worse when she is lying around. She had this at birth. It seems to have become more quiescent most recently in 2017 and it came back now six years later. The only thing that has changed was her diet. She was an avid caffeine drinker. That was one month ago that she switched to decaf. She stopped  caffeine because her blood pressure increased. She feels better without it, aside from the worsened asthma control.  She has bene using her mother's Advair and this works well. She has been on theophylline in the distant past when she was a kid.    Allergic Rhinitis Symptom History: She has a history of environmental allergies.  She has sneezing and itchy watery eyes and runny nose. Her nose is the worst. It is drippy and some points and dry at other points.   Food Allergy Symptom History: She cut out gluten for a period of time and the itching stopped.  She has cut out sugar from her diet with improvement in her symptoms. She does feel better without gluten.  Skin Symptom History: She has a history of hives that started ages ago, around one year ago.  She recently started clobetasol and desonide  and triamcinolone. This was one week ago and was started to treat her eczema. This was started by Dr. Nevada Crane.   Otherwise, there is no history of other atopic diseases, including food allergies, drug allergies, stinging insect allergies, or contact dermatitis. There is no significant infectious history. Vaccinations are up to date.    Past Medical History: Patient Active Problem List   Diagnosis Date Noted   Genetic testing 07/27/2021   Family history of breast cancer 07/19/2021   Family history of prostate cancer 07/19/2021   Anxiety 06/22/2021   Difficulty sleeping 06/22/2021   Major depressive disorder, single episode, unspecified 06/22/2021   Malignant neoplasm of upper-outer quadrant of left breast in female, estrogen receptor positive ([place]) 06/14/2021   Other fatigue 11/05/2019   Psoriasis 10/14/2019   Gastroesophageal reflux disease without esophagitis 05/01/2019   Low back pain 01/11/2017    Medication List:  Allergies as of 08/09/2021       Reactions   Shellfish Allergy Rash   Gluten Meal    Latex         Medication List        Accurate as of August 09, 2021  6:51 PM. If you have  any questions, ask your nurse or doctor.          STOP taking these medications    aspirin-acetaminophen-caffeine 250-250-65 MG tablet Commonly known as: EXCEDRIN MIGRAINE Stopped by: Valentina Shaggy, MD   Astragalus Root Powd Stopped by: Valentina Shaggy, MD   calcipotriene 0.005 % ointment Commonly known as: DOVONOX Stopped by: Valentina Shaggy, MD   CordyMax CS-4 525 MG Caps Stopped by: Valentina Shaggy, MD   MULTIVITAMIN PO Stopped by: Valentina Shaggy, MD   omeprazole 20 MG capsule Commonly known as: PRILOSEC Stopped by: Valentina Shaggy, MD   oxyCODONE 5 MG immediate release tablet Commonly known as: Roxicodone Stopped by: Valentina Shaggy, MD   pimecrolimus 1 % cream Commonly known as: ELIDEL Stopped by: Valentina Shaggy, MD   Quercetin Complex Immune Caps Stopped by: Valentina Shaggy, MD   tacrolimus 0.1 % ointment Commonly known as: PROTOPIC Stopped by: Valentina Shaggy, MD       TAKE these medications    albuterol 108 (90 Base) MCG/ACT inhaler Commonly known as: Proventil HFA Inhale 2 puffs into the lungs every 4 (four) hours as needed for wheezing or shortness of breath. Started by: Valentina Shaggy, MD   buPROPion 150 MG 24 hr tablet Commonly known as: WELLBUTRIN XL Take 1 tablet (162m) by mouth once daily.   clobetasol 0.05 % external solution Commonly known as: TEMOVATE Apply topically to affected areas twice daily as needed (not to face,groin,or axilla)   desonide 0.05 % lotion Commonly known as: DESOWEN Apply topically to affected areas twice daily as needed (not to face,groin,or axilla)   fluticasone-salmeterol 250-50 MCG/ACT Aepb Commonly known as: Advair Diskus Inhale 1 puff into the lungs in the morning and at bedtime. Started by: JValentina Shaggy MD   gabapentin 100 MG capsule Commonly known as: Neurontin Take 1 capsule (100 mg total) by mouth 3 (three) times daily.    levocetirizine 5 MG tablet Commonly known as: XYZAL Take 1 tablet (5 mg total) by mouth every evening. Started by: JValentina Shaggy MD   triamcinolone cream 0.1 % Commonly known as: KENALOG Apply topically to affected areas twice daily as needed (not to face,groin,or axilla)        Birth History: non-contributory  Developmental History: non-contributory  Past Surgical History: Past Surgical History:  Procedure Laterality Date   ABDOMINAL HYSTERECTOMY     partial   BREAST BIOPSY Left 05/26/2021   BREAST LUMPECTOMY WITH RADIOACTIVE SEED AND SENTINEL LYMPH NODE BIOPSY Left 08/03/2021   Procedure: LEFT BREAST LUMPECTOMY WITH RADIOACTIVE SEED AND SENTINEL LYMPH NODE BIOPSY;  Surgeon: Jovita Kussmaul, MD;  Location: [facility];  Service: General;  Laterality: Left;   RADIOACTIVE SEED GUIDED AXILLARY SENTINEL LYMPH NODE Left 08/03/2021   Procedure: RADIOACTIVE SEED GUIDED LEFT AXILLARY SENTINEL LYMPH NODE DISSECTION;  Surgeon: Jovita Kussmaul, MD;  Location: [facility];  Service: General;  Laterality: Left;   TUMOR REMOVAL Left 08/2021   breast     Family History: Family History  Problem Relation Age of Onset   Allergic rhinitis Mother    Allergic rhinitis Sister    Allergic rhinitis Sister    Breast cancer Maternal Aunt 45   Prostate cancer Maternal Uncle        mets; dx after 50   Prostate cancer Paternal Uncle    Leukemia Paternal Uncle    Prostate cancer Paternal Uncle 7   Gastric cancer Paternal Uncle    Skin cancer Maternal Grandmother 76   Breast cancer Cousin 75       maternal cousin   Cervical cancer Cousin    Breast cancer Cousin        dx 21s; maternal female cousin   Breast cancer Cousin        paternal female cousin x2     Social History: Darlene Peters lives at home with her family.  She lives in a house that is 52 years old.  There is carpet throughout the home.  She has gas heating and central cooling.  There are dust mite  covers on the bedding.  There is no tobacco exposure.  She currently works for [employer] in [employer]  She is exposed to fumes, chemicals, and dust.  She does not have a HEPA filter in a house.  She does not live near an interstate or industrial area.  She is not a smoker..    Review of Systems  Constitutional: Negative.  Negative for chills, fever, malaise/fatigue and weight loss.  HENT:  Positive for congestion and sinus pain. Negative for ear discharge and ear pain.        Positive for postnasal drip.  Eyes:  Negative for pain, discharge and redness.  Respiratory:  Positive for cough and wheezing. Negative for sputum production and shortness of breath.   Cardiovascular: Negative.  Negative for chest pain and palpitations.  Gastrointestinal:  Negative for abdominal pain, heartburn, nausea and vomiting.  Skin: Negative.  Negative for itching and rash.  Neurological:  Negative for dizziness and headaches.  Endo/Heme/Allergies:  Negative for environmental allergies. Does not bruise/bleed easily.      Objective:   Blood pressure 136/88, pulse 82, temperature 98.3 F (36.8 C), temperature source Temporal, resp. rate 16, height 5' 8" (1.727 m), weight 203 lb 12.8 oz (92.4 kg), SpO2 97 %. Body mass index is 30.99 kg/m.     Physical Exam Vitals reviewed.  Constitutional:      Appearance: She is well-developed.     Comments: Very friendly.  HENT:     Head: Normocephalic and atraumatic.     Right Ear: Tympanic membrane, ear canal and external ear normal. No drainage, swelling or tenderness. Tympanic membrane is not injected, scarred, erythematous, retracted  or bulging.     Left Ear: Tympanic membrane, ear canal and external ear normal. No drainage, swelling or tenderness. Tympanic membrane is not injected, scarred, erythematous, retracted or bulging.     Nose: No nasal deformity, septal deviation, mucosal edema or rhinorrhea.     Right Turbinates: Enlarged, swollen  and pale.     Left Turbinates: Enlarged, swollen and pale.     Right Sinus: No maxillary sinus tenderness or frontal sinus tenderness.     Left Sinus: No maxillary sinus tenderness or frontal sinus tenderness.     Mouth/Throat:     Mouth: Mucous membranes are not pale and not dry.     Pharynx: Uvula midline.     Comments: Cobblestoning present in the posterior oropharynx. Eyes:     General:        Right eye: No discharge.        Left eye: No discharge.     Conjunctiva/sclera: Conjunctivae normal.     Right eye: Right conjunctiva is not injected. No chemosis.    Left eye: Left conjunctiva is not injected. No chemosis.    Pupils: Pupils are equal, round, and reactive to light.  Cardiovascular:     Rate and Rhythm: Normal rate and regular rhythm.     Heart sounds: Normal heart sounds.  Pulmonary:     Effort: Pulmonary effort is normal. No tachypnea, accessory muscle usage or respiratory distress.     Breath sounds: Normal breath sounds. No wheezing, rhonchi or rales.     Comments: Moving air well in all lung fields. Chest:     Chest wall: No tenderness.  Abdominal:     Tenderness: There is no abdominal tenderness. There is no guarding or rebound.  Lymphadenopathy:     Head:     Right side of head: No submandibular, tonsillar or occipital adenopathy.     Left side of head: No submandibular, tonsillar or occipital adenopathy.     Cervical: No cervical adenopathy.  Skin:    Coloration: Skin is not pale.     Findings: No abrasion, erythema, petechiae or rash. Rash is not papular, urticarial or vesicular.  Neurological:     Mental Status: She is alert.  Psychiatric:        Behavior: Behavior is cooperative.     Diagnostic studies: labs sent instead         Salvatore Marvel, MD [facility]

## 2021-08-10 ENCOUNTER — Encounter: Payer: Self-pay | Admitting: *Deleted

## 2021-08-10 ENCOUNTER — Encounter: Payer: Self-pay | Admitting: Allergy & Immunology

## 2021-08-12 ENCOUNTER — Ambulatory Visit: Payer: Self-pay | Admitting: Genetic Counselor

## 2021-08-12 ENCOUNTER — Telehealth: Payer: Self-pay | Admitting: Genetic Counselor

## 2021-08-12 DIAGNOSIS — Z803 Family history of malignant neoplasm of breast: Secondary | ICD-10-CM

## 2021-08-12 DIAGNOSIS — Z1379 Encounter for other screening for genetic and chromosomal anomalies: Secondary | ICD-10-CM

## 2021-08-12 DIAGNOSIS — Z8042 Family history of malignant neoplasm of prostate: Secondary | ICD-10-CM

## 2021-08-12 DIAGNOSIS — C50412 Malignant neoplasm of upper-outer quadrant of left female breast: Secondary | ICD-10-CM

## 2021-08-12 LAB — SURGICAL PATHOLOGY

## 2021-08-12 NOTE — Telephone Encounter (Signed)
Contacted patient in attempt to disclose results of genetic testing.  LVM with contact information requesting a call back.  

## 2021-08-12 NOTE — Telephone Encounter (Signed)
Revealed negative genetic testing.  Discussed that we do not know why she has breast cancer or why there is cancer in the family. It could be sporadic/famillial, due to a change in a gene that she did not inherit, due to a different gene that we are not testing, or maybe our current technology may not be able to pick something up.  It will be important for her to keep in contact with genetics to keep up with whether additional testing may be needed.     

## 2021-08-12 NOTE — Progress Notes (Signed)
HPI:   Ms. Larsen was previously seen in the [facility] due to a personal and family history of breast cancer and concerns regarding a hereditary predisposition to cancer. Please refer to our prior cancer genetics clinic note for more information regarding our discussion, assessment and recommendations, at the time. Ms. Hauth recent genetic test results were disclosed to her, as were recommendations warranted by these results. These results and recommendations are discussed in more detail below.  CANCER HISTORY:  In 2023, at the age of 52, Ms. Goguen was diagnosed with invasive ductal carcinoma of the left breast (ER+ 15% moderate/PR-/HER2-). The treatment plan includes breast conserving surgery (scheduled 5/31), Oncotype DX to determine potential benefit of chemotherapy, adjuvant radiation, and anti-estrogens.    Oncology History  Malignant neoplasm of upper-outer quadrant of left breast in female, estrogen receptor positive ([place])  06/14/2021 Initial Diagnosis   Malignant neoplasm of upper-outer quadrant of left breast in female, estrogen receptor positive ([place])   07/26/2021 Genetic Testing   Negative hereditary cancer genetic testing: no pathogenic variants detected in [facility] BRCAPlus Panel and CancerNext +RNAinsight Panel.  Report dates are Jul 26, 2021 and August 08, 2021.   The BRCAplus panel offered by [facility] and includes sequencing and deletion/duplication analysis for the following 8 genes: ATM, BRCA1, BRCA2, CDH1, CHEK2, PALB2, PTEN, and TP53.  The CancerNext gene panel offered by [facility] includes sequencing, rearrangement analysis, and RNA analysis for the following 36 genes:   APC, ATM, AXIN2, BARD1, BMPR1A, BRCA1, BRCA2, BRIP1, CDH1, CDK4, CDKN2A, CHEK2, DICER1, HOXB13, EPCAM, GREM1, MLH1, MSH2, MSH3, MSH6, MUTYH, NBN, NF1, NTHL1, PALB2, PMS2, POLD1, POLE, PTEN, RAD51C, RAD51D, RECQL, SMAD4, SMARCA4, STK11, and TP53.     FAMILY HISTORY:  We obtained a  detailed, 4-generation family history.  Significant diagnoses are listed below:      Family History  Problem Relation Age of Onset   Breast cancer Maternal Aunt 85   Prostate cancer Maternal Uncle          mets; dx after 50   Prostate cancer Paternal Uncle     Leukemia Paternal Uncle     Prostate cancer Paternal Uncle 34   Gastric cancer Paternal Uncle     Skin cancer Maternal Grandmother 53   Breast cancer Cousin 52        maternal cousin   Cervical cancer Cousin     Breast cancer Cousin          dx 63s; maternal cousin       Ms. Ricardo reported that two paternal cousins with a history of breast cancer had negative genetic testing. No other family history of genetic testing was reported. Ms. Zundel is unaware of previous family history of genetic testing for hereditary cancer risks. There is no known consanguinity.    GENETIC TEST RESULTS:  The [facility] CancerNext+RNAinsight Panel found no pathogenic mutations.   The CancerNext gene panel offered by [facility] includes sequencing, rearrangement analysis, and RNA analysis for the following 36 genes:   APC, ATM, AXIN2, BARD1, BMPR1A, BRCA1, BRCA2, BRIP1, CDH1, CDK4, CDKN2A, CHEK2, DICER1, HOXB13, EPCAM, GREM1, MLH1, MSH2, MSH3, MSH6, MUTYH, NBN, NF1, NTHL1, PALB2, PMS2, POLD1, POLE, PTEN, RAD51C, RAD51D, RECQL, SMAD4, SMARCA4, STK11, and TP53.  .   The test report has been scanned into EPIC and is located under the Molecular Pathology section of the Results Review tab.  A portion of the result report is included below for reference. Genetic testing reported out on August 08, 2021.     Even though a pathogenic variant was not identified, possible explanations for the cancer in the family may include: There may be no hereditary risk for cancer in the family. The cancers in Ms. Bomba and/or her family may be sporadic/familial or due to other genetic and environmental factors. There may be a gene mutation in one of these genes that current  testing methods cannot detect but that chance is small. There could be another gene that has not yet been discovered, or that we have not yet tested, that is responsible for the cancer diagnoses in the family.  It is also possible there is a hereditary cause for the cancer in the family that Ms. Berkey did not inherit.  Therefore, it is important to remain in touch with cancer genetics in the future so that we can continue to offer Ms. Pontarelli the most up to date genetic testing.   ADDITIONAL GENETIC TESTING:  We discussed with Ms. Blubaugh that her genetic testing was fairly extensive.  If there are additional relevant genes identified to increase cancer risk that can be analyzed in the future, we would be happy to discuss and coordinate this testing at that time.      CANCER SCREENING RECOMMENDATIONS:  Ms. Melamed test result is considered negative (normal).  This means that we have not identified a hereditary cause for her personal history of breast cancer at this time.   An individual's cancer risk and medical management are not determined by genetic test results alone. Overall cancer risk assessment incorporates additional factors, including personal medical history, family history, and any available genetic information that may result in a personalized plan for cancer prevention and surveillance. Therefore, it is recommended she continue to follow the cancer management and screening guidelines provided by her oncology and primary healthcare provider.  RECOMMENDATIONS FOR FAMILY MEMBERS:   Since she did not inherit a identifiable mutation in a cancer predisposition gene included on this panel, her children could not have inherited a known mutation from her in one of these genes. Individuals in this family might be at some increased risk of developing cancer, over the general population risk, due to the family history of cancer.  Individuals in the family should notify their providers of the family  history of cancer. We recommend women in this family have a yearly mammogram beginning at age 73, or 52 years younger than the earliest onset of cancer, an annual clinical breast exam, and perform monthly breast self-exams.   Other members of the family may still carry a pathogenic variant in one of these genes that Ms. Guadiana did not inherit. Based on the family history, we recommend her maternal relatives with breast cancer before the age of 14 have genetic counseling and testing. Ms. Karg will let us know if we can be of any assistance in coordinating genetic counseling and/or testing for these family members.     FOLLOW-UP:  Lastly, we discussed with Ms. Hoefer that cancer genetics is a rapidly advancing field and it is possible that new genetic tests will be appropriate for her and/or her family members in the future. We encouraged her to remain in contact with cancer genetics on an annual basis so we can update her personal and family histories and let her know of advances in cancer genetics that may benefit this family.   Our contact number was provided. Ms. Buccellato questions were answered to her satisfaction, and she knows she is welcome to call  us at anytime with additional questions or concerns.   Romulus Hanrahan M. Joette Catching, [place], [facility] Genetic Counselor [email or web address] (P) [phone]

## 2021-08-13 LAB — ALPHA-GAL PANEL
Allergen Lamb IgE: <0.1 kU/L
Beef IgE: <0.1 kU/L
IgE (Immunoglobulin E), Serum: 135 IU/mL (ref 6–495)
O215-IgE Alpha-Gal: <0.1 kU/L
Pork IgE: <0.1 kU/L

## 2021-08-15 ENCOUNTER — Other Ambulatory Visit (HOSPITAL_COMMUNITY): Payer: Self-pay

## 2021-08-15 ENCOUNTER — Encounter: Payer: Self-pay | Admitting: *Deleted

## 2021-08-15 ENCOUNTER — Telehealth: Payer: Self-pay | Admitting: *Deleted

## 2021-08-15 NOTE — Telephone Encounter (Signed)
Ordered oncotype per Dr. Chryl Heck. Faxed requisition to [facility].

## 2021-08-17 LAB — ALLERGEN PROFILE, BASIC FOOD
Allergen Corn, IgE: <0.1 kU/L
Beef IgE: <0.1 kU/L
Chocolate/Cacao IgE: <0.1 kU/L
Egg, Whole IgE: <0.1 kU/L
Food Mix (Seafoods) IgE: POSITIVE — AB
Milk IgE: <0.1 kU/L
Peanut IgE: <0.1 kU/L
Pork IgE: <0.1 kU/L
Soybean IgE: <0.1 kU/L
Wheat IgE: 0.27 kU/L — AB

## 2021-08-17 LAB — THYROID ANTIBODIES
Thyroglobulin Antibody: <1 IU/mL (ref 0.0–0.9)
Thyroperoxidase Ab SerPl-aCnc: <9 IU/mL (ref 0–34)

## 2021-08-17 LAB — ALLERGENS W/COMP RFLX AREA 2
Alternaria Alternata IgE: <0.1 kU/L
Aspergillus Fumigatus IgE: <0.1 kU/L
Bermuda Grass IgE: 3.19 kU/L — AB
Cedar, Mountain IgE: 2.18 kU/L — AB
Cladosporium Herbarum IgE: <0.1 kU/L
Cockroach, German IgE: 0.38 kU/L — AB
Common Silver Birch IgE: <0.1 kU/L
Cottonwood IgE: <0.1 kU/L
D Farinae IgE: 13.4 kU/L — AB
D Pteronyssinus IgE: 13.5 kU/L — AB
E001-IgE Cat Dander: <0.1 kU/L
E005-IgE Dog Dander: 0.14 kU/L — AB
Elm, American IgE: <0.1 kU/L
IgE (Immunoglobulin E), Serum: 138 IU/mL (ref 6–495)
Johnson Grass IgE: 3.55 kU/L — AB
Maple/Box Elder IgE: <0.1 kU/L
Mouse Urine IgE: <0.1 kU/L
Oak, White IgE: <0.1 kU/L
Pecan, Hickory IgE: <0.1 kU/L
Penicillium Chrysogen IgE: <0.1 kU/L
Pigweed, Rough IgE: <0.1 kU/L
Ragweed, Short IgE: 0.16 kU/L — AB
Sheep Sorrel IgE Qn: <0.1 kU/L
Timothy Grass IgE: 5.41 kU/L — AB
White Mulberry IgE: <0.1 kU/L

## 2021-08-17 LAB — CBC WITH DIFFERENTIAL
Basophils Absolute: 0 10*3/uL (ref 0.0–0.2)
Basos: 1 %
EOS (ABSOLUTE): 0.2 10*3/uL (ref 0.0–0.4)
Eos: 2 %
Hematocrit: 40.9 % (ref 34.0–46.6)
Hemoglobin: 14 g/dL (ref 11.1–15.9)
Immature Grans (Abs): 0 10*3/uL (ref 0.0–0.1)
Immature Granulocytes: 0 %
Lymphocytes Absolute: 1 10*3/uL (ref 0.7–3.1)
Lymphs: 16 %
MCH: 31.1 pg (ref 26.6–33.0)
MCHC: 34.2 g/dL (ref 31.5–35.7)
MCV: 91 fL (ref 79–97)
Monocytes Absolute: 0.4 10*3/uL (ref 0.1–0.9)
Monocytes: 7 %
Neutrophils Absolute: 4.7 10*3/uL (ref 1.4–7.0)
Neutrophils: 74 %
RBC: 4.5 x10E6/uL (ref 3.77–5.28)
RDW: 13.2 % (ref 11.7–15.4)
WBC: 6.3 10*3/uL (ref 3.4–10.8)

## 2021-08-17 LAB — ALLERGEN PROFILE, MOLD
Aureobasidi Pullulans IgE: <0.1 kU/L
Candida Albicans IgE: <0.1 kU/L
M009-IgE Fusarium proliferatum: <0.1 kU/L
M014-IgE Epicoccum purpur: <0.1 kU/L
Mucor Racemosus IgE: <0.1 kU/L
Phoma Betae IgE: <0.1 kU/L
Setomelanomma Rostrat: <0.1 kU/L
Stemphylium Herbarum IgE: <0.1 kU/L

## 2021-08-17 LAB — CMP14+EGFR
ALT: 16 IU/L (ref 0–32)
AST: 14 IU/L (ref 0–40)
Albumin/Globulin Ratio: 2 (ref 1.2–2.2)
Albumin: 4.3 g/dL (ref 3.8–4.9)
Alkaline Phosphatase: 51 IU/L (ref 44–121)
BUN/Creatinine Ratio: 15 (ref 9–23)
BUN: 11 mg/dL (ref 6–24)
Bilirubin Total: 0.9 mg/dL (ref 0.0–1.2)
CO2: 23 mmol/L (ref 20–29)
Calcium: 9.2 mg/dL (ref 8.7–10.2)
Chloride: 105 mmol/L (ref 96–106)
Creatinine, Ser: 0.71 mg/dL (ref 0.57–1.00)
Globulin, Total: 2.2 g/dL (ref 1.5–4.5)
Glucose: 86 mg/dL (ref 70–99)
Potassium: 3.9 mmol/L (ref 3.5–5.2)
Sodium: 140 mmol/L (ref 134–144)
Total Protein: 6.5 g/dL (ref 6.0–8.5)
eGFR: 103 mL/min/{1.73_m2} (ref >59)

## 2021-08-17 LAB — C-REACTIVE PROTEIN: CRP: 1 mg/L (ref 0–10)

## 2021-08-17 LAB — SEDIMENTATION RATE: Sed Rate: 10 mm/hr (ref 0–40)

## 2021-08-17 LAB — ANTINUCLEAR ANTIBODIES, IFA: ANA Titer 1: NEGATIVE

## 2021-08-17 LAB — CHRONIC URTICARIA: cu index: <1 (ref <10)

## 2021-08-17 LAB — TRYPTASE: Tryptase: 9.1 ug/L (ref 2.2–13.2)

## 2021-08-23 ENCOUNTER — Ambulatory Visit: Payer: Self-pay | Admitting: Allergy & Immunology

## 2021-08-23 ENCOUNTER — Encounter (HOSPITAL_COMMUNITY): Payer: Self-pay

## 2021-08-24 ENCOUNTER — Telehealth: Payer: Self-pay | Admitting: Hematology and Oncology

## 2021-08-24 ENCOUNTER — Other Ambulatory Visit: Payer: Self-pay

## 2021-08-24 MED ORDER — BUPROPION HCL ER (XL) 300 MG PO TB24
ORAL_TABLET | ORAL | 0 refills | Status: DC
  Filled 2021-08-24 – 2021-08-31 (×2): qty 30, 30d supply, fill #0

## 2021-08-24 NOTE — Therapy (Signed)
OUTPATIENT PHYSICAL THERAPY BREAST CANCER POST OP FOLLOW UP   Patient Name: Darlene Peters MRN: [id number] DOB:1971-03-04, 51 y.o., female Today's Date: 08/25/2021   PT End of Session - 08/25/21 1214     Visit Number 2    Number of Visits 14    Date for PT Re-Evaluation 10/06/21    PT Start Time 0907    PT Stop Time 0948    PT Time Calculation (min) 41 min    Activity Tolerance Patient tolerated treatment well    Behavior During Therapy [facility] for tasks assessed/performed             Past Medical History:  Diagnosis Date   Asthma    "worse when lying down"   Breast cancer ([place])    Family history of breast cancer 07/19/2021   Family history of prostate cancer 07/19/2021   GERD (gastroesophageal reflux disease)    Psoriasis    Urticaria    Past Surgical History:  Procedure Laterality Date   ABDOMINAL HYSTERECTOMY     partial   BREAST BIOPSY Left 05/26/2021   BREAST LUMPECTOMY WITH RADIOACTIVE SEED AND SENTINEL LYMPH NODE BIOPSY Left 08/03/2021   Procedure: LEFT BREAST LUMPECTOMY WITH RADIOACTIVE SEED AND SENTINEL LYMPH NODE BIOPSY;  Surgeon: Jovita Kussmaul, MD;  Location: [place];  Service: General;  Laterality: Left;   RADIOACTIVE SEED GUIDED AXILLARY SENTINEL LYMPH NODE Left 08/03/2021   Procedure: RADIOACTIVE SEED GUIDED LEFT AXILLARY SENTINEL LYMPH NODE DISSECTION;  Surgeon: Jovita Kussmaul, MD;  Location: [place];  Service: General;  Laterality: Left;   TUMOR REMOVAL Left 08/2021   breast   Patient Active Problem List   Diagnosis Date Noted   Genetic testing 07/27/2021   Family history of breast cancer 07/19/2021   Family history of prostate cancer 07/19/2021   Anxiety 06/22/2021   Difficulty sleeping 06/22/2021   Major depressive disorder, single episode, unspecified 06/22/2021   Malignant neoplasm of upper-outer quadrant of left breast in female, estrogen receptor positive ([place]) 06/14/2021   Other fatigue 11/05/2019    Psoriasis 10/14/2019   Gastroesophageal reflux disease without esophagitis 05/01/2019   Low back pain 01/11/2017    PCP: Suzanna Obey, MD  REFERRING PROVIDER: Jovita Kussmaul, MD  REFERRING DIAG: Left Breast Cancer  THERAPY DIAG:  Malignant neoplasm of upper-outer quadrant of left breast in female, estrogen receptor positive ([place])  Abnormal posture  Stiffness of left shoulder, not elsewhere classified  Rationale for Evaluation and Treatment Rehabilitation  ONSET DATE: 05/31/2021  SUBJECTIVE:  SUBJECTIVE STATEMENT: Everything went well, but I am pain under my arm and my breast. I have been doing the exercises every day like mopping etc. I have some pain under my shoulder blade and under arm, and some in my breast.  PERTINENT HISTORY:  Patient was diagnosed on 05/31/2021 with left Breast Cancer. It measures 2.6 cm and is located in the upper-outer quadrant. It is ER+,PR-, HER 2 - with a Ki67 of <5%.  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  PAIN:  Are you having pain? Yes: NPRS scale: 8/10 Pain location: left shoulder/under arm, scapula Pain description: some shooting, aching, sharp Aggravating factors: sleeping, sitting, using arm, driving Relieving factors: standing,  PRECAUTIONS: Recent Surgery, left UE Lymphedema risk, Other: back pain  ACTIVITY LEVEL / LEISURE: cleaning with right hand,bathing,   OBJECTIVE:   PATIENT SURVEYS:  QUICK DASH: 82%  OBSERVATIONS:  Scar tissue noted under axillary incision, scabs still present at both incisions so not ready for scar massage yet, generalized swelling at left  breast. Axillary cording noted PALPATION: Very sensitive/tender at left breast  POSTURE:  Forward head rounded shoulders  LYMPHEDEMA ASSESSMENT:     UPPER EXTREMITY AROM/PROM:   A/PROM RIGHT  06/20/2021    Shoulder extension 62  Shoulder flexion 150  Shoulder abduction 165  Shoulder internal rotation 75  Shoulder external rotation 105                          (Blank rows = not tested)   A/PROM LEFT  06/20/2021 LEFT  08/25/2021  Shoulder extension 38, back of arm 32 sore  Shoulder flexion 130 118 sore/pain  Shoulder abduction 145 105  Shoulder internal rotation NT   Shoulder external rotation 80 sharp pains in neck 75                          (Blank rows = not tested)     CERVICAL AROM: All within normal limits except slight limitation with right rotation with complaints of sharp pain in neck and down left arm, improved after ROM exercises;otherwise WNL         UPPER EXTREMITY STRENGTH: WNL     LYMPHEDEMA ASSESSMENTS:    LANDMARK RIGHT  06/20/2021  10 cm proximal to olecranon process 31.0  Olecranon process 25.7  10 cm proximal to ulnar styloid process 20.3  Just proximal to ulnar styloid process 15.1  Across hand at thumb web space 20.1  At base of 2nd digit 5.9  (Blank rows = not tested)   LANDMARK LEFT  06/20/2021 Left 08/25/2021  10 cm proximal to olecranon process 30.2 30.0  Olecranon process 26.1 25.1  10 cm proximal to ulnar styloid process 19.1 18.8  Just proximal to ulnar styloid process 14.7 14.6  Across hand at thumb web space 19.3 18.9  At base of 2nd digit 6.0 5.7  (Blank rows = not tested)      Surgery type/Date:08/03/2021 Left Lumpectomy with deep axillary SLNB Number of lymph nodes removed: 0/6 Current/past treatment (chemo, radiation, hormone therapy): will have radiation Other symptoms:  Heaviness/tightness Yesleft breast Pain Yes Pitting edema No Infections No Decreased scar mobility Yes Stemmer sign No   PATIENT EDUCATION:   Education details: compression bra  and new script given, ABC class, SOZO screen, importance of doing stretches, no scar massage yet, reviewed post op exercises Person educated: Patient Education method: Explanation, Demonstration, Verbal cues, and Handouts Education comprehension: returned demonstration   HOME EXERCISE PROGRAM:  Reviewed previously given post op HEP. Pt had not been doing clasped hands flexion or abd wall slides, but had been doing flexion wall slides. Educated on importance and gave another copy of post op exercises  ASSESSMENT:  CLINICAL IMPRESSION: Pt is s/p left lumpectomy on 08/03/2021 with SLNB.  She is very guarded about the left shoulder and has limitations in AROM. She has some mild axillary cording and numbness in the posterior arm. There is generalized left lateral breast swelling and she was advised to purchase a compression bra and was given another script to purchase. There are no present concerns for UE lymphedema. Scabs are still present over both incisions and are not ready for scar massage yet.She will benefit from skilled PT to address ROM, cording and breast swelling to return to PLOF  Pt will benefit from skilled therapeutic intervention to improve on the following deficits: Decreased knowledge of precautions, impaired UE functional use, pain, decreased ROM, postural dysfunction.   PT treatment/interventions: ADL/Self care home management, Therapeutic exercises, Therapeutic activity, Patient/Family education, Joint mobilization, Orthotic/Fit training, Aquatic Therapy, Manual lymph drainage, scar mobilization, and Manual therapy     GOALS: Goals reviewed with patient? Yes  LONG TERM GOALS:  (STG=LTG)  GOALS Name Target Date  Goal status  1 Pt will demonstrate she has regained full shoulder ROM and function post operatively compared to baselines.  Baseline: 10/06/2021 IN PROGRESS  2 Left axillary cording will be resolved 10/06/2021 INITIAL  3 Pt will  improve quick dash to no greater than 20% 10/06/2021 INITIAL  4 Pt will have arm/shoulder pain no greater than 3/10 10/06/2021 INITIAL     PLAN: PT FREQUENCY/DURATION: 2x/week for 6 weeks  PLAN FOR NEXT SESSION: STM to left upper quarter, review HEP, AA/PROM, LTR. Progress to strength as tolerated.   [facility]  [address], [address]  [place] [address]  [phone]  After Breast Cancer Class It is recommended you attend the ABC class to be educated on lymphedema risk reduction. This class is free of charge and lasts for 1 hour. It is a 1-time class. You will need to download the Webex app either on your phone or computer. We will send you a link the night before or the morning of the class. You should be able to click on that link to join the class. This is not a confidential class. You don't have to turn your camera on, but other participants may be able to see your email address.  Scar massage when scabs fall off You can begin gentle scar massage to you incision sites. Gently place one hand on the incision and move the skin (without sliding on the skin) in various directions. Do this for a few minutes and then you can gently massage either coconut oil or vitamin E cream into the scars.  Compression garment You should continue wearing your compression bra until you feel like you no longer have swelling.  Home exercise Program Continue doing the exercises you were given until you feel like you can do them without feeling any tightness at the end.   Walking Program Studies show that 30 minutes of walking per day (fast enough to elevate your heart rate) can significantly reduce the risk of a  cancer recurrence. If you can't walk due to other medical reasons, we encourage you to find another activity you could do (like a stationary bike or water exercise).  Posture After breast cancer surgery, people frequently sit with rounded shoulders posture because it puts  their incisions on slack and feels better. If you sit like this and scar tissue forms in that position, you can become very tight and have pain sitting or standing with good posture. Try to be aware of your posture and sit and stand up tall to heal properly.  Follow up PT: It is recommended you return every 3 months for the first 3 years following surgery to be assessed on the SOZO machine for an L-Dex score. This helps prevent clinically significant lymphedema in 95% of patients. These follow up screens are 10 minute appointments that you are not billed for.  Claris Pong, PT 08/25/2021, 12:15 PM

## 2021-08-24 NOTE — Telephone Encounter (Signed)
Scheduled appointment per providers request. Left message with appointment details.

## 2021-08-25 ENCOUNTER — Ambulatory Visit: Payer: 59 | Attending: General Surgery

## 2021-08-25 DIAGNOSIS — C50412 Malignant neoplasm of upper-outer quadrant of left female breast: Secondary | ICD-10-CM | POA: Diagnosis present

## 2021-08-25 DIAGNOSIS — R293 Abnormal posture: Secondary | ICD-10-CM | POA: Diagnosis present

## 2021-08-25 DIAGNOSIS — Z170 Estrogen receptor positive status [ER+]: Secondary | ICD-10-CM | POA: Insufficient documentation

## 2021-08-25 DIAGNOSIS — M25612 Stiffness of left shoulder, not elsewhere classified: Secondary | ICD-10-CM | POA: Diagnosis present

## 2021-08-25 NOTE — Patient Instructions (Signed)
     [facility]  [address], [address]  [place] [address]  [phone]  After Breast Cancer Class It is recommended you attend the ABC class to be educated on lymphedema risk reduction. This class is free of charge and lasts for 1 hour. It is a 1-time class. You will need to download the Webex app either on your phone or computer. We will send you a link the night before or the morning of the class. You should be able to click on that link to join the class. This is not a confidential class. You don't have to turn your camera on, but other participants may be able to see your email address.  Scar massage You can begin gentle scar massage to you incision sites. Gently place one hand on the incision and move the skin (without sliding on the skin) in various directions. Do this for a few minutes and then you can gently massage either coconut oil or vitamin E cream into the scars.  Compression garment You should continue wearing your compression bra until you feel like you no longer have swelling.  Home exercise Program Continue doing the exercises you were given until you feel like you can do them without feeling any tightness at the end.   Walking Program Studies show that 30 minutes of walking per day (fast enough to elevate your heart rate) can significantly reduce the risk of a cancer recurrence. If you can't walk due to other medical reasons, we encourage you to find another activity you could do (like a stationary bike or water exercise).  Posture After breast cancer surgery, people frequently sit with rounded shoulders posture because it puts their incisions on slack and feels better. If you sit like this and scar tissue forms in that position, you can become very tight and have pain sitting or standing with good posture. Try to be aware of your posture and sit and stand up tall to heal properly.  Follow up PT: It is recommended you return every 3 months for  the first 2 years following surgery to be assessed on the SOZO machine for an L-Dex score. This helps prevent clinically significant lymphedema in 95% of patients. These follow up screens are 10 minute appointments that you are not billed for.  

## 2021-08-26 ENCOUNTER — Telehealth: Payer: Self-pay | Admitting: *Deleted

## 2021-08-30 ENCOUNTER — Inpatient Hospital Stay: Payer: 59 | Attending: Hematology and Oncology | Admitting: Hematology and Oncology

## 2021-08-30 ENCOUNTER — Encounter: Payer: Self-pay | Admitting: Hematology and Oncology

## 2021-08-30 ENCOUNTER — Other Ambulatory Visit: Payer: Self-pay

## 2021-08-30 ENCOUNTER — Ambulatory Visit: Payer: 59

## 2021-08-30 DIAGNOSIS — Z803 Family history of malignant neoplasm of breast: Secondary | ICD-10-CM | POA: Diagnosis not present

## 2021-08-30 DIAGNOSIS — Z800 Family history of malignant neoplasm of digestive organs: Secondary | ICD-10-CM | POA: Insufficient documentation

## 2021-08-30 DIAGNOSIS — C50412 Malignant neoplasm of upper-outer quadrant of left female breast: Secondary | ICD-10-CM | POA: Insufficient documentation

## 2021-08-30 DIAGNOSIS — F32A Depression, unspecified: Secondary | ICD-10-CM | POA: Diagnosis not present

## 2021-08-30 DIAGNOSIS — Z170 Estrogen receptor positive status [ER+]: Secondary | ICD-10-CM | POA: Diagnosis not present

## 2021-08-30 DIAGNOSIS — Z79899 Other long term (current) drug therapy: Secondary | ICD-10-CM | POA: Diagnosis not present

## 2021-08-30 NOTE — Assessment & Plan Note (Signed)
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 She is now postsurgery and is here to discuss Oncotype results and role of chemotherapy.  We have discussed final pathology as well as Oncotype score which resulted at 28, distant recurrence risk at 9 years of 17%, absolute benefit of chemotherapy greater than 15%. Given young age, excellent PS, Oncotype score 28, we have discussed about adjuvant chemotherapy with TC every 21 days for 4 cycles.  We have discussed about adverse effects of chemotherapy including but not limited to fatigue, nausea, alopecia vomiting, increased risk of infections, neuropathy etc.  She understands that some of the side effects can be permanent.   She is agreeable to chemotherapy, however since she continues to be in significant pain, I will reevaluate her in about 2 weeks.  She is also going through menopause, emotional lability from menopause, tolerating Wellbutrin well at this time.  All her questions were answered to the best my knowledge. Return to clinic in 2 weeks.

## 2021-08-31 ENCOUNTER — Other Ambulatory Visit: Payer: Self-pay | Admitting: Pharmacist

## 2021-08-31 ENCOUNTER — Encounter: Payer: Self-pay | Admitting: Hematology and Oncology

## 2021-08-31 ENCOUNTER — Other Ambulatory Visit: Payer: Self-pay

## 2021-08-31 ENCOUNTER — Telehealth: Payer: Self-pay | Admitting: Hematology and Oncology

## 2021-08-31 NOTE — Addendum Note (Signed)
Addended by: Tora Kindred on: 08/31/2021 09:57 AM   Modules accepted: Orders

## 2021-08-31 NOTE — Telephone Encounter (Signed)
Scheduled appointment per 06/27 los. Patient aware.  

## 2021-08-31 NOTE — Chronic Care Management (AMB) (Signed)
Patient seen by Tanja Port, PharmD Candidate on 08/31/21 while they were picking up prescriptions at [place] at [facility].   Patient has an automated home blood pressure machine, but has not been using  Medication review was performed. They are not taking medications as prescribed- reported that she has stopped all medications and has cost concerns and transportation concerns   The following barriers to adherence were noted: - Affordability, - Transportation, and - Forgetting to take medications  The following interventions were completed:  - Medications were reviewed - Patient was educated on medications, including indication and administration - Patient was educated on proper technique to check home blood pressure and reminded to bring home machine and readings to next provider appointment - Patient was educated on how to access home blood pressure machine - Patient was counseled on lifestyle modifications to improve blood pressure  Discussed symptoms of high and low BP, diet, exercise, salt intake, improtance of adherance, BP cuff use and logging, fluid intake and sleep habbits. Gave BP log, BP measuring guide and lifestyle modifications packet. Reviewed how to measure BP at home and that she can take her log and BP machine with her for her next PCP visit  The patient has follow up scheduled:  PCP: will schedule  Will collaborate with hem/onc team to support the patient  Catie Hedwig Morton, PharmD, [place] [phone]

## 2021-09-01 ENCOUNTER — Encounter: Payer: Self-pay | Admitting: *Deleted

## 2021-09-01 ENCOUNTER — Ambulatory Visit: Payer: 59

## 2021-09-01 DIAGNOSIS — C50412 Malignant neoplasm of upper-outer quadrant of left female breast: Secondary | ICD-10-CM | POA: Diagnosis not present

## 2021-09-01 DIAGNOSIS — M25612 Stiffness of left shoulder, not elsewhere classified: Secondary | ICD-10-CM

## 2021-09-01 DIAGNOSIS — R293 Abnormal posture: Secondary | ICD-10-CM

## 2021-09-01 NOTE — Therapy (Signed)
OUTPATIENT PHYSICAL THERAPY BREAST CANCER TREATMENT   Patient Name: Darlene Peters MRN: [id number] DOB:1970-07-04, 51 y.o., female Today's Date: 09/01/2021   PT End of Session - 09/01/21 1457     Visit Number 3    Number of Visits 14    Date for PT Re-Evaluation 10/06/21    PT Start Time 1500    PT Stop Time 1555    PT Time Calculation (min) 55 min    Activity Tolerance Patient tolerated treatment well    Behavior During Therapy [facility] for tasks assessed/performed             Past Medical History:  Diagnosis Date   Asthma    "worse when lying down"   Breast cancer ([place])    Family history of breast cancer 07/19/2021   Family history of prostate cancer 07/19/2021   GERD (gastroesophageal reflux disease)    Psoriasis    Urticaria    Past Surgical History:  Procedure Laterality Date   ABDOMINAL HYSTERECTOMY     partial   BREAST BIOPSY Left 05/26/2021   BREAST LUMPECTOMY WITH RADIOACTIVE SEED AND SENTINEL LYMPH NODE BIOPSY Left 08/03/2021   Procedure: LEFT BREAST LUMPECTOMY WITH RADIOACTIVE SEED AND SENTINEL LYMPH NODE BIOPSY;  Surgeon: Jovita Kussmaul, MD;  Location: [place];  Service: General;  Laterality: Left;   RADIOACTIVE SEED GUIDED AXILLARY SENTINEL LYMPH NODE Left 08/03/2021   Procedure: RADIOACTIVE SEED GUIDED LEFT AXILLARY SENTINEL LYMPH NODE DISSECTION;  Surgeon: Jovita Kussmaul, MD;  Location: [place];  Service: General;  Laterality: Left;   TUMOR REMOVAL Left 08/2021   breast   Patient Active Problem List   Diagnosis Date Noted   Genetic testing 07/27/2021   Family history of breast cancer 07/19/2021   Family history of prostate cancer 07/19/2021   Anxiety 06/22/2021   Difficulty sleeping 06/22/2021   Major depressive disorder, single episode, unspecified 06/22/2021   Malignant neoplasm of upper-outer quadrant of left breast in female, estrogen receptor positive ([place]) 06/14/2021   Other fatigue 11/05/2019   Psoriasis  10/14/2019   Gastroesophageal reflux disease without esophagitis 05/01/2019   Low back pain 01/11/2017    PCP: Suzanna Obey, MD  REFERRING PROVIDER: Jovita Kussmaul, MD  REFERRING DIAG: Left Breast Cancer  THERAPY DIAG:  Malignant neoplasm of upper-outer quadrant of left breast in female, estrogen receptor positive ([place])  Abnormal posture  Stiffness of left shoulder, not elsewhere classified  Rationale for Evaluation and Treatment Rehabilitation  ONSET DATE: 05/31/2021  SUBJECTIVE:  SUBJECTIVE STATEMENT:  I have been doing the exercises a few times a day. I feel like my shoulder motion is doing better, but its hard to wash my back still. My breast hurts to laugh and it burns to put on deoderant. I tried to get appt for the compression bra but they said not to come for 6 weeks.  I start chemo in 2 weeks q 21 days for 4 cycles   PERTINENT HISTORY:  Patient was diagnosed on 05/31/2021 with left Breast Cancer. It measures 2.6 cm and is located in the upper-outer quadrant. It is ER+,PR-, HER 2 - with a Ki67 of <5%.  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  PAIN:  Are you having pain? Yes: NPRS scale: 8/10 Pain location: left shoulder/under arm, scapula Pain description: some shooting, aching, sharp Aggravating factors: sleeping, sitting, using arm, driving Relieving factors: standing,  PRECAUTIONS: Recent Surgery, left UE Lymphedema risk, Other: back pain  ACTIVITY LEVEL / LEISURE: cleaning with right hand,bathing,   OBJECTIVE:   PATIENT SURVEYS:  QUICK DASH: 82%  OBSERVATIONS:  Scar tissue noted under  axillary incision, scabs still present at both incisions so not ready for scar massage yet, generalized swelling at left breast. Axillary cording noted PALPATION: Very sensitive/tender at left breast  POSTURE:  Forward head rounded shoulders  LYMPHEDEMA ASSESSMENT:     UPPER EXTREMITY AROM/PROM:   A/PROM RIGHT  06/20/2021    Shoulder extension 62  Shoulder flexion 150  Shoulder abduction 165  Shoulder internal rotation 75  Shoulder external rotation 105                          (Blank rows = not tested)   A/PROM LEFT  06/20/2021 LEFT  08/25/2021  Shoulder extension 38, back of arm 32 sore  Shoulder flexion 130 118 sore/pain  Shoulder abduction 145 105  Shoulder internal rotation NT   Shoulder external rotation 80 sharp pains in neck 75                          (Blank rows = not tested)     CERVICAL AROM: All within normal limits except slight limitation with right rotation with complaints of sharp pain in neck and down left arm, improved after ROM exercises;otherwise WNL         UPPER EXTREMITY STRENGTH: WNL     LYMPHEDEMA ASSESSMENTS:    LANDMARK RIGHT  06/20/2021  10 cm proximal to olecranon process 31.0  Olecranon process 25.7  10 cm proximal to ulnar styloid process 20.3  Just proximal to ulnar styloid process 15.1  Across hand at thumb web space 20.1  At base of 2nd digit 5.9  (Blank rows = not tested)   LANDMARK LEFT  06/20/2021 Left 08/25/2021  10 cm proximal to olecranon process 30.2 30.0  Olecranon process 26.1 25.1  10 cm proximal to ulnar styloid process 19.1 18.8  Just proximal to ulnar styloid process 14.7 14.6  Across hand at thumb web space 19.3 18.9  At base of 2nd digit 6.0 5.7  (Blank rows = not tested)      Surgery type/Date:08/03/2021 Left Lumpectomy with deep axillary SLNB Number of lymph nodes removed: 0/6 Current/past treatment (chemo, radiation, hormone therapy):  will have radiation Other symptoms:  Heaviness/tightness Yesleft  breast Pain Yes Pitting edema No Infections No Decreased scar mobility Yes Stemmer sign No     TREATMENT TODAY 09/01/2021 Soft tissue mobilization to left pectorals,UT, and lats in supine and in SL to left scapular area. PROM to left shoulder flexion, scaption, ABD, ER. MFR techniques to left arm area of cording. Supine AROM shoulder flexion, scaption, horizontal abduction x5 Lower trunk rotation with arms extended Pt was educated in desensitization techniques for the axillary region  PATIENT EDUCATION:  Education details: compression bra and new script given, ABC class, SOZO screen, importance of doing stretches, no scar massage yet, reviewed post op exercises Person educated: Patient Education method: Explanation, Demonstration, Verbal cues, and Handouts Education comprehension: returned demonstration   HOME EXERCISE PROGRAM:  Reviewed previously given post op HEP. Pt had not been doing clasped hands flexion or abd wall slides, but had been doing flexion wall slides. Educated on importance and gave another copy of post op exercises  ASSESSMENT:  CLINICAL IMPRESSION: Pt has tenderness at left pectorals and lats with STM.  Shoulder ROM is visibly improved today and pt relaxed well for PROM after VC's. She was instructed in use of different fabrics for hypersensitivity under her arm. Mid axillary cord appears gone, however,she does have a cord from the lateral upper arm into the forearm that radiates to the thumb and will benefit from release techniques  Pt will benefit from skilled therapeutic intervention to improve on the following deficits: Decreased knowledge of precautions, impaired UE functional use, pain, decreased ROM, postural dysfunction.   PT treatment/interventions: ADL/Self care home management, Therapeutic exercises, Therapeutic activity, Patient/Family education, Joint mobilization, Orthotic/Fit training, Aquatic Therapy, Manual lymph drainage, scar mobilization, and  Manual therapy     GOALS: Goals reviewed with patient? Yes  LONG TERM GOALS:  (STG=LTG)  GOALS Name Target Date  Goal status  1 Pt will demonstrate she has regained full shoulder ROM and function post operatively compared to baselines.  Baseline: 10/13/2021 IN PROGRESS  2 Left axillary cording will be resolved 10/06/2021 INITIAL  3 Pt will improve quick dash to no greater than 20% 10/13/2021 INITIAL  4 Pt will have arm/shoulder pain no greater than 3/10 10/13/2021 INITIAL     PLAN: PT FREQUENCY/DURATION: 2x/week for 6 weeks  PLAN FOR NEXT SESSION: STM to left upper quarter, MFR to upper arm cord, review HEP, AA/PROM, pics of LTR.  Check breast swelling,Progress to strength as tolerated.   [facility]  [address]  [place] [address]  [phone]  After Breast Cancer Class It is recommended you attend the ABC class to be educated on lymphedema risk reduction. This class is free of charge and lasts for 1 hour. It is a 1-time class. You will need to download the Webex app either on your phone or computer. We will send you a link the night before or the morning of the class. You should be able to click on that link to join the class. This is not a confidential class. You don't have to turn your camera on, but other participants may be able to see your email address.  Scar massage when scabs fall off You can begin gentle scar massage to you incision sites. Gently place one hand on the incision and move the skin (without sliding on the skin) in various directions. Do this for a few minutes and then you can gently massage either coconut oil or vitamin E  cream into the scars.  Compression garment You should continue wearing your compression bra until you feel like you no longer have swelling.  Home exercise Program Continue doing the exercises you were given until you feel like you can do them without feeling any tightness at the end.   Walking  Program Studies show that 30 minutes of walking per day (fast enough to elevate your heart rate) can significantly reduce the risk of a cancer recurrence. If you can't walk due to other medical reasons, we encourage you to find another activity you could do (like a stationary bike or water exercise).  Posture After breast cancer surgery, people frequently sit with rounded shoulders posture because it puts their incisions on slack and feels better. If you sit like this and scar tissue forms in that position, you can become very tight and have pain sitting or standing with good posture. Try to be aware of your posture and sit and stand up tall to heal properly.  Follow up PT: It is recommended you return every 3 months for the first 3 years following surgery to be assessed on the SOZO machine for an L-Dex score. This helps prevent clinically significant lymphedema in 95% of patients. These follow up screens are 10 minute appointments that you are not billed for.  Claris Pong, PT 09/01/2021, 3:59 PM

## 2021-09-02 ENCOUNTER — Encounter (HOSPITAL_COMMUNITY): Payer: Self-pay

## 2021-09-07 ENCOUNTER — Ambulatory Visit: Payer: 59 | Admitting: Physical Therapy

## 2021-09-13 ENCOUNTER — Ambulatory Visit: Payer: 59 | Attending: General Surgery | Admitting: Physical Therapy

## 2021-09-13 ENCOUNTER — Encounter: Payer: Self-pay | Admitting: Physical Therapy

## 2021-09-13 DIAGNOSIS — C50412 Malignant neoplasm of upper-outer quadrant of left female breast: Secondary | ICD-10-CM | POA: Diagnosis present

## 2021-09-13 DIAGNOSIS — R293 Abnormal posture: Secondary | ICD-10-CM | POA: Insufficient documentation

## 2021-09-13 DIAGNOSIS — M25612 Stiffness of left shoulder, not elsewhere classified: Secondary | ICD-10-CM | POA: Diagnosis not present

## 2021-09-13 DIAGNOSIS — R600 Localized edema: Secondary | ICD-10-CM | POA: Diagnosis present

## 2021-09-13 DIAGNOSIS — Z170 Estrogen receptor positive status [ER+]: Secondary | ICD-10-CM | POA: Insufficient documentation

## 2021-09-13 NOTE — Therapy (Signed)
OUTPATIENT PHYSICAL THERAPY BREAST CANCER TREATMENT   Patient Name: Darlene Peters MRN: [id number] DOB:1970-06-15, 51 y.o., female Today's Date: 09/13/2021   PT End of Session - 09/13/21 0805     Visit Number 4    Number of Visits 14    Date for PT Re-Evaluation 10/06/21    PT Start Time 0804    PT Stop Time 0855    PT Time Calculation (min) 51 min    Activity Tolerance Patient tolerated treatment well    Behavior During Therapy [facility] for tasks assessed/performed             Past Medical History:  Diagnosis Date   Asthma    "worse when lying down"   Breast cancer ([place])    Family history of breast cancer 07/19/2021   Family history of prostate cancer 07/19/2021   GERD (gastroesophageal reflux disease)    Psoriasis    Urticaria    Past Surgical History:  Procedure Laterality Date   ABDOMINAL HYSTERECTOMY     partial   BREAST BIOPSY Left 05/26/2021   BREAST LUMPECTOMY WITH RADIOACTIVE SEED AND SENTINEL LYMPH NODE BIOPSY Left 08/03/2021   Procedure: LEFT BREAST LUMPECTOMY WITH RADIOACTIVE SEED AND SENTINEL LYMPH NODE BIOPSY;  Surgeon: Jovita Kussmaul, MD;  Location: [place];  Service: General;  Laterality: Left;   RADIOACTIVE SEED GUIDED AXILLARY SENTINEL LYMPH NODE Left 08/03/2021   Procedure: RADIOACTIVE SEED GUIDED LEFT AXILLARY SENTINEL LYMPH NODE DISSECTION;  Surgeon: Jovita Kussmaul, MD;  Location: [place];  Service: General;  Laterality: Left;   TUMOR REMOVAL Left 08/2021   breast   Patient Active Problem List   Diagnosis Date Noted   Genetic testing 07/27/2021   Family history of breast cancer 07/19/2021   Family history of prostate cancer 07/19/2021   Anxiety 06/22/2021   Difficulty sleeping 06/22/2021   Major depressive disorder, single episode, unspecified 06/22/2021   Malignant neoplasm of upper-outer quadrant of left breast in female, estrogen receptor positive ([place]) 06/14/2021   Other fatigue 11/05/2019   Psoriasis  10/14/2019   Gastroesophageal reflux disease without esophagitis 05/01/2019   Low back pain 01/11/2017    PCP: Suzanna Obey, MD  REFERRING PROVIDER: Jovita Kussmaul, MD  REFERRING DIAG: Left Breast Cancer  THERAPY DIAG:  Stiffness of left shoulder, not elsewhere classified  Localized edema  Abnormal posture  Malignant neoplasm of upper-outer quadrant of left breast in female, estrogen receptor positive ([place])  Rationale for Evaluation and Treatment Rehabilitation  ONSET DATE: 05/31/2021  SUBJECTIVE:  SUBJECTIVE STATEMENT:  I am still having tightness under my arm and some pain.  PERTINENT HISTORY:  Patient was diagnosed on 05/31/2021 with left Breast Cancer. It measures 2.6 cm and is located in the upper-outer quadrant. It is ER+,PR-, HER 2 - with a Ki67 of <5%.  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  PAIN:  Are you having pain? Yes: NPRS scale: 4/10 Pain location: left axilla and breast Pain description: sharp, constant Aggravating factors: reaching, strenuous activity, lifting gallon of water, laundry detergent Relieving factors: placing pillow between breasts when lying down  PRECAUTIONS: Recent Surgery, left UE Lymphedema risk, Other: back pain  ACTIVITY LEVEL / LEISURE: cleaning with right hand,bathing,   OBJECTIVE:   PATIENT SURVEYS:  QUICK DASH: 82%  OBSERVATIONS:  Scar tissue noted under axillary incision, scabs still present at both incisions so not ready for scar massage yet, generalized swelling at left breast. Axillary cording noted PALPATION: Very sensitive/tender at left  breast  POSTURE:  Forward head rounded shoulders  LYMPHEDEMA ASSESSMENT:     UPPER EXTREMITY AROM/PROM:   A/PROM RIGHT  06/20/2021    Shoulder extension 62  Shoulder flexion 150  Shoulder abduction 165  Shoulder internal rotation 75  Shoulder external rotation 105                          (Blank rows = not tested)   A/PROM LEFT  06/20/2021 LEFT  08/25/2021  Shoulder extension 38, back of arm 32 sore  Shoulder flexion 130 118 sore/pain  Shoulder abduction 145 105  Shoulder internal rotation NT   Shoulder external rotation 80 sharp pains in neck 75                          (Blank rows = not tested)     CERVICAL AROM: All within normal limits except slight limitation with right rotation with complaints of sharp pain in neck and down left arm, improved after ROM exercises;otherwise WNL         UPPER EXTREMITY STRENGTH: WNL     LYMPHEDEMA ASSESSMENTS:    LANDMARK RIGHT  06/20/2021  10 cm proximal to olecranon process 31.0  Olecranon process 25.7  10 cm proximal to ulnar styloid process 20.3  Just proximal to ulnar styloid process 15.1  Across hand at thumb web space 20.1  At base of 2nd digit 5.9  (Blank rows = not tested)   LANDMARK LEFT  06/20/2021 Left 08/25/2021  10 cm proximal to olecranon process 30.2 30.0  Olecranon process 26.1 25.1  10 cm proximal to ulnar styloid process 19.1 18.8  Just proximal to ulnar styloid process 14.7 14.6  Across hand at thumb web space 19.3 18.9  At base of 2nd digit 6.0 5.7  (Blank rows = not tested)      Surgery type/Date:08/03/2021 Left Lumpectomy with deep axillary SLNB Number of lymph nodes removed: 0/6 Current/past treatment (chemo, radiation, hormone therapy): will have radiation Other symptoms:  Heaviness/tightness Yesleft breast Pain Yes Pitting edema No Infections No Decreased scar mobility Yes Stemmer sign No     TREATMENT TODAY 09/13/21- MLD: short neck, 5 diaphragmatic breaths, right axillary nodes and  establishment of interaxillary pathway, left inguinal nodes and establishment of axillo inguinal  pathway, L breast moving fluid towards pathways then retracing all steps MFR: to cording in L axilla extending down to forearm   09/01/2021 Soft tissue mobilization to left pectorals,UT, and lats in supine and in SL to left scapular area. PROM to left shoulder flexion, scaption, ABD, ER. MFR techniques to left arm area of cording. Supine AROM shoulder flexion, scaption, horizontal abduction x5 Lower trunk rotation with arms extended Pt was educated in desensitization techniques for the axillary region  PATIENT EDUCATION:  Education details: compression bra and new script given, ABC class, SOZO screen, importance of doing stretches, no scar massage yet, reviewed post op exercises Person educated: Patient Education method: Explanation, Demonstration, Verbal cues, and Handouts Education comprehension: returned demonstration 09/13/21- basic principals of MLD  HOME EXERCISE PROGRAM:  Reviewed previously given post op HEP. Pt had not been doing clasped hands flexion or abd wall slides, but had been doing flexion wall slides. Educated on importance and gave another copy of post op exercises  ASSESSMENT:  CLINICAL IMPRESSION: Pt having tenderness and pain in the left breast today. She reports her breast is larger than the other breast. Began MLD today to L breast to help decrease fullness and discomfort. Focused on myofascial release to cording in L axilla extending to forearm. By end of session cording was much less palpable.   Pt will benefit from skilled therapeutic intervention to improve on the following deficits: Decreased knowledge of precautions, impaired UE functional use, pain, decreased ROM, postural dysfunction.   PT treatment/interventions: ADL/Self care home management, Therapeutic exercises, Therapeutic activity, Patient/Family education, Joint mobilization, Orthotic/Fit training, Aquatic  Therapy, Manual lymph drainage, scar mobilization, and Manual therapy     GOALS: Goals reviewed with patient? Yes  LONG TERM GOALS:  (STG=LTG)  GOALS Name Target Date  Goal status  1 Pt will demonstrate she has regained full shoulder ROM and function post operatively compared to baselines.  Baseline: 10/25/2021 IN PROGRESS  2 Left axillary cording will be resolved 10/06/2021 INITIAL  3 Pt will improve quick dash to no greater than 20% 10/06/2021 INITIAL  4 Pt will have arm/shoulder pain no greater than 3/10 10/06/2021 INITIAL  5 Pt will report a 50% improvement in pain in L breast to allow improved comfort. 10/06/21 NEW     PLAN: PT FREQUENCY/DURATION: 2x/week for 6 weeks  PLAN FOR NEXT SESSION: STM to left upper quarter, MFR to upper arm cord, review HEP, AA/PROM, pics of LTR.  Continue MLD to L breast and instruct pt,Progress to strength as tolerated.   [facility], PT 09/13/2021, 9:04 AM

## 2021-09-14 ENCOUNTER — Encounter: Payer: Self-pay | Admitting: Hematology and Oncology

## 2021-09-14 ENCOUNTER — Other Ambulatory Visit: Payer: Self-pay | Admitting: *Deleted

## 2021-09-14 ENCOUNTER — Telehealth: Payer: Self-pay | Admitting: *Deleted

## 2021-09-14 ENCOUNTER — Other Ambulatory Visit: Payer: Self-pay

## 2021-09-14 ENCOUNTER — Inpatient Hospital Stay: Payer: 59 | Attending: Hematology and Oncology

## 2021-09-14 ENCOUNTER — Inpatient Hospital Stay (HOSPITAL_BASED_OUTPATIENT_CLINIC_OR_DEPARTMENT_OTHER): Payer: 59 | Admitting: Hematology and Oncology

## 2021-09-14 VITALS — BP 140/91 | HR 82 | Temp 98.8°F | Resp 16 | Ht 68.0 in | Wt 200.5 lb

## 2021-09-14 DIAGNOSIS — Z170 Estrogen receptor positive status [ER+]: Secondary | ICD-10-CM

## 2021-09-14 DIAGNOSIS — F32A Depression, unspecified: Secondary | ICD-10-CM | POA: Diagnosis not present

## 2021-09-14 DIAGNOSIS — Z79899 Other long term (current) drug therapy: Secondary | ICD-10-CM | POA: Diagnosis not present

## 2021-09-14 DIAGNOSIS — Z5111 Encounter for antineoplastic chemotherapy: Secondary | ICD-10-CM | POA: Diagnosis present

## 2021-09-14 DIAGNOSIS — C50412 Malignant neoplasm of upper-outer quadrant of left female breast: Secondary | ICD-10-CM

## 2021-09-14 DIAGNOSIS — Z803 Family history of malignant neoplasm of breast: Secondary | ICD-10-CM | POA: Insufficient documentation

## 2021-09-14 DIAGNOSIS — Z800 Family history of malignant neoplasm of digestive organs: Secondary | ICD-10-CM | POA: Insufficient documentation

## 2021-09-14 DIAGNOSIS — Z5189 Encounter for other specified aftercare: Secondary | ICD-10-CM | POA: Insufficient documentation

## 2021-09-14 MED ORDER — LIDOCAINE-PRILOCAINE 2.5-2.5 % EX CREA: TOPICAL_CREAM | CUTANEOUS | 3 refills | Status: DC

## 2021-09-14 MED ORDER — DEXAMETHASONE 4 MG PO TABS: 8.0000 mg | ORAL_TABLET | Freq: Two times a day (BID) | ORAL | 1 refills | Status: DC

## 2021-09-14 MED ORDER — ONDANSETRON HCL 8 MG PO TABS: 8.0000 mg | ORAL_TABLET | Freq: Two times a day (BID) | ORAL | 1 refills | Status: DC | PRN

## 2021-09-14 MED ORDER — PROCHLORPERAZINE MALEATE 10 MG PO TABS: 10.0000 mg | ORAL_TABLET | Freq: Four times a day (QID) | ORAL | 1 refills | Status: DC | PRN

## 2021-09-14 NOTE — Assessment & Plan Note (Addendum)
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 She is now postsurgery and is here to discuss Oncotype results and role of chemotherapy.  We have discussed final pathology as well as Oncotype score which resulted at 28, distant recurrence risk at 9 years of 17%, absolute benefit of chemotherapy greater than 15%. Given young age, excellent PS, Oncotype score 28, we have discussed about adjuvant chemotherapy with TC every 21 days for 4 cycles.  We have discussed about adverse effects of chemotherapy including but not limited to fatigue, nausea, alopecia vomiting, increased risk of infections, neuropathy etc.  She understands that some of the side effects can be permanent.   Anticipate C1D1 of chemotherapy on 7/26, labs and MD visit same day RTC as mentioned above

## 2021-09-14 NOTE — Progress Notes (Signed)
Dr. Gilberto Better is [facility] [place] CONSULT NOTE  Patient Care Team: [name], MD as PCP - General (Obstetrics and Gynecology) Rockwell Germany, RN as Oncology Nurse Navigator Tressie Ellis, Paulette Blanch, RN as Oncology Nurse Navigator Benay Pike, MD as Consulting Physician (Hematology and Oncology) Jovita Kussmaul, MD as Consulting Physician (General Surgery) Eppie Gibson, MD as Attending Physician (Radiation Oncology)  CHIEF COMPLAINTS/PURPOSE OF CONSULTATION:  Newly diagnosed breast cancer  HISTORY OF PRESENTING ILLNESS:  Darlene Peters 51 y.o. female is here because of recent diagnosis of left breast cancer  I reviewed her records extensively and collaborated the history with the patient.  SUMMARY OF ONCOLOGIC HISTORY: Oncology History  Malignant neoplasm of upper-outer quadrant of left breast in female, estrogen receptor positive ([place])  06/14/2021 Initial Diagnosis   Malignant neoplasm of upper-outer quadrant of left breast in female, estrogen receptor positive ([place])   07/26/2021 Genetic Testing   Negative hereditary cancer genetic testing: no pathogenic variants detected in [facility] BRCAPlus Panel and CancerNext +RNAinsight Panel.  Report dates are Jul 26, 2021 and August 08, 2021.   The BRCAplus panel offered by [employer] and includes sequencing and deletion/duplication analysis for the following 8 genes: ATM, BRCA1, BRCA2, CDH1, CHEK2, PALB2, PTEN, and TP53.  The CancerNext gene panel offered by [employer] includes sequencing, rearrangement analysis, and RNA analysis for the following 36 genes:   APC, ATM, AXIN2, BARD1, BMPR1A, BRCA1, BRCA2, BRIP1, CDH1, CDK4, CDKN2A, CHEK2, DICER1, HOXB13, EPCAM, GREM1, MLH1, MSH2, MSH3, MSH6, MUTYH, NBN, NF1, NTHL1, PALB2, PMS2, POLD1, POLE, PTEN, RAD51C, RAD51D, RECQL, SMAD4, SMARCA4, STK11, and TP53.    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 Oncotype score of 28, distant recurrence risk at 9 years of 17%, absolute benefit of chemotherapy greater than 15% on Oncotype she tested positive for ER and PR and HER2 negative   09/29/2021 -  Chemotherapy   Patient is on Treatment Plan : BREAST TC q21d     She works at [employer]. Husband is estranged. She has 4 kids, one daughter and three son. Daughters are 66, sons are 69, 38 and 40. She has 4 grandchildren. Oldest son lives here.    Interval History  She is here for a follow up since she had tremendous pain during her last visit with [place]. She continues to have stinging pain in the left breast and pain radiating down the arm, some pain above the left shoulder blade. She is taking 600 mg of ibuprofen a day. Wellbutrin is working well for the anxiety. No other complaints. Rest of the pertinent 10 point ROS reviewed and negative.  MEDICAL HISTORY:  Past Medical History:  Diagnosis Date   Asthma    "worse when lying down"   Breast cancer ([place])    Family history of breast cancer 07/19/2021   Family history of prostate cancer 07/19/2021   GERD (gastroesophageal reflux disease)    Psoriasis    Urticaria     SURGICAL HISTORY: Past Surgical History:  Procedure Laterality Date   ABDOMINAL HYSTERECTOMY     partial   BREAST BIOPSY Left 05/26/2021   BREAST LUMPECTOMY WITH RADIOACTIVE SEED AND SENTINEL LYMPH NODE BIOPSY Left 08/03/2021   Procedure: LEFT BREAST LUMPECTOMY WITH RADIOACTIVE SEED AND SENTINEL LYMPH NODE BIOPSY;  Surgeon: Marlou Starks,  Sena Hitch, MD;  Location: [facility];  Service: General;  Laterality: Left;   RADIOACTIVE SEED GUIDED AXILLARY SENTINEL LYMPH NODE Left 08/03/2021   Procedure: RADIOACTIVE SEED GUIDED LEFT AXILLARY SENTINEL LYMPH NODE DISSECTION;  Surgeon: Jovita Kussmaul, MD;  Location: [facility];  Service: General;  Laterality: Left;   TUMOR REMOVAL Left 08/2021   breast    SOCIAL HISTORY: Social History   Socioeconomic History   Marital status: Married    Spouse name: Not on file   Number of children: Not on file   Years of education: Not on file   Highest education level: Not on file  Occupational History   Not on file  Tobacco Use   Smoking status: Never    Passive exposure: Never   Smokeless tobacco: Never  Vaping Use   Vaping Use: Never used  Substance and Sexual Activity   Alcohol use: No   Drug use: No   Sexual activity: Yes    Birth control/protection: Surgical  Other Topics Concern   Not on file  Social History Narrative   Not on file   Social Determinants of Health   Financial Resource Strain: High Risk (07/14/2021)   Overall Financial Resource Strain (CARDIA)    Difficulty of Paying Living Expenses: Hard  Food Insecurity: Not on file  Transportation Needs: No Transportation Needs (07/14/2021)   PRAPARE - Hydrologist (Medical): No    Lack of Transportation (Non-Medical): No  Physical Activity: Not on file  Stress: Not on file  Social Connections: Not on file  Intimate Partner Violence: Not on file    FAMILY HISTORY: Family History  Problem Relation Age of Onset   Allergic rhinitis Mother    Allergic rhinitis Sister    Allergic rhinitis Sister    Breast cancer Maternal Aunt 45   Prostate cancer Maternal Uncle        mets; dx after 50   Prostate cancer Paternal Uncle    Leukemia Paternal Uncle    Prostate cancer Paternal Uncle 66   Gastric cancer Paternal Uncle    Skin cancer Maternal Grandmother 25   Breast cancer Cousin 21       maternal cousin   Cervical cancer Cousin    Breast cancer Cousin        dx 76s; maternal female cousin   Breast cancer Cousin        paternal female cousin x2    ALLERGIES:  is allergic to shellfish allergy, gluten meal, and latex.  MEDICATIONS:  Current Outpatient  Medications  Medication Sig Dispense Refill   albuterol (PROVENTIL HFA) 108 (90 Base) MCG/ACT inhaler Inhale 2 puffs into the lungs every 4 (four) hours as needed for wheezing or shortness of breath. 8.5 g 2   buPROPion (WELLBUTRIN XL) 150 MG 24 hr tablet Take 1 tablet ($RemoveB'150mg'NKOZCXJr$ ) by mouth once daily. 30 tablet 0   buPROPion (WELLBUTRIN XL) 300 MG 24 hr tablet Take one tablet ($RemoveBef'300mg'GBbtTJbsXl$ ) by mouth daily. 30 tablet 0   clobetasol (TEMOVATE) 0.05 % external solution Apply topically to affected areas twice daily as needed (not to face,groin,or axilla) 50 mL 3   desonide (DESOWEN) 0.05 % lotion Apply topically to affected areas twice daily as needed (not to face,groin,or axilla) 60 mL 3   dexamethasone (DECADRON) 4 MG tablet Take 2 tablets (8 mg total) by mouth 2 (two) times daily. Start the day before Taxotere. Then again the day after chemo for  3 days. 30 tablet 1   fluticasone-salmeterol (ADVAIR DISKUS) 250-50 MCG/ACT AEPB Inhale 1 puff into the lungs in the morning and at bedtime. 60 each 5   gabapentin (NEURONTIN) 100 MG capsule Take 1 capsule (100 mg total) by mouth 3 (three) times daily. 90 capsule 2   levocetirizine (XYZAL) 5 MG tablet Take 1 tablet (5 mg total) by mouth every evening. 30 tablet 5   lidocaine-prilocaine (EMLA) cream Apply to affected area once 30 g 3   ondansetron (ZOFRAN) 8 MG tablet Take 1 tablet (8 mg total) by mouth 2 (two) times daily as needed for refractory nausea / vomiting. Start on day 3 after chemo. 30 tablet 1   prochlorperazine (COMPAZINE) 10 MG tablet Take 1 tablet (10 mg total) by mouth every 6 (six) hours as needed (Nausea or vomiting). 30 tablet 1   triamcinolone cream (KENALOG) 0.1 % Apply topically to affected areas twice daily as needed (not to face,groin,or axilla) 453.6 g 3   No current facility-administered medications for this visit.    REVIEW OF SYSTEMS:   Constitutional: Denies fevers, chills or abnormal night sweats Eyes: Denies blurriness of vision, double  vision or watery eyes Ears, nose, mouth, throat, and face: Denies mucositis or sore throat Respiratory: Denies cough, dyspnea or wheezes Cardiovascular: Denies palpitation, chest discomfort or lower extremity swelling Gastrointestinal:  Denies nausea, heartburn or change in bowel habits Skin: Denies abnormal skin rashes Lymphatics: Denies new lymphadenopathy or easy bruising Neurological:Denies numbness, tingling or new weaknesses Behavioral/Psych: Mood is stable, no new changes  Breast: Noticed the dent in the breast along with some nipple retraction in the left breast All other systems were reviewed with the patient and are negative.  PHYSICAL EXAMINATION: ECOG PERFORMANCE STATUS: 0 - Asymptomatic  Vitals:   09/14/21 0851  BP: (!) 140/91  Pulse: 82  Resp: 16  Temp: 98.8 F (37.1 C)  SpO2: 100%   Filed Weights   09/14/21 0851  Weight: 200 lb 8 oz (90.9 kg)    Physical Exam Constitutional:      Appearance: Normal appearance.  Chest:     Comments: Left breast and left axilla appears to be healing well. Neurological:     Mental Status: She is alert.      LABORATORY DATA:  I have reviewed the data as listed Lab Results  Component Value Date   WBC 6.3 08/09/2021   HGB 14.0 08/09/2021   HCT 40.9 08/09/2021   MCV 91 08/09/2021   PLT 159 04/28/2018   Lab Results  Component Value Date   NA 140 08/09/2021   K 3.9 08/09/2021   CL 105 08/09/2021   CO2 23 08/09/2021    RADIOGRAPHIC STUDIES: I have personally reviewed the radiological reports and agreed with the findings in the report.  ASSESSMENT AND PLAN:  Malignant neoplasm of upper-outer quadrant of left breast in female, estrogen receptor positive ([place]) This is a 51 year old female patient with newly diagnosed left breast invasive ductal carcinoma, grade 2, ER 15% moderate staining, PR negative, HER2 negative, KI of 2% referred to medical oncology for adjuvant recommendations. She is now postsurgery and is here  to discuss Oncotype results and role of chemotherapy.  We have discussed final pathology as well as Oncotype score which resulted at 28, distant recurrence risk at 9 years of 17%, absolute benefit of chemotherapy greater than 15%. Given young age, excellent PS, Oncotype score 28, we have discussed about adjuvant chemotherapy with TC every 21 days for 4 cycles.  We have discussed about adverse effects of chemotherapy including but not limited to fatigue, nausea, alopecia vomiting, increased risk of infections, neuropathy etc.  She understands that some of the side effects can be permanent.   Anticipate C1D1 of chemotherapy on 7/26, labs and MD visit same day RTC as mentioned above    Total time spent: 20 minutes. All questions were answered. The patient knows to call the clinic with any problems, questions or concerns.    Benay Pike, MD 09/14/21

## 2021-09-14 NOTE — Telephone Encounter (Signed)
Pt informed chemo education nurse that she would like to have a port for chemo administration. Per Dr. Marlou Starks, IR may place port

## 2021-09-15 ENCOUNTER — Ambulatory Visit: Payer: 59 | Admitting: Physical Therapy

## 2021-09-15 ENCOUNTER — Encounter: Payer: Self-pay | Admitting: Physical Therapy

## 2021-09-15 DIAGNOSIS — M25612 Stiffness of left shoulder, not elsewhere classified: Secondary | ICD-10-CM | POA: Diagnosis not present

## 2021-09-15 DIAGNOSIS — R600 Localized edema: Secondary | ICD-10-CM

## 2021-09-15 DIAGNOSIS — Z170 Estrogen receptor positive status [ER+]: Secondary | ICD-10-CM

## 2021-09-15 DIAGNOSIS — R293 Abnormal posture: Secondary | ICD-10-CM

## 2021-09-15 NOTE — Patient Instructions (Signed)
Self manual lymph drainage: Perform this sequence once a day.  Only give enough pressure no your skin to make the skin move.  Diaphragmatic - Supine   Inhale through nose making navel move out toward hands. Exhale through puckered lips, hands follow navel in. Repeat _5__ times. Rest _10__ seconds between repeats.   Copyright  VHI. All rights reserved.  Hug yourself.  Do circles at your neck just above your collarbones.  Repeat this 10 times.  Axilla - One at a Time   Using full weight of flat hand and fingers at center of uninvolved armpit, make _10__ in-place circles.   Copyright  VHI. All rights reserved.  LEG: Inguinal Nodes Stimulation   With small finger side of hand against hip crease on involved side, gently perform circles at the crease. Repeat __10_ times.   Copyright  VHI. All rights reserved.  Axilla to Inguinal Nodes - Sweep   On involved side, sweep _4__ times from armpit along side of trunk to hip crease.  Now gently stretch skin from the involved side to the uninvolved side across the chest at the shoulder line.  Repeat that 4 times.  Draw an imaginary diagonal line from upper outer breast through the nipple area toward lower inner breast.  Direct fluid upward and inward from this line toward the pathway across your upper chest .  Do this in three rows to treat all of the upper inner breast tissue, and do each row 3-4x.      Direct fluid to treat all of lower outer breast tissue downward and outward toward pathway that is aimed at the left groin.  Finish by doing the pathways as described above going from your involved armpit to the same side groin and going across your upper chest from the involved shoulder to the uninvolved shoulder.  Repeat the steps above where you do circles in your left groin and right armpit. Copyright  VHI. All rights reserved.   

## 2021-09-15 NOTE — Therapy (Signed)
OUTPATIENT PHYSICAL THERAPY BREAST CANCER TREATMENT   Patient Name: Darlene Peters MRN: [id number] DOB:Sep 10, 1970, 51 y.o., female Today's Date: 09/15/2021   PT End of Session - 09/15/21 0806     Visit Number 5    Number of Visits 14    Date for PT Re-Evaluation 10/06/21    PT Start Time 0805    PT Stop Time 0857    PT Time Calculation (min) 52 min    Activity Tolerance Patient tolerated treatment well    Behavior During Therapy [facility] for tasks assessed/performed             Past Medical History:  Diagnosis Date   Asthma    "worse when lying down"   Breast cancer ([place])    Family history of breast cancer 07/19/2021   Family history of prostate cancer 07/19/2021   GERD (gastroesophageal reflux disease)    Psoriasis    Urticaria    Past Surgical History:  Procedure Laterality Date   ABDOMINAL HYSTERECTOMY     partial   BREAST BIOPSY Left 05/26/2021   BREAST LUMPECTOMY WITH RADIOACTIVE SEED AND SENTINEL LYMPH NODE BIOPSY Left 08/03/2021   Procedure: LEFT BREAST LUMPECTOMY WITH RADIOACTIVE SEED AND SENTINEL LYMPH NODE BIOPSY;  Surgeon: Jovita Kussmaul, MD;  Location: [facility];  Service: General;  Laterality: Left;   RADIOACTIVE SEED GUIDED AXILLARY SENTINEL LYMPH NODE Left 08/03/2021   Procedure: RADIOACTIVE SEED GUIDED LEFT AXILLARY SENTINEL LYMPH NODE DISSECTION;  Surgeon: Jovita Kussmaul, MD;  Location: [facility];  Service: General;  Laterality: Left;   TUMOR REMOVAL Left 08/2021   breast   Patient Active Problem List   Diagnosis Date Noted   Genetic testing 07/27/2021   Family history of breast cancer 07/19/2021   Family history of prostate cancer 07/19/2021   Anxiety 06/22/2021   Difficulty sleeping 06/22/2021   Major depressive disorder, single episode, unspecified 06/22/2021   Malignant neoplasm of upper-outer quadrant of left breast in female, estrogen receptor positive ([place]) 06/14/2021   Other fatigue 11/05/2019   Psoriasis  10/14/2019   Gastroesophageal reflux disease without esophagitis 05/01/2019   Low back pain 01/11/2017    PCP: Suzanna Obey, MD  REFERRING PROVIDER: Jovita Kussmaul, MD  REFERRING DIAG: Left Breast Cancer  THERAPY DIAG:  Stiffness of left shoulder, not elsewhere classified  Localized edema  Abnormal posture  Malignant neoplasm of upper-outer quadrant of left breast in female, estrogen receptor positive ([place])  Rationale for Evaluation and Treatment Rehabilitation  ONSET DATE: 05/31/2021  SUBJECTIVE:  SUBJECTIVE STATEMENT:  I am still having pain. I have pain going from my armpit to the scar and it hurts when I try to lift my arm all the way up.   PERTINENT HISTORY:  Patient was diagnosed on 05/31/2021 with left Breast Cancer. It measures 2.6 cm and is located in the upper-outer quadrant. It is ER+,PR-, HER 2 - with a Ki67 of <5%.  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  PAIN:  Are you having pain? Yes: NPRS scale: 5/10 Pain location: left axilla Pain description: sharp, constant Aggravating factors: reaching, strenuous activity, lifting gallon of water, laundry detergent Relieving factors: placing pillow between breasts when lying down  PRECAUTIONS: Recent Surgery, left UE Lymphedema risk, Other: back pain  ACTIVITY LEVEL / LEISURE: cleaning with right hand,bathing,   OBJECTIVE:   PATIENT SURVEYS:  QUICK DASH: 82%  OBSERVATIONS:  Scar tissue noted under axillary incision, scabs still present at both incisions so not ready for scar massage yet, generalized swelling at left breast. Axillary cording  noted PALPATION: Very sensitive/tender at left breast  POSTURE:  Forward head rounded shoulders  LYMPHEDEMA ASSESSMENT:     UPPER EXTREMITY AROM/PROM:   A/PROM RIGHT  06/20/2021    Shoulder extension 62  Shoulder flexion 150  Shoulder abduction 165  Shoulder internal rotation 75  Shoulder external rotation 105                          (Blank rows = not tested)   A/PROM LEFT  06/20/2021 LEFT  08/25/2021  Shoulder extension 38, back of arm 32 sore  Shoulder flexion 130 118 sore/pain  Shoulder abduction 145 105  Shoulder internal rotation NT   Shoulder external rotation 80 sharp pains in neck 75                          (Blank rows = not tested)     CERVICAL AROM: All within normal limits except slight limitation with right rotation with complaints of sharp pain in neck and down left arm, improved after ROM exercises;otherwise WNL         UPPER EXTREMITY STRENGTH: WNL     LYMPHEDEMA ASSESSMENTS:    LANDMARK RIGHT  06/20/2021  10 cm proximal to olecranon process 31.0  Olecranon process 25.7  10 cm proximal to ulnar styloid process 20.3  Just proximal to ulnar styloid process 15.1  Across hand at thumb web space 20.1  At base of 2nd digit 5.9  (Blank rows = not tested)   LANDMARK LEFT  06/20/2021 Left 08/25/2021  10 cm proximal to olecranon process 30.2 30.0  Olecranon process 26.1 25.1  10 cm proximal to ulnar styloid process 19.1 18.8  Just proximal to ulnar styloid process 14.7 14.6  Across hand at thumb web space 19.3 18.9  At base of 2nd digit 6.0 5.7  (Blank rows = not tested)      Surgery type/Date:08/03/2021 Left Lumpectomy with deep axillary SLNB Number of lymph nodes removed: 0/6 Current/past treatment (chemo, radiation, hormone therapy): will have radiation Other symptoms:  Heaviness/tightness Yesleft breast Pain Yes Pitting edema No Infections No Decreased scar mobility Yes Stemmer sign No     TREATMENT TODAY 09/15/21- MFR: to cording in L  axilla extending down to forearm with numerous cords palpable, cording in axilla very thick but by end of session was much improved   PROM: to L shoulder in to abduction and flexion  09/13/21- MLD: short neck, 5 diaphragmatic breaths, right axillary nodes and establishment of interaxillary pathway, left inguinal nodes and establishment of axillo inguinal pathway, L breast moving fluid towards pathways then retracing all steps MFR: to cording in L axilla extending down to forearm   09/01/2021 Soft tissue mobilization to left pectorals,UT, and lats in supine and in SL to left scapular area. PROM to left shoulder flexion, scaption, ABD, ER. MFR techniques to left arm area of cording. Supine AROM shoulder flexion, scaption, horizontal abduction x5 Lower trunk rotation with arms extended Pt was educated in desensitization techniques for the axillary region  PATIENT EDUCATION:  Education details: compression bra and new script given, ABC class, SOZO screen, importance of doing stretches, no scar massage yet, reviewed post op exercises Person educated: Patient Education method: Explanation, Demonstration, Verbal cues, and Handouts Education comprehension: returned demonstration 09/13/21- basic principals of MLD  HOME EXERCISE PROGRAM:  Reviewed previously given post op HEP. Pt had not been doing clasped hands flexion or abd wall slides, but had been doing flexion wall slides. Educated on importance and gave another copy of post op exercises  ASSESSMENT:  CLINICAL IMPRESSION: Pt continuing to have discomfort and pain in her L axilla secondary to cording. She has numerous cords in this area. She has been trying to stretch at home. At beginning of session she was very sensitive in area of cording but this improved by end of session.  Pt will benefit from skilled therapeutic intervention to improve on the following deficits: Decreased knowledge of precautions, impaired UE functional use, pain,  decreased ROM, postural dysfunction.   PT treatment/interventions: ADL/Self care home management, Therapeutic exercises, Therapeutic activity, Patient/Family education, Joint mobilization, Orthotic/Fit training, Aquatic Therapy, Manual lymph drainage, scar mobilization, and Manual therapy     GOALS: Goals reviewed with patient? Yes  LONG TERM GOALS:  (STG=LTG)  GOALS Name Target Date  Goal status  1 Pt will demonstrate she has regained full shoulder ROM and function post operatively compared to baselines.  Baseline: 10/27/2021 IN PROGRESS  2 Left axillary cording will be resolved 10/06/2021 INITIAL  3 Pt will improve quick dash to no greater than 20% 10/06/2021 INITIAL  4 Pt will have arm/shoulder pain no greater than 3/10 10/06/2021 INITIAL  5 Pt will report a 50% improvement in pain in L breast to allow improved comfort. 10/06/21 NEW     PLAN: PT FREQUENCY/DURATION: 2x/week for 6 weeks  PLAN FOR NEXT SESSION: STM to left upper quarter, MFR to upper arm cord, review HEP, AA/PROM, pics of LTR.  Continue MLD to L breast and instruct pt,Progress to strength as tolerated.   Ms [facility], PT 09/15/2021, 8:57 AM

## 2021-09-16 ENCOUNTER — Encounter: Payer: Self-pay | Admitting: Hematology and Oncology

## 2021-09-16 ENCOUNTER — Other Ambulatory Visit: Payer: Self-pay

## 2021-09-16 ENCOUNTER — Telehealth: Payer: Self-pay | Admitting: Licensed Clinical Social Worker

## 2021-09-16 MED ORDER — BUPROPION HCL ER (XL) 300 MG PO TB24
ORAL_TABLET | ORAL | 0 refills | Status: DC
Start: 2021-09-16 — End: 2021-11-08
  Filled 2021-09-16 – 2021-09-29 (×3): qty 30, 30d supply, fill #0

## 2021-09-16 MED ORDER — BUPROPION HCL ER (XL) 150 MG PO TB24
ORAL_TABLET | ORAL | 0 refills | Status: DC
  Filled 2021-09-16 – 2021-09-20 (×2): qty 30, 30d supply, fill #0

## 2021-09-16 NOTE — Progress Notes (Signed)
Received referral from social worker regarding [employer].  Called patient and left voicemail with my contact name and number.

## 2021-09-16 NOTE — Progress Notes (Signed)
Patient returned my call.  Introduced myself as Arboriculturist and to offer available resources. Discussed one-time $1000 Radio broadcast assistant to assist with personal expenses while going through treatment. Advised what is needed to apply. She states she will send via email and I provided my email address. She will receive [facility] paperwork to complete on 7/26 at next visit.  She states she has concerns with transportation back and forth. Advised with [place] she can utilize gas cards which would deduct from [place] amount each time or utilize the free transportation program and use [place] funds for other needs. She states she would prefer to use the transportation program. Message sent to [employer] to refer to transportation program.  She has my contact name and number for any additional financial questions or concerns.

## 2021-09-16 NOTE — Telephone Encounter (Signed)
[place] Work  Received VM from pt asking about transportation for her chemo appts. CSW attempted to call pt back. No answer. Left VM.  Referral made to transportation coordinator and to financial advocate for [employer].   Myron Lona E Chadrick Sprinkle, LCSW

## 2021-09-19 ENCOUNTER — Other Ambulatory Visit: Payer: Self-pay

## 2021-09-19 ENCOUNTER — Other Ambulatory Visit (HOSPITAL_BASED_OUTPATIENT_CLINIC_OR_DEPARTMENT_OTHER): Payer: Self-pay

## 2021-09-19 ENCOUNTER — Encounter: Payer: Self-pay | Admitting: Hematology and Oncology

## 2021-09-19 DIAGNOSIS — C50412 Malignant neoplasm of upper-outer quadrant of left female breast: Secondary | ICD-10-CM

## 2021-09-19 MED ORDER — PROCHLORPERAZINE MALEATE 10 MG PO TABS
10.0000 mg | ORAL_TABLET | Freq: Four times a day (QID) | ORAL | 1 refills | Status: AC | PRN
  Filled 2021-09-19: qty 30, 8d supply, fill #0

## 2021-09-19 MED ORDER — ONDANSETRON HCL 8 MG PO TABS
8.0000 mg | ORAL_TABLET | Freq: Two times a day (BID) | ORAL | 1 refills | Status: DC | PRN
  Filled 2021-09-19: qty 30, 15d supply, fill #0

## 2021-09-19 MED ORDER — DEXAMETHASONE 4 MG PO TABS
8.0000 mg | ORAL_TABLET | Freq: Two times a day (BID) | ORAL | 1 refills | Status: DC
  Filled 2021-09-19: qty 30, 8d supply, fill #0

## 2021-09-20 ENCOUNTER — Other Ambulatory Visit (HOSPITAL_BASED_OUTPATIENT_CLINIC_OR_DEPARTMENT_OTHER): Payer: Self-pay

## 2021-09-20 ENCOUNTER — Encounter: Payer: Self-pay | Admitting: Physical Therapy

## 2021-09-20 ENCOUNTER — Other Ambulatory Visit: Payer: Self-pay

## 2021-09-20 ENCOUNTER — Ambulatory Visit: Payer: 59 | Admitting: Physical Therapy

## 2021-09-20 DIAGNOSIS — M25612 Stiffness of left shoulder, not elsewhere classified: Secondary | ICD-10-CM | POA: Diagnosis not present

## 2021-09-20 DIAGNOSIS — Z170 Estrogen receptor positive status [ER+]: Secondary | ICD-10-CM

## 2021-09-20 DIAGNOSIS — R293 Abnormal posture: Secondary | ICD-10-CM

## 2021-09-20 DIAGNOSIS — R600 Localized edema: Secondary | ICD-10-CM

## 2021-09-20 NOTE — Therapy (Signed)
OUTPATIENT PHYSICAL THERAPY BREAST CANCER TREATMENT   Patient Name: Darlene Peters MRN: [id number] DOB:08-Sep-1970, 51 y.o., female Today's Date: 09/20/2021   PT End of Session - 09/20/21 0805     Visit Number 6    Number of Visits 14    Date for PT Re-Evaluation 10/06/21    PT Start Time 0805    PT Stop Time 0855    PT Time Calculation (min) 50 min    Activity Tolerance Patient tolerated treatment well    Behavior During Therapy [facility] for tasks assessed/performed             Past Medical History:  Diagnosis Date   Asthma    "worse when lying down"   Breast cancer ([place])    Family history of breast cancer 07/19/2021   Family history of prostate cancer 07/19/2021   GERD (gastroesophageal reflux disease)    Psoriasis    Urticaria    Past Surgical History:  Procedure Laterality Date   ABDOMINAL HYSTERECTOMY     partial   BREAST BIOPSY Left 05/26/2021   BREAST LUMPECTOMY WITH RADIOACTIVE SEED AND SENTINEL LYMPH NODE BIOPSY Left 08/03/2021   Procedure: LEFT BREAST LUMPECTOMY WITH RADIOACTIVE SEED AND SENTINEL LYMPH NODE BIOPSY;  Surgeon: Jovita Kussmaul, MD;  Location: [place];  Service: General;  Laterality: Left;   RADIOACTIVE SEED GUIDED AXILLARY SENTINEL LYMPH NODE Left 08/03/2021   Procedure: RADIOACTIVE SEED GUIDED LEFT AXILLARY SENTINEL LYMPH NODE DISSECTION;  Surgeon: Jovita Kussmaul, MD;  Location: [place];  Service: General;  Laterality: Left;   TUMOR REMOVAL Left 08/2021   breast   Patient Active Problem List   Diagnosis Date Noted   Genetic testing 07/27/2021   Family history of breast cancer 07/19/2021   Family history of prostate cancer 07/19/2021   Anxiety 06/22/2021   Difficulty sleeping 06/22/2021   Major depressive disorder, single episode, unspecified 06/22/2021   Malignant neoplasm of upper-outer quadrant of left breast in female, estrogen receptor positive ([place]) 06/14/2021   Other fatigue 11/05/2019   Psoriasis  10/14/2019   Gastroesophageal reflux disease without esophagitis 05/01/2019   Low back pain 01/11/2017    PCP: Suzanna Obey, MD  REFERRING PROVIDER: Jovita Kussmaul, MD  REFERRING DIAG: Left Breast Cancer  THERAPY DIAG:  Stiffness of left shoulder, not elsewhere classified  Localized edema  Abnormal posture  Malignant neoplasm of upper-outer quadrant of left breast in female, estrogen receptor positive ([place])  Rationale for Evaluation and Treatment Rehabilitation  ONSET DATE: 05/31/2021  SUBJECTIVE:  SUBJECTIVE STATEMENT:  The tightness is getting better but I am still having some swelling.   PERTINENT HISTORY:  Patient was diagnosed on 05/31/2021 with left Breast Cancer. It measures 2.6 cm and is located in the upper-outer quadrant. It is ER+,PR-, HER 2 - with a Ki67 of <5%.  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  PAIN:  Are you having pain? Yes: NPRS scale: 4/10 Pain location: left breast Pain description: constant Aggravating factors: nothing Relieving factors: nothing  PRECAUTIONS: Recent Surgery, left UE Lymphedema risk, Other: back pain  ACTIVITY LEVEL / LEISURE: cleaning with right hand,bathing,   OBJECTIVE:   PATIENT SURVEYS:  QUICK DASH: 82%  OBSERVATIONS:  Scar tissue noted under axillary incision, scabs still present at both incisions so not ready for scar massage yet, generalized swelling at left breast. Axillary cording noted PALPATION: Very sensitive/tender at left breast  POSTURE:  Forward head rounded shoulders  LYMPHEDEMA ASSESSMENT:     UPPER EXTREMITY AROM/PROM:    A/PROM RIGHT  06/20/2021    Shoulder extension 62  Shoulder flexion 150  Shoulder abduction 165  Shoulder internal rotation 75  Shoulder external rotation 105                          (Blank rows = not tested)   A/PROM LEFT  06/20/2021 LEFT  08/25/2021 L 09/20/21  Shoulder extension 38, back of arm 32 sore 56  Shoulder flexion 130 118 sore/pain 160  Shoulder abduction 145 105 161  Shoulder internal rotation NT  55  Shoulder external rotation 80 sharp pains in neck 75 85                          (Blank rows = not tested)     CERVICAL AROM: All within normal limits except slight limitation with right rotation with complaints of sharp pain in neck and down left arm, improved after ROM exercises;otherwise WNL         UPPER EXTREMITY STRENGTH: WNL     LYMPHEDEMA ASSESSMENTS:    LANDMARK RIGHT  06/20/2021  10 cm proximal to olecranon process 31.0  Olecranon process 25.7  10 cm proximal to ulnar styloid process 20.3  Just proximal to ulnar styloid process 15.1  Across hand at thumb web space 20.1  At base of 2nd digit 5.9  (Blank rows = not tested)   LANDMARK LEFT  06/20/2021 Left 08/25/2021  10 cm proximal to olecranon process 30.2 30.0  Olecranon process 26.1 25.1  10 cm proximal to ulnar styloid process 19.1 18.8  Just proximal to ulnar styloid process 14.7 14.6  Across hand at thumb web space 19.3 18.9  At base of 2nd digit 6.0 5.7  (Blank rows = not tested)      Surgery type/Date:08/03/2021 Left Lumpectomy with deep axillary SLNB Number of lymph nodes removed: 0/6 Current/past treatment (chemo, radiation, hormone therapy): will have radiation Other symptoms:  Heaviness/tightness Yesleft breast Pain Yes Pitting edema No Infections No Decreased scar mobility Yes Stemmer sign No     TREATMENT TODAY  09/20/21- MLD: short neck, 5 diaphragmatic breaths, right axillary nodes and establishment of interaxillary pathway, left inguinal nodes and establishment of axillo  inguinal pathway, L breast moving fluid towards pathways  then retracing all steps. Instructed pt throughout and had pt return demonstrate all steps while providing verbal and tactile cues for correct technique.   09/15/21- MFR: to cording in L axilla extending down to forearm with numerous cords palpable, cording in axilla very thick but by end of session was much improved   PROM: to L shoulder in to abduction and flexion  09/13/21- MLD: short neck, 5 diaphragmatic breaths, right axillary nodes and establishment of interaxillary pathway, left inguinal nodes and establishment of axillo inguinal pathway, L breast moving fluid towards pathways then retracing all steps MFR: to cording in L axilla extending down to forearm   09/01/2021 Soft tissue mobilization to left pectorals,UT, and lats in supine and in SL to left scapular area. PROM to left shoulder flexion, scaption, ABD, ER. MFR techniques to left arm area of cording. Supine AROM shoulder flexion, scaption, horizontal abduction x5 Lower trunk rotation with arms extended Pt was educated in desensitization techniques for the axillary region  PATIENT EDUCATION:  Education details:self MLD, scar mobliization Person educated: Patient Education method: Explanation, Demonstration, Corporate treasurer cues, Verbal cues, and Handouts Education comprehension: returned demonstration   HOME EXERCISE PROGRAM:  Reviewed previously given post op HEP. Pt had not been doing clasped hands flexion or abd wall slides, but had been doing flexion wall slides. Educated on importance and gave another copy of post op exercises  ASSESSMENT:  CLINICAL IMPRESSION: Remeasured pt's L shoulder ROM and it has improved greatly from eval. Her L shoulder ROM is now better than it was at baseline and pt reports the tightness in her L axilla is greatly improved. Instructed pt today in self MLD for L breast edema and also scar mobilization which pt was able to return demonstrate correct  skin stretch technique by end of session.   Pt will benefit from skilled therapeutic intervention to improve on the following deficits: Decreased knowledge of precautions, impaired UE functional use, pain, decreased ROM, postural dysfunction.   PT treatment/interventions: ADL/Self care home management, Therapeutic exercises, Therapeutic activity, Patient/Family education, Joint mobilization, Orthotic/Fit training, Aquatic Therapy, Manual lymph drainage, scar mobilization, and Manual therapy     GOALS: Goals reviewed with patient? Yes  LONG TERM GOALS:  (STG=LTG)  GOALS Name Target Date  Goal status  1 Pt will demonstrate she has regained full shoulder ROM and function post operatively compared to baselines.  Baseline: 11/01/2021 IN PROGRESS  2 Left axillary cording will be resolved 10/06/2021 INITIAL  3 Pt will improve quick dash to no greater than 20% 10/06/2021 INITIAL  4 Pt will have arm/shoulder pain no greater than 3/10 10/06/2021 INITIAL  5 Pt will report a 50% improvement in pain in L breast to allow improved comfort. 10/06/21 NEW     PLAN: PT FREQUENCY/DURATION: 2x/week for 6 weeks  PLAN FOR NEXT SESSION: STM to left upper quarter, MFR to upper arm cord, review HEP, AA/PROM, pics of LTR.  Continue MLD to L breast and instruct pt,Progress to strength as tolerated.   [facility] [place], PT 09/20/2021, 8:57 AM

## 2021-09-20 NOTE — Progress Notes (Signed)
Pharmacist Chemotherapy Monitoring - Initial Assessment    Anticipated start date: 09/28/21   The following has been reviewed per standard work regarding the patient's treatment regimen: The patient's diagnosis, treatment plan and drug doses, and organ/hematologic function Lab orders and baseline tests specific to treatment regimen  The treatment plan start date, drug sequencing, and pre-medications Prior authorization status  Patient's documented medication list, including drug-drug interaction screen and prescriptions for anti-emetics and supportive care specific to the treatment regimen The drug concentrations, fluid compatibility, administration routes, and timing of the medications to be used The patient's access for treatment and lifetime cumulative dose history, if applicable  The patient's medication allergies and previous infusion related reactions, if applicable   Changes made to treatment plan:  N/A  Follow up needed:  N/A   Larene Beach, RPH, 09/20/2021  4:26 PM

## 2021-09-21 ENCOUNTER — Other Ambulatory Visit (HOSPITAL_BASED_OUTPATIENT_CLINIC_OR_DEPARTMENT_OTHER): Payer: Self-pay

## 2021-09-22 ENCOUNTER — Other Ambulatory Visit (HOSPITAL_BASED_OUTPATIENT_CLINIC_OR_DEPARTMENT_OTHER): Payer: Self-pay

## 2021-09-22 ENCOUNTER — Encounter (INDEPENDENT_AMBULATORY_CARE_PROVIDER_SITE_OTHER): Payer: Self-pay

## 2021-09-22 ENCOUNTER — Ambulatory Visit: Payer: 59 | Admitting: Physical Therapy

## 2021-09-22 MED ORDER — LIDOCAINE-PRILOCAINE 2.5-2.5 % EX CREA
TOPICAL_CREAM | CUTANEOUS | 3 refills | Status: AC
  Filled 2021-09-22: qty 30, 10d supply, fill #0
  Filled 2021-12-06: qty 30, 10d supply, fill #1
  Filled 2022-01-04: qty 30, 10d supply, fill #2

## 2021-09-23 ENCOUNTER — Encounter: Payer: Self-pay | Admitting: Hematology and Oncology

## 2021-09-23 ENCOUNTER — Other Ambulatory Visit (HOSPITAL_COMMUNITY): Payer: Self-pay | Admitting: Radiology

## 2021-09-23 ENCOUNTER — Telehealth: Payer: Self-pay | Admitting: Obstetrics and Gynecology

## 2021-09-23 DIAGNOSIS — Z170 Estrogen receptor positive status [ER+]: Secondary | ICD-10-CM

## 2021-09-23 NOTE — Telephone Encounter (Signed)
   Darlene Peters DOB: Dec 23, 1970 MRN: [id number]   RIDER WAIVER AND RELEASE OF LIABILITY  For purposes of improving physical access to our facilities, [facility] is pleased to partner with third parties to provide [facility] patients or other authorized individuals the option of convenient, on-demand ground transportation services (the [place]") through use of the technology service that enables users to request on-demand ground transportation from independent third-party providers.  By opting to use and accept these [employer], I, the undersigned, hereby agree on behalf of myself, and on behalf of any minor child using the Government social research officer for whom I am the parent or legal guardian, as follows:  Government social research officer provided to me are provided by independent third-party transportation providers who are not [employer] or employees and who are unaffiliated with [employer]. [place] is neither a transportation carrier nor a common or public carrier. [place] [facility] has no control over the quality or safety of the transportation that occurs as a result of the [employer]. [place] cannot guarantee that any third-party transportation provider will complete any arranged transportation service. [facility] makes no representation, warranty, or guarantee regarding the reliability, timeliness, quality, safety, suitability, or availability of any of the Transport Services or that they will be error free. I fully understand that traveling by vehicle involves risks and dangers of serious bodily injury, including permanent disability, paralysis, and death. I agree, on behalf of myself and on behalf of any minor child using the Transport Services for whom I am the parent or legal guardian, that the entire risk arising out of my use of the [employer] remains solely with me, to the maximum extent permitted under applicable law. The [employer] are provided "as  is" and "as available." [facility] [facility] disclaims all representations and warranties, express, implied or statutory, not expressly set out in these terms, including the implied warranties of merchantability and fitness for a particular purpose. I hereby waive and release [facility], its agents, employees, officers, directors, representatives, insurers, attorneys, assigns, successors, subsidiaries, and affiliates from any and all past, present, or future claims, demands, liabilities, actions, causes of action, or suits of any kind directly or indirectly arising from acceptance and use of the [employer]. I further waive and release [facility] and its affiliates from all present and future liability and responsibility for any injury or death to persons or damages to property caused by or related to the use of the [employer]. I have read this Waiver and Release of Liability, and I understand the terms used in it and their legal significance. This Waiver is freely and voluntarily given with the understanding that my right (as well as the right of any minor child for whom I am the parent or legal guardian using the [employer]) to legal recourse against  in connection with the [employer] is knowingly surrendered in return for use of these services.   I attest that I read the consent document to Darlene Peters, gave Ms. Gaffin the opportunity to ask questions and answered the questions asked (if any). I affirm that Darlene Peters then provided consent for she's participation in this program.     Darlene Peters

## 2021-09-23 NOTE — Progress Notes (Signed)
Christian w/ transportation responded back to my message as the patient was calling me again to inquire about transportation for Monday to other appointment.  I approved a one-time exception for this to be taken care of immediately being that it is Friday afternoon and appointment being Monday morning.  Advised patient per [place] he would be contacting her immediately with information. She was very appreciative and confirmed she received my other message regarding Wed for [place] paperwork.  She has my card for any additional financial questions or concerns.

## 2021-09-23 NOTE — Progress Notes (Signed)
Correction to previous note: Appointment at [facility] is next Wednesday regarding [place] paperwork.  Patient notified.

## 2021-09-23 NOTE — Progress Notes (Signed)
Patient called regarding transportation for Monday.  Staff message sent to [place] and [place] for follow up.  Patient also states she will be needing to apply for [employer]. Advised patient what is needed and to present to registration on Monday at check-in. Advised once this has been done, she will be given paperwork to complete and then to call me at earliest convenience to discuss.  She has my card for any additional financial questions or concerns.

## 2021-09-26 ENCOUNTER — Ambulatory Visit (HOSPITAL_COMMUNITY)
Admission: RE | Admit: 2021-09-26 | Discharge: 2021-09-26 | Disposition: A | Discharge status: Home / Self Care | Payer: 59 | Source: Ambulatory Visit | Attending: Hematology and Oncology | Admitting: Hematology and Oncology

## 2021-09-26 ENCOUNTER — Inpatient Hospital Stay: Payer: 59

## 2021-09-26 ENCOUNTER — Other Ambulatory Visit: Payer: Self-pay | Admitting: Hematology and Oncology

## 2021-09-26 ENCOUNTER — Encounter (HOSPITAL_COMMUNITY): Payer: Self-pay

## 2021-09-26 ENCOUNTER — Other Ambulatory Visit: Payer: Self-pay

## 2021-09-26 DIAGNOSIS — C50412 Malignant neoplasm of upper-outer quadrant of left female breast: Secondary | ICD-10-CM

## 2021-09-26 DIAGNOSIS — J45909 Unspecified asthma, uncomplicated: Secondary | ICD-10-CM | POA: Insufficient documentation

## 2021-09-26 DIAGNOSIS — Z5111 Encounter for antineoplastic chemotherapy: Secondary | ICD-10-CM | POA: Diagnosis not present

## 2021-09-26 DIAGNOSIS — Z170 Estrogen receptor positive status [ER+]: Secondary | ICD-10-CM | POA: Insufficient documentation

## 2021-09-26 DIAGNOSIS — K219 Gastro-esophageal reflux disease without esophagitis: Secondary | ICD-10-CM | POA: Insufficient documentation

## 2021-09-26 HISTORY — PX: IR IMAGING GUIDED PORT INSERTION: IMG5740

## 2021-09-26 MED ORDER — MIDAZOLAM HCL 2 MG/2ML IJ SOLN
INTRAMUSCULAR | Status: AC | PRN
  Administered 2021-09-26 (×2): 1 mg via INTRAVENOUS

## 2021-09-26 MED ORDER — MIDAZOLAM HCL 2 MG/2ML IJ SOLN
INTRAMUSCULAR | Status: AC
  Filled 2021-09-26: qty 2

## 2021-09-26 MED ORDER — SODIUM CHLORIDE 0.9 % IV SOLN: INTRAVENOUS | Status: DC

## 2021-09-26 MED ORDER — LIDOCAINE HCL 1 % IJ SOLN
INTRAMUSCULAR | Status: AC
  Administered 2021-09-26: 15 mL
  Filled 2021-09-26: qty 20

## 2021-09-26 MED ORDER — FENTANYL CITRATE (PF) 100 MCG/2ML IJ SOLN
INTRAMUSCULAR | Status: AC | PRN
  Administered 2021-09-26 (×2): 50 ug via INTRAVENOUS

## 2021-09-26 MED ORDER — HEPARIN SOD (PORK) LOCK FLUSH 100 UNIT/ML IV SOLN
INTRAVENOUS | Status: AC | PRN
  Administered 2021-09-26: 500 [IU] via INTRAVENOUS

## 2021-09-26 MED ORDER — HEPARIN SOD (PORK) LOCK FLUSH 100 UNIT/ML IV SOLN
INTRAVENOUS | Status: AC
  Filled 2021-09-26: qty 5

## 2021-09-26 MED ORDER — FENTANYL CITRATE (PF) 100 MCG/2ML IJ SOLN
INTRAMUSCULAR | Status: AC
  Filled 2021-09-26: qty 2

## 2021-09-26 NOTE — Consult Note (Signed)
Chief Complaint: Patient was seen in consultation today for  port a cath placement  Referring Physician(s): Iruku,Praveena  Supervising Physician: Corrie Mckusick  Patient Status: [facility] - Out-pt  History of Present Illness: Darlene Peters is a 51 y.o. female with PMH sig for asthma, GERD, psoriasis and now with newly diagnosed left breast cancer, s/p left lumpectomy with radioactive seed and sentinel lymph node biopsy on 08/03/2021.  She presents today for Port-A-Cath placement to assist with treatment.  Past Medical History:  Diagnosis Date   Asthma    "worse when lying down"   Breast cancer ([place])    Family history of breast cancer 07/19/2021   Family history of prostate cancer 07/19/2021   GERD (gastroesophageal reflux disease)    Psoriasis    Urticaria     Past Surgical History:  Procedure Laterality Date   ABDOMINAL HYSTERECTOMY     partial   BREAST BIOPSY Left 05/26/2021   BREAST LUMPECTOMY WITH RADIOACTIVE SEED AND SENTINEL LYMPH NODE BIOPSY Left 08/03/2021   Procedure: LEFT BREAST LUMPECTOMY WITH RADIOACTIVE SEED AND SENTINEL LYMPH NODE BIOPSY;  Surgeon: Jovita Kussmaul, MD;  Location: [place];  Service: General;  Laterality: Left;   RADIOACTIVE SEED GUIDED AXILLARY SENTINEL LYMPH NODE Left 08/03/2021   Procedure: RADIOACTIVE SEED GUIDED LEFT AXILLARY SENTINEL LYMPH NODE DISSECTION;  Surgeon: Jovita Kussmaul, MD;  Location: [place];  Service: General;  Laterality: Left;   TUMOR REMOVAL Left 08/2021   breast    Allergies: Shellfish allergy, Gluten meal, and Latex  Medications: Prior to Admission medications   Medication Sig Start Date End Date Taking? Authorizing Provider  albuterol (PROVENTIL HFA) 108 (90 Base) MCG/ACT inhaler Inhale 2 puffs into the lungs every 4 (four) hours as needed for wheezing or shortness of breath. 08/09/21  Yes Valentina Shaggy, MD  buPROPion (WELLBUTRIN XL) 150 MG 24 hr tablet Take one tablet by  mouth in the morning along with the '300mg'$  dose. 09/16/21  Yes   buPROPion (WELLBUTRIN XL) 300 MG 24 hr tablet Take one tablet ('300mg'$ ) by mouth daily. 09/16/21  Yes   clobetasol (TEMOVATE) 0.05 % external solution Apply topically to affected areas twice daily as needed (not to face,groin,or axilla) 07/27/21  Yes Allyn Kenner, MD  desonide (DESOWEN) 0.05 % lotion Apply topically to affected areas twice daily as needed (not to face,groin,or axilla) 07/27/21  Yes Allyn Kenner, MD  dexamethasone (DECADRON) 4 MG tablet Take 2 tablets (8 mg total) by mouth 2 (two) times daily. Start the day before Taxotere. Then again the day after chemo for 3 days. 09/19/21  Yes Iruku, Arletha Pili, MD  fluticasone-salmeterol (ADVAIR DISKUS) 250-50 MCG/ACT AEPB Inhale 1 puff into the lungs in the morning and at bedtime. 08/09/21 10/22/21 Yes Valentina Shaggy, MD  gabapentin (NEURONTIN) 100 MG capsule Take 1 capsule (100 mg total) by mouth 3 (three) times daily. 08/03/21  Yes Autumn Messing III, MD  levocetirizine (XYZAL) 5 MG tablet Take 1 tablet (5 mg total) by mouth every evening. 08/09/21  Yes Valentina Shaggy, MD  lidocaine-prilocaine (EMLA) cream Apply to affected area once 09/14/21  Yes Iruku, Arletha Pili, MD  lidocaine-prilocaine (EMLA) cream Apply to affected area once. 09/14/21  Yes Benay Pike, MD  triamcinolone cream (KENALOG) 0.1 % Apply topically to affected areas twice daily as needed (not to face,groin,or axilla) 07/27/21  Yes Allyn Kenner, MD  ondansetron (ZOFRAN) 8 MG tablet Take 1 tablet (8 mg total) by mouth 2 (two) times  daily as needed for refractory nausea / vomiting. Start on day 3 after chemo. 09/19/21   Benay Pike, MD  prochlorperazine (COMPAZINE) 10 MG tablet Take 1 tablet (10 mg total) by mouth every 6 (six) hours as needed for nausea or vomiting. 09/19/21   Benay Pike, MD     Family History  Problem Relation Age of Onset   Allergic rhinitis Mother    Allergic rhinitis Sister    Allergic rhinitis  Sister    Breast cancer Maternal Aunt 45   Prostate cancer Maternal Uncle        mets; dx after 50   Prostate cancer Paternal Uncle    Leukemia Paternal Uncle    Prostate cancer Paternal Uncle 16   Gastric cancer Paternal Uncle    Skin cancer Maternal Grandmother 46   Breast cancer Cousin 9       maternal cousin   Cervical cancer Cousin    Breast cancer Cousin        dx 29s; maternal female cousin   Breast cancer Cousin        paternal female cousin x2    Social History   Socioeconomic History   Marital status: Married    Spouse name: Not on file   Number of children: Not on file   Years of education: Not on file   Highest education level: Not on file  Occupational History   Not on file  Tobacco Use   Smoking status: Never    Passive exposure: Never   Smokeless tobacco: Never  Vaping Use   Vaping Use: Never used  Substance and Sexual Activity   Alcohol use: No   Drug use: No   Sexual activity: Yes    Birth control/protection: Surgical  Other Topics Concern   Not on file  Social History Narrative   Not on file   Social Determinants of Health   Financial Resource Strain: High Risk (07/14/2021)   Overall Financial Resource Strain (CARDIA)    Difficulty of Paying Living Expenses: Hard  Food Insecurity: Not on file  Transportation Needs: No Transportation Needs (09/23/2021)   PRAPARE - Transportation    Lack of Transportation (Medical): No    Lack of Transportation (Non-Medical): No  Physical Activity: Not on file  Stress: Not on file  Social Connections: Not on file      Review of Systems currently denies fever, chest pain, dyspnea, abdominal pain, nausea, vomiting or bleeding.  She does have occasional headaches, occasional cough, and back pain.  Vital Signs: BP 133/85   Pulse 84   Temp 97.8 F (36.6 C) (Oral)   Resp 17   Ht '5\' 8"'$  (1.727 m)   Wt 200 lb 6.4 oz (90.9 kg)   LMP  (LMP Unknown)   SpO2 100%   BMI 30.47 kg/m     Physical Exam awake,  alert.  Chest clear to auscultation bilaterally.  Heart with regular rate and rhythm.  Abdomen soft, positive bowel sounds, nontender.  No lower extremity edema.  Imaging: No results found.  Labs:  CBC: Recent Labs    08/09/21 1505  WBC 6.3  HGB 14.0  HCT 40.9    COAGS: No results for input(s): "INR", "APTT" in the last 8760 hours.  BMP: Recent Labs    08/09/21 1505  NA 140  K 3.9  CL 105  CO2 23  GLUCOSE 86  BUN 11  CALCIUM 9.2  CREATININE 0.71    LIVER FUNCTION TESTS: Recent Labs  08/09/21 1505  BILITOT 0.9  AST 14  ALT 16  ALKPHOS 51  PROT 6.5  ALBUMIN 4.3    TUMOR MARKERS: No results for input(s): "AFPTM", "CEA", "CA199", "CHROMGRNA" in the last 8760 hours.  Assessment and Plan: 51 y.o. female with PMH sig for asthma, GERD, psoriasis and now with newly diagnosed left breast cancer, s/p left lumpectomy with radioactive seed and sentinel lymph node biopsy on 08/03/2021.  She presents today for Port-A-Cath placement to assist with treatment.Risks and benefits of image guided port-a-catheter placement was discussed with the patient including, but not limited to bleeding, infection, pneumothorax, or fibrin sheath development and need for additional procedures.  All of the patient's questions were answered, patient is agreeable to proceed. Consent signed and in chart.    Thank you for this interesting consult.  I greatly enjoyed meeting Darlene Peters and look forward to participating in their care.  A copy of this report was sent to the requesting provider on this date.  Electronically Signed: D. Rowe Robert, PA-C 09/26/2021, 10:08 AM   I spent a total of  25 minutes   in face to face in clinical consultation, greater than 50% of which was counseling/coordinating care for port a cath placement

## 2021-09-26 NOTE — Discharge Instructions (Signed)
You may remove your dressing and shower tomorrow afternoon  DO NOT use EMLA cream for 2 weeks after port placement as the cream will remove surgical glue on your incision.   For questions /concerns may call Interventional Radiology at [phone] or  Interventional Radiology clinic [phone]    Implanted Port Insertion, Care After This sheet gives you information about how to care for yourself after your procedure. Your health care provider may also give you more specific instructions. If you have problems or questions, contact your health careprovider. What can I expect after the procedure? After the procedure, it is common to have: Discomfort at the port insertion site. Bruising on the skin over the port. This should improve over 3-4 days. Follow these instructions at home: [facility] care After your port is placed, you will get a manufacturer's information card. The card has information about your port. Keep this card with you at all times. Take care of the port as told by your health care provider. Ask your health care provider if you or a family member can get training for taking care of the port at home. A home health care nurse may also take care of the port. Make sure to remember what type of port you have. Incision care Follow instructions from your health care provider about how to take care of your port insertion site. Make sure you: Wash your hands with soap and water before and after you change your bandage (dressing). If soap and water are not available, use hand sanitizer. Change your dressing as told by your health care provider. Leave skin glue, or adhesive strips in place. These skin closures may need to stay in place for 2 weeks or longer.  Check your port insertion site every day for signs of infection. Check for:      - Redness, swelling, or pain.                     - Fluid or blood.      - Warmth.      - Pus or a bad smell. Activity Return to your normal activities  as told by your health care provider. Ask your health care provider what activities are safe for you. Do not lift anything that is heavier than 10 lb (4.5 kg), or the limit that you are told, until your health care provider says that it is safe. General instructions Take over-the-counter and prescription medicines only as told by your health care provider. Do not take baths, swim, or use a hot tub until your health care provider approves. Ask your health care provider if you may take showers. You may only be allowed to take sponge baths. Do not drive for 24 hours if you were given a sedative during your procedure. Wear a medical alert bracelet in case of an emergency. This will tell any health care providers that you have a port. Keep all follow-up visits as told by your health care provider. This is important. Contact a health care provider if: You cannot flush your port with saline as directed, or you cannot draw blood from the port. You have a fever or chills. You have redness, swelling, or pain around your port insertion site. You have fluid or blood coming from your port insertion site. Your port insertion site feels warm to the touch. You have pus or a bad smell coming from the port insertion site. Get help right away if: You have chest pain or shortness  of breath. You have bleeding from your port that you cannot control. Summary Take care of the port as told by your health care provider. Keep the manufacturer's information card with you at all times. Change your dressing as told by your health care provider. Contact a health care provider if you have a fever or chills or if you have redness, swelling, or pain around your port insertion site. Keep all follow-up visits as told by your health care provider. This information is not intended to replace advice given to you by your health care provider. Make sure you discuss any questions you have with your healthcare provider.  Moderate  Conscious Sedation, Adult, Care After This sheet gives you information about how to care for yourself after your procedure. Your health care provider may also give you more specific instructions. If you have problems or questions, contact your health careprovider. What can I expect after the procedure? After the procedure, it is common to have: Sleepiness for several hours. Impaired judgment for several hours. Difficulty with balance. Vomiting if you eat too soon. Follow these instructions at home: For the time period you were told by your health care provider: Rest. Do not participate in activities where you could fall or become injured. Do not drive or use machinery. Do not drink alcohol. Do not take sleeping pills or medicines that cause drowsiness. Do not make important decisions or sign legal documents. Do not take care of children on your own. Eating and drinking  Follow the diet recommended by your health care provider. Drink enough fluid to keep your urine pale yellow. If you vomit: Drink water, juice, or soup when you can drink without vomiting. Make sure you have little or no nausea before eating solid foods.  General instructions Take over-the-counter and prescription medicines only as told by your health care provider. Have a responsible adult stay with you for the time you are told. It is important to have someone help care for you until you are awake and alert. Do not smoke. Keep all follow-up visits as told by your health care provider. This is important. Contact a health care provider if: You are still sleepy or having trouble with balance after 24 hours. You feel light-headed. You keep feeling nauseous or you keep vomiting. You develop a rash. You have a fever. You have redness or swelling around the IV site. Get help right away if: You have trouble breathing. You have new-onset confusion at home. Summary After the procedure, it is common to feel sleepy, have  impaired judgment, or feel nauseous if you eat too soon. Rest after you get home. Know the things you should not do after the procedure. Follow the diet recommended by your health care provider and drink enough fluid to keep your urine pale yellow. Get help right away if you have trouble breathing or new-onset confusion at home. This information is not intended to replace advice given to you by your health care provider. Make sure you discuss any questions you have with your healthcare provider. Document Revised: 06/20/2019 Document Reviewed: 01/16/2019 Elsevier Patient Education  2022 [employer].

## 2021-09-26 NOTE — Procedures (Signed)
Interventional Radiology Procedure Note  Procedure: Placement of a right IJ approach single lumen PowerPort.  Tip is positioned at the superior cavoatrial junction and catheter is ready for immediate use.  Complications: None Recommendations:  - Ok to shower tomorrow - Do not submerge for 7 days - Routine line care   Signed,  Felix Meras S. Tykia Mellone, DO   

## 2021-09-27 ENCOUNTER — Ambulatory Visit: Payer: 59

## 2021-09-27 ENCOUNTER — Encounter: Payer: Self-pay | Admitting: Hematology and Oncology

## 2021-09-27 MED FILL — Dexamethasone Sodium Phosphate Inj 100 MG/10ML: INTRAMUSCULAR | Qty: 1 | Status: AC

## 2021-09-27 NOTE — Progress Notes (Signed)
Patient called to obtain email address to email income document for [employer].  Information received. Emailed patient to advise received and how to proceed with next steps tomorrow at her appointment.  She has my contact name and number for any additional financial questions or concerns. My card will be given tomorrow.

## 2021-09-28 ENCOUNTER — Inpatient Hospital Stay: Payer: 59

## 2021-09-28 ENCOUNTER — Inpatient Hospital Stay (HOSPITAL_BASED_OUTPATIENT_CLINIC_OR_DEPARTMENT_OTHER): Payer: 59 | Admitting: Physician Assistant

## 2021-09-28 ENCOUNTER — Encounter: Payer: Self-pay | Admitting: Hematology and Oncology

## 2021-09-28 ENCOUNTER — Encounter: Payer: Self-pay | Admitting: *Deleted

## 2021-09-28 ENCOUNTER — Inpatient Hospital Stay (HOSPITAL_BASED_OUTPATIENT_CLINIC_OR_DEPARTMENT_OTHER): Payer: 59 | Admitting: Hematology and Oncology

## 2021-09-28 ENCOUNTER — Other Ambulatory Visit: Payer: Self-pay

## 2021-09-28 VITALS — BP 150/82 | HR 83 | Temp 98.2°F | Resp 18

## 2021-09-28 DIAGNOSIS — Z170 Estrogen receptor positive status [ER+]: Secondary | ICD-10-CM | POA: Diagnosis not present

## 2021-09-28 DIAGNOSIS — T50905A Adverse effect of unspecified drugs, medicaments and biological substances, initial encounter: Secondary | ICD-10-CM

## 2021-09-28 DIAGNOSIS — Z5111 Encounter for antineoplastic chemotherapy: Secondary | ICD-10-CM | POA: Diagnosis not present

## 2021-09-28 DIAGNOSIS — C50412 Malignant neoplasm of upper-outer quadrant of left female breast: Secondary | ICD-10-CM

## 2021-09-28 LAB — CMP (CANCER CENTER ONLY)
ALT: 30 U/L (ref 0–44)
AST: 19 U/L (ref 15–41)
Albumin: 4.3 g/dL (ref 3.5–5.0)
Alkaline Phosphatase: 50 U/L (ref 38–126)
Anion gap: 5 (ref 5–15)
BUN: 9 mg/dL (ref 6–20)
CO2: 24 mmol/L (ref 22–32)
Calcium: 9.2 mg/dL (ref 8.9–10.3)
Chloride: 107 mmol/L (ref 98–111)
Creatinine: 0.67 mg/dL (ref 0.44–1.00)
GFR, Estimated: >60 mL/min (ref >60)
Glucose, Bld: 120 mg/dL — ABNORMAL HIGH (ref 70–99)
Potassium: 3.9 mmol/L (ref 3.5–5.1)
Sodium: 136 mmol/L (ref 135–145)
Total Bilirubin: 0.7 mg/dL (ref 0.3–1.2)
Total Protein: 7.2 g/dL (ref 6.5–8.1)

## 2021-09-28 LAB — CBC WITH DIFFERENTIAL (CANCER CENTER ONLY)
Abs Immature Granulocytes: 0.04 10*3/uL (ref 0.00–0.07)
Basophils Absolute: 0 10*3/uL (ref 0.0–0.1)
Basophils Relative: 0 %
Eosinophils Absolute: 0 10*3/uL (ref 0.0–0.5)
Eosinophils Relative: 0 %
HCT: 36.3 % (ref 36.0–46.0)
Hemoglobin: 13 g/dL (ref 12.0–15.0)
Immature Granulocytes: 0 %
Lymphocytes Relative: 6 %
Lymphs Abs: 0.7 10*3/uL (ref 0.7–4.0)
MCH: 31.3 pg (ref 26.0–34.0)
MCHC: 35.8 g/dL (ref 30.0–36.0)
MCV: 87.5 fL (ref 80.0–100.0)
Monocytes Absolute: 0.5 10*3/uL (ref 0.1–1.0)
Monocytes Relative: 4 %
Neutro Abs: 10.9 10*3/uL — ABNORMAL HIGH (ref 1.7–7.7)
Neutrophils Relative %: 90 %
Platelet Count: 186 10*3/uL (ref 150–400)
RBC: 4.15 MIL/uL (ref 3.87–5.11)
RDW: 12.9 % (ref 11.5–15.5)
WBC Count: 12.1 10*3/uL — ABNORMAL HIGH (ref 4.0–10.5)
nRBC: 0 % (ref 0.0–0.2)

## 2021-09-28 MED ORDER — FAMOTIDINE IN NACL 20-0.9 MG/50ML-% IV SOLN
20.0000 mg | Freq: Once | INTRAVENOUS | Status: AC | PRN
  Administered 2021-09-28: 20 mg via INTRAVENOUS

## 2021-09-28 MED ORDER — SODIUM CHLORIDE 0.9% FLUSH
10.0000 mL | INTRAVENOUS | Status: AC | PRN
  Administered 2021-09-28: 10 mL

## 2021-09-28 MED ORDER — SODIUM CHLORIDE 0.9 % IV SOLN
75.0000 mg/m2 | Freq: Once | INTRAVENOUS | Status: AC
  Administered 2021-09-28: 160 mg via INTRAVENOUS
  Filled 2021-09-28: qty 16

## 2021-09-28 MED ORDER — SODIUM CHLORIDE 0.9 % IV SOLN: Freq: Once | INTRAVENOUS | Status: AC

## 2021-09-28 MED ORDER — SODIUM CHLORIDE 0.9 % IV SOLN
10.0000 mg | Freq: Once | INTRAVENOUS | Status: AC
  Administered 2021-09-28: 10 mg via INTRAVENOUS
  Filled 2021-09-28: qty 10

## 2021-09-28 MED ORDER — DIPHENHYDRAMINE HCL 50 MG/ML IJ SOLN
50.0000 mg | Freq: Once | INTRAMUSCULAR | Status: AC | PRN
  Administered 2021-09-28: 25 mg via INTRAVENOUS

## 2021-09-28 MED ORDER — SODIUM CHLORIDE 0.9 % IV SOLN
600.0000 mg/m2 | Freq: Once | INTRAVENOUS | Status: AC
  Administered 2021-09-28: 1260 mg via INTRAVENOUS
  Filled 2021-09-28: qty 63

## 2021-09-28 MED ORDER — SODIUM CHLORIDE 0.9% FLUSH
10.0000 mL | INTRAVENOUS | Status: DC | PRN
  Administered 2021-09-28: 10 mL

## 2021-09-28 MED ORDER — PEGFILGRASTIM 6 MG/0.6ML ~~LOC~~ PSKT
6.0000 mg | PREFILLED_SYRINGE | Freq: Once | SUBCUTANEOUS | Status: AC
  Administered 2021-09-28: 6 mg via SUBCUTANEOUS
  Filled 2021-09-28: qty 0.6

## 2021-09-28 MED ORDER — PALONOSETRON HCL INJECTION 0.25 MG/5ML
0.2500 mg | Freq: Once | INTRAVENOUS | Status: AC
  Administered 2021-09-28: 0.25 mg via INTRAVENOUS
  Filled 2021-09-28: qty 5

## 2021-09-28 MED ORDER — SODIUM CHLORIDE 0.9 % IV SOLN: 25.0000 mg | Freq: Once | INTRAVENOUS | Status: DC

## 2021-09-28 MED ORDER — HEPARIN SOD (PORK) LOCK FLUSH 100 UNIT/ML IV SOLN
500.0000 [IU] | Freq: Once | INTRAVENOUS | Status: AC | PRN
  Administered 2021-09-28: 500 [IU]

## 2021-09-28 NOTE — Progress Notes (Signed)
Hypersensitivity Reaction note  Date of event: 09/28/21 Time of event: 1352 Generic name of drug involved: Docetaxel Name of provider notified of the hypersensitivity reaction: K. Walisiewicz PA Was agent that likely caused hypersensitivity reaction added to Allergies List within EMR? yes Chain of events including reaction signs/symptoms, treatment administered, and outcome (e.g., drug resumed; drug discontinued; sent to Emergency Department; etc.)  Patient stated at 1352 she was feeling a burning sensation in her central sternal area. Pain rated as a 5 out of 10. Patient denied SOB, difficulty breathing. Infusion was paused. Burning sensation improved immediately. VSS see flowsheet. Infusion restarted at 1354. Pain returned and infusion stopped again. Patient stated she was beginning to feel some stomach pain, denied calling it nausea.    Darlene Peters, [facility]) called.    Pepcid started at 1405.    1411: Patient reported the stomach feels "funny". She rates this pain as a 3 out of 10.    Report given to Darlene Peters as primary RN.   axotere restarted @ 1427 @ 200 ml/hr, VS stable, see flowsheet, patient states her epigastric pain is now a 1 on the pain scale, no apparent distress.  Monitoring closely.  Patient completed taxotere & cytoxan infusion without difficulty, no further allergic reaction.  DC'd in stable condition.          Addend Delete Tag Copy   Darlene Peters, [facility]  Pharmacist Pharmacy Progress Notes    Signed Creation Time:  09/20/2021  4:26 PM   Signed      Pharmacist Chemotherapy Monitoring - Initial Assessment     Anticipated start date: 09/28/21    The following has been reviewed per standard work regarding the patient's treatment regimen: The patient's diagnosis, treatment plan and drug doses, and organ/hematologic function Lab orders and baseline tests specific to treatment regimen  The treatment plan start date, drug sequencing, and pre-medications Prior  authorization status  Patient's documented medication list, including drug-drug interaction screen and prescriptions for anti-emetics and supportive care specific to the treatment regimen The drug concentrations, fluid compatibility, administration routes, and timing of the medications to be used The patient's access for treatment and lifetime cumulative dose history, if applicable  The patient's medication allergies and previous infusion related reactions, if applicable    Changes made to treatment plan:  N/A   Follow up needed:  N/A     Darlene Peters, RPH, 09/20/2021  4:26 PM           Darlene Shy, RN 09/28/2021 4:33 PM Taxotere restarted @ 1427 @ 200 ml/hr, VS stable, see flowsheet, patient states her epigastric pain is now a 1 on the pain scale, no apparent distress.  Monitoring closely.  Patient completed taxotere & cytoxan infusion without difficulty, no further allergic reaction.  DC'd in stable condition.

## 2021-09-28 NOTE — Progress Notes (Signed)
Patient signed Advertising account executive paperwork at registration.  She was approved for one-time $1000 [place] [place] to assist with personal expenses while going through treatment. She has a copy of the approval letter and expense sheet along with the Outpatient pharmacy information.  She was advised to contact me to discuss [place] expenses in detail at her earliest convenience and has my card for any additional financial questions or concerns.

## 2021-09-28 NOTE — Assessment & Plan Note (Addendum)
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  She is now postsurgery and is here to discuss Oncotype results and role of chemotherapy.  We have discussed final pathology as well as Oncotype score which resulted at 28, distant recurrence risk at 9 years of 17%, absolute benefit of chemotherapy greater than 15%.  Given young age, excellent PS, Oncotype score 28, we have discussed about adjuvant chemotherapy with TC every 21 days for 4 cycles.  We have discussed about adverse effects of chemotherapy including but not limited to fatigue, nausea, alopecia vomiting, increased risk of infections, neuropathy etc.  She understands that some of the side effects can be permanent.    She is here before planned cycle 1 of docetaxel and cyclophosphamide. No concerning review of systems or physical examination findings.  Labs reviewed and satisfactory to proceed.  Okay to proceed with planned cycle of chemotherapy.  Anticipate Neulasta on day 2.  She was instructed to take some Tylenol or oxycodone which she already has for severe arthralgias.  Return to clinic in 1 week for toxicity check and return to clinic with me before planned second cycle of chemotherapy.

## 2021-09-28 NOTE — Progress Notes (Signed)
Dr. Gilberto Better is cone [place] CONSULT NOTE  Patient Care Team: [name], MD as PCP - General (Obstetrics and Gynecology) Rockwell Germany, RN as Oncology Nurse Navigator Tressie Ellis, Paulette Blanch, RN as Oncology Nurse Navigator Benay Pike, MD as Consulting Physician (Hematology and Oncology) Jovita Kussmaul, MD as Consulting Physician (General Surgery) Eppie Gibson, MD as Attending Physician (Radiation Oncology)  CHIEF COMPLAINTS/PURPOSE OF CONSULTATION:  Newly diagnosed breast cancer  HISTORY OF PRESENTING ILLNESS:  HAWRAA STAMBAUGH 51 y.o. female is here because of recent diagnosis of left breast cancer  I reviewed her records extensively and collaborated the history with the patient.  SUMMARY OF ONCOLOGIC HISTORY: Oncology History  Malignant neoplasm of upper-outer quadrant of left breast in female, estrogen receptor positive ([place])  06/14/2021 Initial Diagnosis   Malignant neoplasm of upper-outer quadrant of left breast in female, estrogen receptor positive ([place])   07/26/2021 Genetic Testing   Negative hereditary cancer genetic testing: no pathogenic variants detected in [facility] BRCAPlus Panel and CancerNext +RNAinsight Panel.  Report dates are Jul 26, 2021 and August 08, 2021.   The BRCAplus panel offered by [employer] and includes sequencing and deletion/duplication analysis for the following 8 genes: ATM, BRCA1, BRCA2, CDH1, CHEK2, PALB2, PTEN, and TP53.  The CancerNext gene panel offered by [employer] includes sequencing, rearrangement analysis, and RNA analysis for the following 36 genes:   APC, ATM, AXIN2, BARD1, BMPR1A, BRCA1, BRCA2, BRIP1, CDH1, CDK4, CDKN2A, CHEK2, DICER1, HOXB13, EPCAM, GREM1, MLH1, MSH2, MSH3, MSH6, MUTYH, NBN, NF1, NTHL1, PALB2, PMS2, POLD1, POLE, PTEN, RAD51C, RAD51D, RECQL, SMAD4, SMARCA4, STK11, and TP53.    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 Oncotype score of 28, distant recurrence risk at 9 years of 17%, absolute benefit of chemotherapy greater than 15% on Oncotype she tested positive for ER and PR and HER2 negative   09/28/2021 -  Chemotherapy   Patient is on Treatment Plan : BREAST TC q21d     She works at [employer]. Husband is estranged. She has 4 kids, one daughter and three son. Daughters are 20, sons are 33, 42 and 36. She has 4 grandchildren. Oldest son lives here.    Interval History  She is here before anticipated first cycle of docetaxel and cyclophosphamide. Since last visit she had a port placed.  She complained of some pain immediately after the port placement and had to take some oxycodone.  She is still very sore in her left breast as well as left axilla but she is not using any pain medication.  She also reports some mild bilateral knee swelling and ankle swelling with minimal pain about 2 out of 10.  She denies any other new complaints.  She is ready for first cycle of chemotherapy planned today. Rest of the pertinent 10 point ROS reviewed and negative.  MEDICAL HISTORY:  Past Medical History:  Diagnosis Date   Asthma    "worse when lying down"   Breast cancer [facility])    Family history of breast cancer 07/19/2021   Family history of prostate cancer 07/19/2021   GERD (gastroesophageal reflux disease)    Psoriasis    Urticaria     SURGICAL HISTORY: Past Surgical History:  Procedure Laterality Date   ABDOMINAL HYSTERECTOMY     partial  BREAST BIOPSY Left 05/26/2021   BREAST LUMPECTOMY WITH RADIOACTIVE SEED AND SENTINEL LYMPH NODE BIOPSY Left 08/03/2021   Procedure: LEFT BREAST LUMPECTOMY WITH RADIOACTIVE SEED AND SENTINEL LYMPH NODE BIOPSY;  Surgeon: Jovita Kussmaul, MD;  Location: [place];  Service: General;  Laterality: Left;   IR  IMAGING GUIDED PORT INSERTION  09/26/2021   RADIOACTIVE SEED GUIDED AXILLARY SENTINEL LYMPH NODE Left 08/03/2021   Procedure: RADIOACTIVE SEED GUIDED LEFT AXILLARY SENTINEL LYMPH NODE DISSECTION;  Surgeon: Jovita Kussmaul, MD;  Location: [place];  Service: General;  Laterality: Left;   TUMOR REMOVAL Left 08/2021   breast    SOCIAL HISTORY: Social History   Socioeconomic History   Marital status: Married    Spouse name: Not on file   Number of children: Not on file   Years of education: Not on file   Highest education level: Not on file  Occupational History   Not on file  Tobacco Use   Smoking status: Never    Passive exposure: Never   Smokeless tobacco: Never  Vaping Use   Vaping Use: Never used  Substance and Sexual Activity   Alcohol use: No   Drug use: No   Sexual activity: Yes    Birth control/protection: Surgical  Other Topics Concern   Not on file  Social History Narrative   Not on file   Social Determinants of Health   Financial Resource Strain: High Risk (07/14/2021)   Overall Financial Resource Strain (CARDIA)    Difficulty of Paying Living Expenses: Hard  Food Insecurity: Not on file  Transportation Needs: No Transportation Needs (09/27/2021)   PRAPARE - Transportation    Lack of Transportation (Medical): No    Lack of Transportation (Non-Medical): No  Physical Activity: Not on file  Stress: Not on file  Social Connections: Not on file  Intimate Partner Violence: Not on file    FAMILY HISTORY: Family History  Problem Relation Age of Onset   Allergic rhinitis Mother    Allergic rhinitis Sister    Allergic rhinitis Sister    Breast cancer Maternal Aunt 45   Prostate cancer Maternal Uncle        mets; dx after 50   Prostate cancer Paternal Uncle    Leukemia Paternal Uncle    Prostate cancer Paternal Uncle 21   Gastric cancer Paternal Uncle    Skin cancer Maternal Grandmother 90   Breast cancer Cousin 23       maternal cousin    Cervical cancer Cousin    Breast cancer Cousin        dx 65s; maternal female cousin   Breast cancer Cousin        paternal female cousin x2    ALLERGIES:  is allergic to shellfish allergy, fish allergy, gluten meal, and latex.  MEDICATIONS:  Current Outpatient Medications  Medication Sig Dispense Refill   albuterol (PROVENTIL HFA) 108 (90 Base) MCG/ACT inhaler Inhale 2 puffs into the lungs every 4 (four) hours as needed for wheezing or shortness of breath. 8.5 g 2   buPROPion (WELLBUTRIN XL) 150 MG 24 hr tablet Take one tablet by mouth in the morning along with the $Remove'300mg'FbwqBrN$  dose. 30 tablet 0   buPROPion (WELLBUTRIN XL) 300 MG 24 hr tablet Take one tablet ($RemoveBef'300mg'vfCSVdKere$ ) by mouth daily. 30 tablet 0   clobetasol (TEMOVATE) 0.05 % external solution Apply topically to affected areas twice daily as needed (not to face,groin,or axilla) 50 mL 3   desonide (  DESOWEN) 0.05 % lotion Apply topically to affected areas twice daily as needed (not to face,groin,or axilla) 60 mL 3   dexamethasone (DECADRON) 4 MG tablet Take 2 tablets (8 mg total) by mouth 2 (two) times daily. Start the day before Taxotere. Then again the day after chemo for 3 days. 30 tablet 1   fluticasone-salmeterol (ADVAIR DISKUS) 250-50 MCG/ACT AEPB Inhale 1 puff into the lungs in the morning and at bedtime. 60 each 5   gabapentin (NEURONTIN) 100 MG capsule Take 1 capsule (100 mg total) by mouth 3 (three) times daily. 90 capsule 2   levocetirizine (XYZAL) 5 MG tablet Take 1 tablet (5 mg total) by mouth every evening. 30 tablet 5   lidocaine-prilocaine (EMLA) cream Apply to affected area once 30 g 3   lidocaine-prilocaine (EMLA) cream Apply to affected area once. 30 g 3   ondansetron (ZOFRAN) 8 MG tablet Take 1 tablet (8 mg total) by mouth 2 (two) times daily as needed for refractory nausea / vomiting. Start on day 3 after chemo. 30 tablet 1   prochlorperazine (COMPAZINE) 10 MG tablet Take 1 tablet (10 mg total) by mouth every 6 (six) hours as  needed for nausea or vomiting. 30 tablet 1   triamcinolone cream (KENALOG) 0.1 % Apply topically to affected areas twice daily as needed (not to face,groin,or axilla) 453.6 g 3   No current facility-administered medications for this visit.   Facility-Administered Medications Ordered in Other Visits  Medication Dose Route Frequency Provider Last Rate Last Admin   cyclophosphamide (CYTOXAN) 1,260 mg in sodium chloride 0.9 % 250 mL chemo infusion  600 mg/m2 (Treatment Plan Recorded) Intravenous Once Addasyn Mcbreen, Arletha Pili, MD       dexamethasone (DECADRON) 10 mg in sodium chloride 0.9 % 50 mL IVPB  10 mg Intravenous Once Morgen Ritacco, Arletha Pili, MD       DOCEtaxel (TAXOTERE) 160 mg in sodium chloride 0.9 % 250 mL chemo infusion  75 mg/m2 (Treatment Plan Recorded) Intravenous Once Khamila Bassinger, Arletha Pili, MD       heparin lock flush 100 unit/mL  500 Units Intracatheter Once PRN Derrion Tritz, Arletha Pili, MD       palonosetron (ALOXI) injection 0.25 mg  0.25 mg Intravenous Once Gearald Stonebraker, MD       sodium chloride flush (NS) 0.9 % injection 10 mL  10 mL Intracatheter PRN Jahmya Onofrio, MD        REVIEW OF SYSTEMS:   Constitutional: Denies fevers, chills or abnormal night sweats Eyes: Denies blurriness of vision, double vision or watery eyes Ears, nose, mouth, throat, and face: Denies mucositis or sore throat Respiratory: Denies cough, dyspnea or wheezes Cardiovascular: Denies palpitation, chest discomfort or lower extremity swelling Gastrointestinal:  Denies nausea, heartburn or change in bowel habits Skin: Denies abnormal skin rashes Lymphatics: Denies new lymphadenopathy or easy bruising Neurological:Denies numbness, tingling or new weaknesses Behavioral/Psych: Mood is stable, no new changes  Breast: Noticed the dent in the breast along with some nipple retraction in the left breast All other systems were reviewed with the patient and are negative.  PHYSICAL EXAMINATION: ECOG PERFORMANCE STATUS: 0 -  Asymptomatic  Vitals:   09/28/21 1126  BP: (!) 148/68  Pulse: 87  Resp: 18  Temp: (!) 97.4 F (36.3 C)  SpO2: 100%    Filed Weights   09/28/21 1126  Weight: 204 lb (92.5 kg)     Physical Exam Constitutional:      Appearance: Normal appearance.  Chest:     Comments: Left breast and  left axilla appears to be healing well.  Port site appears well Musculoskeletal:        General: No swelling.  Neurological:     Mental Status: She is alert.      LABORATORY DATA:  I have reviewed the data as listed Lab Results  Component Value Date   WBC 12.1 (H) 09/28/2021   HGB 13.0 09/28/2021   HCT 36.3 09/28/2021   MCV 87.5 09/28/2021   PLT 186 09/28/2021   Lab Results  Component Value Date   NA 136 09/28/2021   K 3.9 09/28/2021   CL 107 09/28/2021   CO2 24 09/28/2021    RADIOGRAPHIC STUDIES: I have personally reviewed the radiological reports and agreed with the findings in the report.  ASSESSMENT AND PLAN:  Malignant neoplasm of upper-outer quadrant of left breast in female, estrogen receptor positive ([place]) This is a 51 year old female patient with newly diagnosed left breast invasive ductal carcinoma, grade 2, ER 15% moderate staining, PR negative, HER2 negative, KI of 2% referred to medical oncology for adjuvant recommendations.  She is now postsurgery and is here to discuss Oncotype results and role of chemotherapy.  We have discussed final pathology as well as Oncotype score which resulted at 28, distant recurrence risk at 9 years of 17%, absolute benefit of chemotherapy greater than 15%.  Given young age, excellent PS, Oncotype score 28, we have discussed about adjuvant chemotherapy with TC every 21 days for 4 cycles.  We have discussed about adverse effects of chemotherapy including but not limited to fatigue, nausea, alopecia vomiting, increased risk of infections, neuropathy etc.  She understands that some of the side effects can be permanent.    She is here before  planned cycle 1 of docetaxel and cyclophosphamide. No concerning review of systems or physical examination findings.  Labs reviewed and satisfactory to proceed.  Okay to proceed with planned cycle of chemotherapy.  Anticipate Neulasta on day 2.  She was instructed to take some Tylenol or oxycodone which she already has for severe arthralgias.  Return to clinic in 1 week for toxicity check and return to clinic with me before planned second cycle of chemotherapy.   Total time spent: 30 minutes. All questions were answered. The patient knows to call the clinic with any problems, questions or concerns.    Benay Pike, MD 09/28/21

## 2021-09-28 NOTE — Progress Notes (Signed)
DATE:  09/28/21                                        X CHEMO/IMMUNOTHERAPY REACTION            MD: Chryl Heck   AGENT/BLOOD PRODUCT RECEIVING TODAY:              Cytoxan and taxotere   AGENT/BLOOD PRODUCT RECEIVING IMMEDIATELY PRIOR TO REACTION:          Taxotere   VS: BP:     144/71   P:       91       SPO2:       100% on RA                BP:     155/73   P:       83       SPO2:       100% on RA     REACTION(S):           chest pain, epigastric pain   PREMEDS:     decadron 10 mg IV, aloxi 0.25 mg   INTERVENTION: Pepcid 20 mg, benadryl 25 mg   Review of Systems  Review of Systems  Cardiovascular:  Positive for chest pain.  Gastrointestinal:  Positive for abdominal pain. Negative for nausea.  All other systems reviewed and are negative.    Physical Exam  Physical Exam Vitals and nursing note reviewed.  Constitutional:      Appearance: She is well-developed. She is not ill-appearing or toxic-appearing.  HENT:     Head: Normocephalic and atraumatic.     Nose: Nose normal.  Eyes:     General: No scleral icterus.       Right eye: No discharge.        Left eye: No discharge.     Conjunctiva/sclera: Conjunctivae normal.  Neck:     Vascular: No JVD.  Cardiovascular:     Rate and Rhythm: Normal rate and regular rhythm.     Pulses: Normal pulses.     Heart sounds: Normal heart sounds.  Pulmonary:     Effort: Pulmonary effort is normal.     Breath sounds: Normal breath sounds.  Abdominal:     General: There is no distension.     Palpations: Abdomen is soft. There is no mass.     Tenderness: There is no abdominal tenderness. There is no guarding or rebound.     Hernia: No hernia is present.  Musculoskeletal:        General: Normal range of motion.     Cervical back: Normal range of motion.  Skin:    General: Skin is warm and dry.  Neurological:     Mental Status: She is oriented to person, place, and time.     GCS: GCS eye subscore is 4. GCS verbal subscore is  5. GCS motor subscore is 6.     Comments: Fluent speech, no facial droop.  Psychiatric:        Behavior: Behavior normal.     OUTCOME:                Symptoms resolved after interventions as above. I informed Dr. Chryl Heck of reaction and she agrees with plan to continue treatment today as reaction was mild. Plan for additional premeds to be added before future treatments  to include benadryl and pepcid. Pharmacy made aware of plan. Patient tolerated remainder of treatment without adverse effects.

## 2021-09-28 NOTE — Patient Instructions (Signed)
[facility]  Discharge Instructions: Thank you for choosing [facility] to provide your oncology and hematology care.   If you have a lab appointment with the [facility], please go directly to the [facility] and check in at the registration area.   Wear comfortable clothing and clothing appropriate for easy access to any Portacath or PICC line.   We strive to give you quality time with your provider. You may need to reschedule your appointment if you arrive late (15 or more minutes).  Arriving late affects you and other patients whose appointments are after yours.  Also, if you miss three or more appointments without notifying the office, you may be dismissed from the clinic at the provider's discretion.      For prescription refill requests, have your pharmacy contact our office and allow 72 hours for refills to be completed.    Today you received the following chemotherapy and/or immunotherapy agents Taxotere & Cytoxan      To help prevent nausea and vomiting after your treatment, we encourage you to take your nausea medication as directed.  BELOW ARE SYMPTOMS THAT SHOULD BE REPORTED IMMEDIATELY: *FEVER GREATER THAN 100.4 F (38 C) OR HIGHER *CHILLS OR SWEATING *NAUSEA AND VOMITING THAT IS NOT CONTROLLED WITH YOUR NAUSEA MEDICATION *UNUSUAL SHORTNESS OF BREATH *UNUSUAL BRUISING OR BLEEDING *URINARY PROBLEMS (pain or burning when urinating, or frequent urination) *BOWEL PROBLEMS (unusual diarrhea, constipation, pain near the anus) TENDERNESS IN MOUTH AND THROAT WITH OR WITHOUT PRESENCE OF ULCERS (sore throat, sores in mouth, or a toothache) UNUSUAL RASH, SWELLING OR PAIN  UNUSUAL VAGINAL DISCHARGE OR ITCHING   Items with * indicate a potential emergency and should be followed up as soon as possible or go to the Emergency Department if any problems should occur.  Please show the CHEMOTHERAPY ALERT CARD or IMMUNOTHERAPY ALERT CARD at  check-in to the Emergency Department and triage nurse.  Should you have questions after your visit or need to cancel or reschedule your appointment, please contact [facility]  Dept: [phone]  and follow the prompts.  Office hours are 8:00 a.m. to 4:30 p.m. Monday - Friday. Please note that voicemails left after 4:00 p.m. may not be returned until the following business day.  We are closed weekends and major holidays. You have access to a nurse at all times for urgent questions. Please call the main number to the clinic Dept: [phone] and follow the prompts.   For any non-urgent questions, you may also contact your provider using MyChart. We now offer e-Visits for anyone 78 and older to request care online for non-urgent symptoms. For details visit [email or web address].   Also download the MyChart app! Go to the app store, search "MyChart", open the app, select [facility], and log in with your MyChart username and password.  Masks are optional in the cancer centers. If you would like for your care team to wear a mask while they are taking care of you, please let them know. For doctor visits, patients may have with them one support person who is at least 52 years old. At this time, visitors are not allowed in the infusion area.  Docetaxel injection What is this medication? DOCETAXEL (doe se TAX el) is a chemotherapy drug. It targets fast dividing cells, like cancer cells, and causes these cells to die. This medicine is used to treat many types of cancers like breast cancer, certain stomach cancers, head and neck  cancer, lung cancer, and prostate cancer. This medicine may be used for other purposes; ask your health care provider or pharmacist if you have questions. COMMON BRAND NAME(S): Docefrez, Taxotere What should I tell my care team before I take this medication? They need to know if you have any of these conditions: infection (especially a virus  infection such as chickenpox, cold sores, or herpes) liver disease low blood counts, like low white cell, platelet, or red cell counts an unusual or allergic reaction to docetaxel, polysorbate 80, other chemotherapy agents, other medicines, foods, dyes, or preservatives pregnant or trying to get pregnant breast-feeding How should I use this medication? This drug is given as an infusion into a vein. It is administered in a hospital or clinic by a specially trained health care professional. Talk to your pediatrician regarding the use of this medicine in children. Special care may be needed. Overdosage: If you think you have taken too much of this medicine contact a poison control center or emergency room at once. NOTE: This medicine is only for you. Do not share this medicine with others. What if I miss a dose? It is important not to miss your dose. Call your doctor or health care professional if you are unable to keep an appointment. What may interact with this medication? Do not take this medicine with any of the following medications: live virus vaccines This medicine may also interact with the following medications: aprepitant certain antibiotics like erythromycin or clarithromycin certain antivirals for HIV or hepatitis certain medicines for fungal infections like fluconazole, itraconazole, ketoconazole, posaconazole, or voriconazole cimetidine ciprofloxacin conivaptan cyclosporine dronedarone fluvoxamine grapefruit juice imatinib verapamil This list may not describe all possible interactions. Give your health care provider a list of all the medicines, herbs, non-prescription drugs, or dietary supplements you use. Also tell them if you smoke, drink alcohol, or use illegal drugs. Some items may interact with your medicine. What should I watch for while using this medication? Your condition will be monitored carefully while you are receiving this medicine. You will need important  blood work done while you are taking this medicine. Call your doctor or health care professional for advice if you get a fever, chills or sore throat, or other symptoms of a cold or flu. Do not treat yourself. This drug decreases your body's ability to fight infections. Try to avoid being around people who are sick. Some products may contain alcohol. Ask your health care professional if this medicine contains alcohol. Be sure to tell all health care professionals you are taking this medicine. Certain medicines, like metronidazole and disulfiram, can cause an unpleasant reaction when taken with alcohol. The reaction includes flushing, headache, nausea, vomiting, sweating, and increased thirst. The reaction can last from 30 minutes to several hours. You may get drowsy or dizzy. Do not drive, use machinery, or do anything that needs mental alertness until you know how this medicine affects you. Do not stand or sit up quickly, especially if you are an older patient. This reduces the risk of dizzy or fainting spells. Alcohol may interfere with the effect of this medicine. Talk to your health care professional about your risk of cancer. You may be more at risk for certain types of cancer if you take this medicine. Do not become pregnant while taking this medicine or for 6 months after stopping it. Women should inform their doctor if they wish to become pregnant or think they might be pregnant. There is a potential for serious side effects  to an unborn child. Talk to your health care professional or pharmacist for more information. Do not breast-feed an infant while taking this medicine or for 1 week after stopping it. Males who get this medicine must use a condom during sex with females who can get pregnant. If you get a woman pregnant, the baby could have birth defects. The baby could die before they are born. You will need to continue wearing a condom for 3 months after stopping the medicine. Tell your health care  provider right away if your partner becomes pregnant while you are taking this medicine. This may interfere with the ability to father a child. You should talk to your doctor or health care professional if you are concerned about your fertility. What side effects may I notice from receiving this medication? Side effects that you should report to your doctor or health care professional as soon as possible: allergic reactions like skin rash, itching or hives, swelling of the face, lips, or tongue blurred vision breathing problems changes in vision low blood counts - This drug may decrease the number of white blood cells, red blood cells and platelets. You may be at increased risk for infections and bleeding. nausea and vomiting pain, redness or irritation at site where injected pain, tingling, numbness in the hands or feet redness, blistering, peeling, or loosening of the skin, including inside the mouth signs of decreased platelets or bleeding - bruising, pinpoint red spots on the skin, black, tarry stools, nosebleeds signs of decreased red blood cells - unusually weak or tired, fainting spells, lightheadedness signs of infection - fever or chills, cough, sore throat, pain or difficulty passing urine swelling of the ankle, feet, hands Side effects that usually do not require medical attention (report to your doctor or health care professional if they continue or are bothersome): constipation diarrhea fingernail or toenail changes hair loss loss of appetite mouth sores muscle pain This list may not describe all possible side effects. Call your doctor for medical advice about side effects. You may report side effects to FDA at 1-800-FDA-1088. Where should I keep my medication? This drug is given in a hospital or clinic and will not be stored at home. NOTE: This sheet is a summary. It may not cover all possible information. If you have questions about this medicine, talk to your doctor,  pharmacist, or health care provider.  2023 Elsevier/Gold Standard (2021-01-21 00:00:00)  Cyclophosphamide Injection What is this medication? CYCLOPHOSPHAMIDE (sye kloe FOSS fa mide) is a chemotherapy drug. It slows the growth of cancer cells. This medicine is used to treat many types of cancer like lymphoma, myeloma, leukemia, breast cancer, and ovarian cancer, to name a few. This medicine may be used for other purposes; ask your health care provider or pharmacist if you have questions. COMMON BRAND NAME(S): Cyclophosphamide, Cytoxan, Neosar What should I tell my care team before I take this medication? They need to know if you have any of these conditions: heart disease history of irregular heartbeat infection kidney disease liver disease low blood counts, like white cells, platelets, or red blood cells on hemodialysis recent or ongoing radiation therapy scarring or thickening of the lungs trouble passing urine an unusual or allergic reaction to cyclophosphamide, other medicines, foods, dyes, or preservatives pregnant or trying to get pregnant breast-feeding How should I use this medication? This drug is usually given as an injection into a vein or muscle or by infusion into a vein. It is administered in a hospital or clinic  by a specially trained health care professional. Talk to your pediatrician regarding the use of this medicine in children. Special care may be needed. Overdosage: If you think you have taken too much of this medicine contact a poison control center or emergency room at once. NOTE: This medicine is only for you. Do not share this medicine with others. What if I miss a dose? It is important not to miss your dose. Call your doctor or health care professional if you are unable to keep an appointment. What may interact with this medication? amphotericin B azathioprine certain antivirals for HIV or hepatitis certain medicines for blood pressure, heart disease,  irregular heart beat certain medicines that treat or prevent blood clots like warfarin certain other medicines for cancer cyclosporine etanercept indomethacin medicines that relax muscles for surgery medicines to increase blood counts metronidazole This list may not describe all possible interactions. Give your health care provider a list of all the medicines, herbs, non-prescription drugs, or dietary supplements you use. Also tell them if you smoke, drink alcohol, or use illegal drugs. Some items may interact with your medicine. What should I watch for while using this medication? Your condition will be monitored carefully while you are receiving this medicine. You may need blood work done while you are taking this medicine. Drink water or other fluids as directed. Urinate often, even at night. Some products may contain alcohol. Ask your health care professional if this medicine contains alcohol. Be sure to tell all health care professionals you are taking this medicine. Certain medicines, like metronidazole and disulfiram, can cause an unpleasant reaction when taken with alcohol. The reaction includes flushing, headache, nausea, vomiting, sweating, and increased thirst. The reaction can last from 30 minutes to several hours. Do not become pregnant while taking this medicine or for 1 year after stopping it. Women should inform their health care professional if they wish to become pregnant or think they might be pregnant. Men should not father a child while taking this medicine and for 4 months after stopping it. There is potential for serious side effects to an unborn child. Talk to your health care professional for more information. Do not breast-feed an infant while taking this medicine or for 1 week after stopping it. This medicine has caused ovarian failure in some women. This medicine may make it more difficult to get pregnant. Talk to your health care professional if you are concerned about  your fertility. This medicine has caused decreased sperm counts in some men. This may make it more difficult to father a child. Talk to your health care professional if you are concerned about your fertility. Call your health care professional for advice if you get a fever, chills, or sore throat, or other symptoms of a cold or flu. Do not treat yourself. This medicine decreases your body's ability to fight infections. Try to avoid being around people who are sick. Avoid taking medicines that contain aspirin, acetaminophen, ibuprofen, naproxen, or ketoprofen unless instructed by your health care professional. These medicines may hide a fever. Talk to your health care professional about your risk of cancer. You may be more at risk for certain types of cancer if you take this medicine. If you are going to need surgery or other procedure, tell your health care professional that you are using this medicine. Be careful brushing or flossing your teeth or using a toothpick because you may get an infection or bleed more easily. If you have any dental work done, tell  your dentist you are receiving this medicine. What side effects may I notice from receiving this medication? Side effects that you should report to your doctor or health care professional as soon as possible: allergic reactions like skin rash, itching or hives, swelling of the face, lips, or tongue breathing problems nausea, vomiting signs and symptoms of bleeding such as bloody or black, tarry stools; red or dark brown urine; spitting up blood or brown material that looks like coffee grounds; red spots on the skin; unusual bruising or bleeding from the eyes, gums, or nose signs and symptoms of heart failure like fast, irregular heartbeat, sudden weight gain; swelling of the ankles, feet, hands signs and symptoms of infection like fever; chills; cough; sore throat; pain or trouble passing urine signs and symptoms of kidney injury like trouble  passing urine or change in the amount of urine signs and symptoms of liver injury like dark yellow or brown urine; general ill feeling or flu-like symptoms; light-colored stools; loss of appetite; nausea; right upper belly pain; unusually weak or tired; yellowing of the eyes or skin Side effects that usually do not require medical attention (report to your doctor or health care professional if they continue or are bothersome): confusion decreased hearing diarrhea facial flushing hair loss headache loss of appetite missed menstrual periods signs and symptoms of low red blood cells or anemia such as unusually weak or tired; feeling faint or lightheaded; falls skin discoloration This list may not describe all possible side effects. Call your doctor for medical advice about side effects. You may report side effects to FDA at 1-800-FDA-1088. Where should I keep my medication? This drug is given in a hospital or clinic and will not be stored at home. NOTE: This sheet is a summary. It may not cover all possible information. If you have questions about this medicine, talk to your doctor, pharmacist, or health care provider.  2023 Elsevier/Gold Standard (2021-01-21 00:00:00)

## 2021-09-28 NOTE — Progress Notes (Signed)
Patient stated at 1352 she was feeling a burning sensation in her central sternal area. Pain rated as a 5 out of 10. Patient denied SOB, difficulty breathing. Infusion was paused. Burning sensation improved immediately. VSS see flowsheet. Infusion restarted at 1354. Pain returned and infusion stopped again. Patient stated she was beginning to feel some stomach pain, denied calling it nausea.   Anda Kraft, [facility]) called.   Pepcid started at 1405.   1411: Patient reported the stomach feels "funny". She rates this pain as a 3 out of 10.   Report given to Bethena Roys as primary RN.

## 2021-09-29 ENCOUNTER — Other Ambulatory Visit (HOSPITAL_COMMUNITY): Payer: Self-pay

## 2021-09-29 ENCOUNTER — Other Ambulatory Visit (HOSPITAL_BASED_OUTPATIENT_CLINIC_OR_DEPARTMENT_OTHER): Payer: Self-pay

## 2021-09-29 ENCOUNTER — Telehealth: Payer: Self-pay | Admitting: *Deleted

## 2021-09-29 ENCOUNTER — Ambulatory Visit: Payer: 59

## 2021-09-29 NOTE — Telephone Encounter (Signed)
Per issues with 1st chemo yesterday - RN called pt this morning- she states she is overall good with mild chest discomfort-no cardiac symptoms per inquiry . she took 2 tylenol-   She denies any nausea - just the mid chest discomfort.  She has a headache.  Discussed symptoms occurring may be more related to the premeds ( steroid and aloxi ). Per discussion - note pt has 2 more days of decadron therapy -pt is not on any PPI - but has omeprazole in the home at 20 mg tabs.  This RN recommended to the patient to initiate the omeprazole twice a day and that she may find benadryl more effective for the headache - reviewed possible drowsiness with use.  Reviewed also goal presently is to eat small amounts of non spicy foods with main goal of intake of fluids.  Pt verbalized understanding of plan - as well as to call if needed - she is also scheduled for injection tomorrow .

## 2021-09-30 ENCOUNTER — Inpatient Hospital Stay: Payer: 59

## 2021-09-30 ENCOUNTER — Other Ambulatory Visit: Payer: Self-pay

## 2021-09-30 NOTE — Progress Notes (Signed)
Pt arrived for injection appt. Pt stated her symptoms from the ON-PRO injection was causing joint pain. Advised pt to continue taking Tylenol as well as Claritin and use of heating pad on low setting. Pt verbalized understanding.

## 2021-10-02 ENCOUNTER — Other Ambulatory Visit: Payer: Self-pay

## 2021-10-02 ENCOUNTER — Encounter (HOSPITAL_COMMUNITY): Payer: Self-pay

## 2021-10-02 ENCOUNTER — Emergency Department (HOSPITAL_COMMUNITY): Payer: 59

## 2021-10-02 ENCOUNTER — Inpatient Hospital Stay (HOSPITAL_COMMUNITY)
Admission: EM | Admit: 2021-10-02 | Discharge: 2021-10-05 | DRG: 392 | Disposition: A | Discharge status: Home / Self Care | Payer: 59 | Attending: Internal Medicine | Admitting: Internal Medicine

## 2021-10-02 DIAGNOSIS — Z79899 Other long term (current) drug therapy: Secondary | ICD-10-CM

## 2021-10-02 DIAGNOSIS — R7881 Bacteremia: Secondary | ICD-10-CM | POA: Diagnosis present

## 2021-10-02 DIAGNOSIS — Z91013 Allergy to seafood: Secondary | ICD-10-CM

## 2021-10-02 DIAGNOSIS — R55 Syncope and collapse: Secondary | ICD-10-CM | POA: Diagnosis present

## 2021-10-02 DIAGNOSIS — Z713 Dietary counseling and surveillance: Secondary | ICD-10-CM

## 2021-10-02 DIAGNOSIS — Z9221 Personal history of antineoplastic chemotherapy: Secondary | ICD-10-CM

## 2021-10-02 DIAGNOSIS — Z91018 Allergy to other foods: Secondary | ICD-10-CM

## 2021-10-02 DIAGNOSIS — Z90711 Acquired absence of uterus with remaining cervical stump: Secondary | ICD-10-CM

## 2021-10-02 DIAGNOSIS — K5900 Constipation, unspecified: Secondary | ICD-10-CM | POA: Diagnosis not present

## 2021-10-02 DIAGNOSIS — Z888 Allergy status to other drugs, medicaments and biological substances status: Secondary | ICD-10-CM

## 2021-10-02 DIAGNOSIS — G8929 Other chronic pain: Secondary | ICD-10-CM | POA: Diagnosis present

## 2021-10-02 DIAGNOSIS — C50412 Malignant neoplasm of upper-outer quadrant of left female breast: Secondary | ICD-10-CM | POA: Diagnosis present

## 2021-10-02 DIAGNOSIS — M549 Dorsalgia, unspecified: Secondary | ICD-10-CM | POA: Diagnosis present

## 2021-10-02 DIAGNOSIS — F419 Anxiety disorder, unspecified: Secondary | ICD-10-CM | POA: Diagnosis present

## 2021-10-02 DIAGNOSIS — E669 Obesity, unspecified: Secondary | ICD-10-CM | POA: Diagnosis present

## 2021-10-02 DIAGNOSIS — Z803 Family history of malignant neoplasm of breast: Secondary | ICD-10-CM

## 2021-10-02 DIAGNOSIS — Z170 Estrogen receptor positive status [ER+]: Secondary | ICD-10-CM

## 2021-10-02 DIAGNOSIS — Z20822 Contact with and (suspected) exposure to covid-19: Secondary | ICD-10-CM | POA: Diagnosis present

## 2021-10-02 DIAGNOSIS — D849 Immunodeficiency, unspecified: Secondary | ICD-10-CM | POA: Diagnosis present

## 2021-10-02 DIAGNOSIS — D61818 Other pancytopenia: Secondary | ICD-10-CM | POA: Diagnosis present

## 2021-10-02 DIAGNOSIS — L409 Psoriasis, unspecified: Secondary | ICD-10-CM | POA: Diagnosis present

## 2021-10-02 DIAGNOSIS — E876 Hypokalemia: Secondary | ICD-10-CM | POA: Diagnosis present

## 2021-10-02 DIAGNOSIS — F329 Major depressive disorder, single episode, unspecified: Secondary | ICD-10-CM | POA: Diagnosis present

## 2021-10-02 DIAGNOSIS — K219 Gastro-esophageal reflux disease without esophagitis: Secondary | ICD-10-CM | POA: Diagnosis present

## 2021-10-02 DIAGNOSIS — J4520 Mild intermittent asthma, uncomplicated: Secondary | ICD-10-CM | POA: Diagnosis present

## 2021-10-02 DIAGNOSIS — Z91040 Latex allergy status: Secondary | ICD-10-CM

## 2021-10-02 DIAGNOSIS — Z6831 Body mass index (BMI) 31.0-31.9, adult: Secondary | ICD-10-CM

## 2021-10-02 LAB — I-STAT CHEM 8, ED
BUN: 3 mg/dL — ABNORMAL LOW (ref 6–20)
Calcium, Ion: 1.17 mmol/L (ref 1.15–1.40)
Chloride: 101 mmol/L (ref 98–111)
Creatinine, Ser: 0.5 mg/dL (ref 0.44–1.00)
Glucose, Bld: 98 mg/dL (ref 70–99)
HCT: 35 % — ABNORMAL LOW (ref 36.0–46.0)
Hemoglobin: 11.9 g/dL — ABNORMAL LOW (ref 12.0–15.0)
Potassium: 3.3 mmol/L — ABNORMAL LOW (ref 3.5–5.1)
Sodium: 139 mmol/L (ref 135–145)
TCO2: 24 mmol/L (ref 22–32)

## 2021-10-02 LAB — LACTIC ACID, PLASMA
Lactic Acid, Venous: 1.2 mmol/L (ref 0.5–1.9)
Lactic Acid, Venous: 1.1 mmol/L (ref 0.5–1.9)

## 2021-10-02 LAB — LIPASE, BLOOD: Lipase: 24 U/L (ref 11–51)

## 2021-10-02 LAB — CBC WITH DIFFERENTIAL/PLATELET
Abs Immature Granulocytes: 0.51 10*3/uL — ABNORMAL HIGH (ref 0.00–0.07)
Basophils Absolute: 0.1 10*3/uL (ref 0.0–0.1)
Basophils Relative: 2 %
Eosinophils Absolute: 0.1 10*3/uL (ref 0.0–0.5)
Eosinophils Relative: 2 %
HCT: 36.4 % (ref 36.0–46.0)
Hemoglobin: 12.7 g/dL (ref 12.0–15.0)
Immature Granulocytes: 12 %
Lymphocytes Relative: 15 %
Lymphs Abs: 0.6 10*3/uL — ABNORMAL LOW (ref 0.7–4.0)
MCH: 31.2 pg (ref 26.0–34.0)
MCHC: 34.9 g/dL (ref 30.0–36.0)
MCV: 89.4 fL (ref 80.0–100.0)
Monocytes Absolute: 0.2 10*3/uL (ref 0.1–1.0)
Monocytes Relative: 4 %
Neutro Abs: 2.8 10*3/uL (ref 1.7–7.7)
Neutrophils Relative %: 65 %
Platelets: 122 10*3/uL — ABNORMAL LOW (ref 150–400)
RBC: 4.07 MIL/uL (ref 3.87–5.11)
RDW: 12.9 % (ref 11.5–15.5)
WBC: 4.3 10*3/uL (ref 4.0–10.5)
nRBC: 0 % (ref 0.0–0.2)

## 2021-10-02 LAB — COMPREHENSIVE METABOLIC PANEL
ALT: 21 U/L (ref 0–44)
AST: 17 U/L (ref 15–41)
Albumin: 3.8 g/dL (ref 3.5–5.0)
Alkaline Phosphatase: 51 U/L (ref 38–126)
Anion gap: 9 (ref 5–15)
BUN: 6 mg/dL (ref 6–20)
CO2: 23 mmol/L (ref 22–32)
Calcium: 8.9 mg/dL (ref 8.9–10.3)
Chloride: 108 mmol/L (ref 98–111)
Creatinine, Ser: 0.49 mg/dL (ref 0.44–1.00)
GFR, Estimated: >60 mL/min (ref >60)
Glucose, Bld: 96 mg/dL (ref 70–99)
Potassium: 3.3 mmol/L — ABNORMAL LOW (ref 3.5–5.1)
Sodium: 140 mmol/L (ref 135–145)
Total Bilirubin: 1.5 mg/dL — ABNORMAL HIGH (ref 0.3–1.2)
Total Protein: 6.6 g/dL (ref 6.5–8.1)

## 2021-10-02 LAB — URINALYSIS, ROUTINE W REFLEX MICROSCOPIC
Bacteria, UA: NONE SEEN
Bilirubin Urine: NEGATIVE
Glucose, UA: NEGATIVE mg/dL
Ketones, ur: NEGATIVE mg/dL
Leukocytes,Ua: NEGATIVE
Nitrite: NEGATIVE
Protein, ur: NEGATIVE mg/dL
Specific Gravity, Urine: 1.002 — ABNORMAL LOW (ref 1.005–1.030)
pH: 7 (ref 5.0–8.0)

## 2021-10-02 LAB — RESP PANEL BY RT-PCR (FLU A&B, COVID) ARPGX2
Influenza A by PCR: NEGATIVE
Influenza B by PCR: NEGATIVE
SARS Coronavirus 2 by RT PCR: NEGATIVE

## 2021-10-02 LAB — TROPONIN I (HIGH SENSITIVITY)
Troponin I (High Sensitivity): 2 ng/L (ref <18)
Troponin I (High Sensitivity): 2 ng/L (ref <18)

## 2021-10-02 MED ORDER — LIDOCAINE HCL URETHRAL/MUCOSAL 2 % EX GEL
1.0000 | Freq: Once | CUTANEOUS | Status: AC
  Administered 2021-10-02: 1 via TOPICAL
  Filled 2021-10-02: qty 11

## 2021-10-02 MED ORDER — LACTATED RINGERS IV BOLUS
1000.0000 mL | Freq: Once | INTRAVENOUS | Status: AC
  Administered 2021-10-02: 1000 mL via INTRAVENOUS

## 2021-10-02 MED ORDER — DOCUSATE SODIUM 100 MG PO CAPS: 100.0000 mg | ORAL_CAPSULE | Freq: Two times a day (BID) | ORAL | 0 refills | Status: DC

## 2021-10-02 MED ORDER — SORBITOL 70 % SOLN
960.0000 mL | TOPICAL_OIL | Freq: Once | ORAL | Status: DC
  Filled 2021-10-02: qty 473

## 2021-10-02 MED ORDER — FENTANYL CITRATE PF 50 MCG/ML IJ SOSY
50.0000 ug | PREFILLED_SYRINGE | Freq: Once | INTRAMUSCULAR | Status: AC
  Administered 2021-10-02: 50 ug via INTRAVENOUS
  Filled 2021-10-02: qty 1

## 2021-10-02 MED ORDER — IOHEXOL 350 MG/ML SOLN
100.0000 mL | Freq: Once | INTRAVENOUS | Status: AC | PRN
Start: 2021-10-02 — End: 2021-10-02
  Administered 2021-10-02: 100 mL via INTRAVENOUS

## 2021-10-02 MED ORDER — POTASSIUM CHLORIDE CRYS ER 20 MEQ PO TBCR
40.0000 meq | EXTENDED_RELEASE_TABLET | Freq: Once | ORAL | Status: AC
  Administered 2021-10-02: 40 meq via ORAL
  Filled 2021-10-02: qty 2

## 2021-10-02 MED ORDER — SODIUM CHLORIDE (PF) 0.9 % IJ SOLN
INTRAMUSCULAR | Status: AC
  Filled 2021-10-02: qty 50

## 2021-10-02 MED ORDER — POLYETHYLENE GLYCOL 3350 17 G PO PACK: 17.0000 g | PACK | Freq: Every day | ORAL | 0 refills | Status: DC

## 2021-10-02 MED ORDER — MINERAL OIL RE ENEM
1.0000 | ENEMA | Freq: Once | RECTAL | Status: AC
  Administered 2021-10-03: 1 via RECTAL
  Filled 2021-10-02: qty 1

## 2021-10-02 NOTE — Discharge Instructions (Signed)
If you develop worsening, continued, or recurrent abdominal pain, uncontrolled vomiting, fever, chest or back pain, or any other new/concerning symptoms then return to the ER for evaluation.  

## 2021-10-02 NOTE — ED Notes (Signed)
Patient c/o worsening pain after getting the fentanyl, MD aware

## 2021-10-02 NOTE — ED Triage Notes (Signed)
T had first chemotherapy treatment this Wednesday for stage 4 breast cancer. Patient c/o being constipated, have lower abdominal pain and syncopal episode this after while in shower.

## 2021-10-02 NOTE — ED Provider Notes (Signed)
[facility] DEPT Provider Note   CSN: [id number] Arrival date & time: 10/02/21  1642     History  Chief Complaint  Patient presents with   Abdominal Pain   Near Syncope   Weakness    Darlene Peters is a 51 y.o. female.  HPI 51 year old female with a history of metastatic breast cancer currently on chemotherapy presents with syncope.  She has been feeling "bone pain" since her last chemotherapy on 7/25.  Today she has been feeling some palpitations.  She felt lightheaded as she was going into the shower and then suddenly passed out.  Think she might of injured her plantar left foot though it is pretty mild and does not feel like there is a significant injury.  She has been able to walk on it.  She tells me that she has been having chest pain in the middle of her chest all day that feels dull.  She also been having abdominal pain since she woke up in her lower abdomen and has not been able to have a bowel movement in 2 days.  Normally has about 3 BMs a day.  She denies any specific dysuria.  She has a chronic cough.  She tells me she had a temperature of 101 this morning.  She has been taking gabapentin and Tylenol for pain.  She has chronic back pain but no new back pain.  Home Medications Prior to Admission medications   Medication Sig Start Date End Date Taking? Authorizing Provider  Acetaminophen (TYLENOL 8 HOUR PO) Take 1 tablet by mouth every 6 (six) hours as needed (pain).   Yes [provider]  albuterol (PROVENTIL HFA) 108 (90 Base) MCG/ACT inhaler Inhale 2 puffs into the lungs every 4 (four) hours as needed for wheezing or shortness of breath. 08/09/21  Yes Valentina Shaggy, MD  buPROPion (WELLBUTRIN XL) 150 MG 24 hr tablet Take one tablet by mouth in the morning along with the '300mg'$  dose. 09/16/21  Yes   buPROPion (WELLBUTRIN XL) 300 MG 24 hr tablet Take 1 tablet  by mouth daily. 09/16/21  Yes   clobetasol (TEMOVATE) 0.05 % external  solution Apply topically to affected areas twice daily as needed (not to face,groin,or axilla) 07/27/21  Yes Allyn Kenner, MD  desonide (DESOWEN) 0.05 % lotion Apply topically to affected areas twice daily as needed (not to face,groin,or axilla) 07/27/21  Yes Allyn Kenner, MD  dexamethasone (DECADRON) 4 MG tablet Take 2 tablets (8 mg total) by mouth 2 (two) times daily. Start the day before Taxotere. Then again the day after chemo for 3 days. 09/19/21  Yes Benay Pike, MD  docusate sodium (COLACE) 100 MG capsule Take 1 capsule (100 mg total) by mouth every 12 (twelve) hours. 10/02/21  Yes Sherwood Gambler, MD  gabapentin (NEURONTIN) 100 MG capsule Take 1 capsule (100 mg total) by mouth 3 (three) times daily. 08/03/21  Yes Autumn Messing III, MD  levocetirizine (XYZAL) 5 MG tablet Take 1 tablet (5 mg total) by mouth every evening. 08/09/21  Yes Valentina Shaggy, MD  lidocaine-prilocaine (EMLA) cream Apply to affected area once. 09/14/21  Yes Iruku, Arletha Pili, MD  ondansetron (ZOFRAN) 8 MG tablet Take 1 tablet (8 mg total) by mouth 2 (two) times daily as needed for refractory nausea / vomiting. Start on day 3 after chemo. 09/19/21  Yes Iruku, Arletha Pili, MD  polyethylene glycol (MIRALAX / GLYCOLAX) 17 g packet Take 17 g by mouth daily. 10/02/21  Yes Sherwood Gambler,  MD  prochlorperazine (COMPAZINE) 10 MG tablet Take 1 tablet (10 mg total) by mouth every 6 (six) hours as needed for nausea or vomiting. 09/19/21  Yes Benay Pike, MD  triamcinolone cream (KENALOG) 0.1 % Apply topically to affected areas twice daily as needed (not to face,groin,or axilla) Patient not taking: Reported on 10/02/2021 07/27/21   Allyn Kenner, MD      Allergies    Shellfish allergy, Fish allergy, Gluten meal, Latex, and Docetaxel    Review of Systems   Review of Systems  Constitutional:  Positive for fever.  Respiratory:  Positive for cough. Negative for shortness of breath.   Cardiovascular:  Positive for chest pain.  Gastrointestinal:   Positive for abdominal pain and constipation.  Genitourinary:  Negative for dysuria.  Musculoskeletal:  Positive for back pain.  Neurological:  Positive for syncope and light-headedness.    Physical Exam Updated Vital Signs BP 123/78   Pulse 91   Temp 98 F (36.7 C)   Resp 19   Ht '5\' 8"'$  (1.727 m)   Wt 92.5 kg   LMP  (LMP Unknown)   SpO2 99%   BMI 31.01 kg/m  Physical Exam Vitals and nursing note reviewed.  Constitutional:      Appearance: She is well-developed.  HENT:     Head: Normocephalic and atraumatic.  Cardiovascular:     Rate and Rhythm: Regular rhythm. Tachycardia present.     Heart sounds: Normal heart sounds.  Pulmonary:     Effort: Pulmonary effort is normal.     Breath sounds: Normal breath sounds. No wheezing, rhonchi or rales.  Abdominal:     Palpations: Abdomen is soft.     Tenderness: There is abdominal tenderness in the right lower quadrant, suprapubic area and left lower quadrant.  Musculoskeletal:     Left foot: Normal range of motion. No swelling, tenderness or bony tenderness.  Skin:    General: Skin is warm and dry.  Neurological:     Mental Status: She is alert.     ED Results / Procedures / Treatments   Labs (all labs ordered are listed, but only abnormal results are displayed) Labs Reviewed  COMPREHENSIVE METABOLIC PANEL - Abnormal; Notable for the following components:      Result Value   Potassium 3.3 (*)    Total Bilirubin 1.5 (*)    All other components within normal limits  CBC WITH DIFFERENTIAL/PLATELET - Abnormal; Notable for the following components:   Platelets 122 (*)    Lymphs Abs 0.6 (*)    Abs Immature Granulocytes 0.51 (*)    All other components within normal limits  URINALYSIS, ROUTINE W REFLEX MICROSCOPIC - Abnormal; Notable for the following components:   Color, Urine STRAW (*)    Specific Gravity, Urine 1.002 (*)    Hgb urine dipstick SMALL (*)    All other components within normal limits  I-STAT CHEM 8, ED -  Abnormal; Notable for the following components:   Potassium 3.3 (*)    BUN 3 (*)    Hemoglobin 11.9 (*)    HCT 35.0 (*)    All other components within normal limits  RESP PANEL BY RT-PCR (FLU A&B, COVID) ARPGX2  CULTURE, BLOOD (ROUTINE X 2)  CULTURE, BLOOD (ROUTINE X 2)  LACTIC ACID, PLASMA  LACTIC ACID, PLASMA  LIPASE, BLOOD  TROPONIN I (HIGH SENSITIVITY)  TROPONIN I (HIGH SENSITIVITY)    EKG EKG Interpretation  Date/Time:  Sunday October 02 2021 17:32:34 EDT Ventricular Rate:  87  PR Interval:  169 QRS Duration: 82 QT Interval:  355 QTC Calculation: 427 R Axis:   143 Text Interpretation: Right and left arm electrode reversal needs repeat Confirmed by Sherwood Gambler [phone]) on 10/02/2021 5:47:33 PM  Radiology CT Angio Chest PE W and/or Wo Contrast  Result Date: 10/02/2021 CLINICAL DATA:  Near syncopal episode. High probability for pulmonary embolism. Sepsis. Suspected bowel obstruction. EXAM: CT ANGIOGRAPHY CHEST CT ABDOMEN AND PELVIS WITH CONTRAST TECHNIQUE: Multidetector CT imaging of the chest was performed using the standard protocol during bolus administration of intravenous contrast. Multiplanar CT image reconstructions and MIPs were obtained to evaluate the vascular anatomy. Multidetector CT imaging of the abdomen and pelvis was performed using the standard protocol during bolus administration of intravenous contrast. RADIATION DOSE REDUCTION: This exam was performed according to the departmental dose-optimization program which includes automated exposure control, adjustment of the mA and/or kV according to patient size and/or use of iterative reconstruction technique. CONTRAST:  164m OMNIPAQUE IOHEXOL 350 MG/ML SOLN COMPARISON:  None Available. FINDINGS: CTA CHEST FINDINGS Cardiovascular: Satisfactory opacification of pulmonary arteries noted, and no pulmonary emboli identified. No evidence of thoracic aortic dissection or aneurysm. Mediastinum/Nodes: No masses or pathologically  enlarged lymph nodes identified. Lungs/Pleura: No pulmonary mass, infiltrate, or effusion. Upper abdomen: No acute findings. Musculoskeletal: No suspicious bone lesions identified. Review of the MIP images confirms the above findings. CT ABDOMEN and PELVIS FINDINGS Lower Chest: No acute findings. Hepatobiliary: No hepatic masses identified.Multiple small cysts are seen in both right and left lobes. Gallbladder is unremarkable. No evidence of biliary ductal dilatation. Pancreas:  No mass or inflammatory changes. Spleen: Within normal limits in size and appearance. Adrenals/Urinary Tract: No masses identified. Tiny right renal cyst noted no evidence of ureteral calculi or hydronephrosis. Urinary bladder is mildly distended but otherwise unremarkable. Stomach/Bowel: No evidence of obstruction, inflammatory process or abnormal fluid collections. Normal appendix visualized. Vascular/Lymphatic: No pathologically enlarged lymph nodes. No acute vascular findings. Reproductive: Prior hysterectomy noted. Adnexal regions are unremarkable in appearance. Other:  None. Musculoskeletal:  No suspicious bone lesions identified. Review of the MIP images confirms the above findings. IMPRESSION: No evidence of pulmonary embolism or other active disease within the thorax. No acute findings within the abdomen or pelvis. No evidence of bowel obstruction or acute inflammatory process Mildly distended urinary bladder; recommend clinical correlation to exclude possibility of urinary retention. Electronically Signed   By: JMarlaine HindM.D.   On: 10/02/2021 19:54   CT ABDOMEN PELVIS W CONTRAST  Result Date: 10/02/2021 CLINICAL DATA:  Near syncopal episode. High probability for pulmonary embolism. Sepsis. Suspected bowel obstruction. EXAM: CT ANGIOGRAPHY CHEST CT ABDOMEN AND PELVIS WITH CONTRAST TECHNIQUE: Multidetector CT imaging of the chest was performed using the standard protocol during bolus administration of intravenous contrast.  Multiplanar CT image reconstructions and MIPs were obtained to evaluate the vascular anatomy. Multidetector CT imaging of the abdomen and pelvis was performed using the standard protocol during bolus administration of intravenous contrast. RADIATION DOSE REDUCTION: This exam was performed according to the departmental dose-optimization program which includes automated exposure control, adjustment of the mA and/or kV according to patient size and/or use of iterative reconstruction technique. CONTRAST:  1031mOMNIPAQUE IOHEXOL 350 MG/ML SOLN COMPARISON:  None Available. FINDINGS: CTA CHEST FINDINGS Cardiovascular: Satisfactory opacification of pulmonary arteries noted, and no pulmonary emboli identified. No evidence of thoracic aortic dissection or aneurysm. Mediastinum/Nodes: No masses or pathologically enlarged lymph nodes identified. Lungs/Pleura: No pulmonary mass, infiltrate, or effusion. Upper abdomen: No acute findings.  Musculoskeletal: No suspicious bone lesions identified. Review of the MIP images confirms the above findings. CT ABDOMEN and PELVIS FINDINGS Lower Chest: No acute findings. Hepatobiliary: No hepatic masses identified.Multiple small cysts are seen in both right and left lobes. Gallbladder is unremarkable. No evidence of biliary ductal dilatation. Pancreas:  No mass or inflammatory changes. Spleen: Within normal limits in size and appearance. Adrenals/Urinary Tract: No masses identified. Tiny right renal cyst noted no evidence of ureteral calculi or hydronephrosis. Urinary bladder is mildly distended but otherwise unremarkable. Stomach/Bowel: No evidence of obstruction, inflammatory process or abnormal fluid collections. Normal appendix visualized. Vascular/Lymphatic: No pathologically enlarged lymph nodes. No acute vascular findings. Reproductive: Prior hysterectomy noted. Adnexal regions are unremarkable in appearance. Other:  None. Musculoskeletal:  No suspicious bone lesions identified. Review  of the MIP images confirms the above findings. IMPRESSION: No evidence of pulmonary embolism or other active disease within the thorax. No acute findings within the abdomen or pelvis. No evidence of bowel obstruction or acute inflammatory process Mildly distended urinary bladder; recommend clinical correlation to exclude possibility of urinary retention. Electronically Signed   By: Marlaine Hind M.D.   On: 10/02/2021 19:54   DG Chest Portable 1 View  Result Date: 10/02/2021 CLINICAL DATA:  Fever and chest pain. EXAM: PORTABLE CHEST 1 VIEW COMPARISON:  Chest x-ray 10/04/2015 FINDINGS: Right chest port catheter tip projects over the SVC. There are left axillary surgical clips. The heart size and mediastinal contours are within normal limits. Both lungs are clear. The visualized skeletal structures are unremarkable. IMPRESSION: No active disease. Electronically Signed   By: Ronney Asters M.D.   On: 10/02/2021 17:54    Procedures Procedures    Medications Ordered in ED Medications  sodium chloride (PF) 0.9 % injection (  Not Given 10/02/21 1922)  sorbitol, milk of mag, mineral oil, glycerin (SMOG) enema (has no administration in time range)  mineral oil enema 1 enema (has no administration in time range)  lactated ringers bolus 1,000 mL (0 mLs Intravenous Stopped 10/02/21 1953)  fentaNYL (SUBLIMAZE) injection 50 mcg (50 mcg Intravenous Given 10/02/21 1814)  iohexol (OMNIPAQUE) 350 MG/ML injection 100 mL (100 mLs Intravenous Contrast Given 10/02/21 1838)  fentaNYL (SUBLIMAZE) injection 50 mcg (50 mcg Intravenous Given 10/02/21 1916)  lidocaine (XYLOCAINE) 2 % jelly 1 Application (1 Application Topical Given 10/02/21 1952)  potassium chloride SA (KLOR-CON M) CR tablet 40 mEq (40 mEq Oral Given 10/02/21 2127)    ED Course/ Medical Decision Making/ A&P                           Medical Decision Making Amount and/or Complexity of Data Reviewed Labs: ordered.    Details: Urinalysis negative.  Mild  hypokalemia.  Normal lactate, troponin, and WBC.  No neutropenia. Radiology: ordered and independent interpretation performed.    Details: No pneumonia on x-ray and no PE or acute abdominal emergency on CTs.  Significant constipation noted ECG/medicine tests: ordered and independent interpretation performed.    Details: No acute ischemia.  Repeat EKG after arm lead correction is normal.  Risk OTC drugs. Prescription drug management.   Overall, seems that most of her abdominal pain is coming from constipation.  With the nurse I did a rectal exam and it is difficult to get all the way up to the stool but there is definitely some stool though it is fairly soft.  With her discomfort, I think she will need to have an enema.  She would like to wait on this prior to discharge.  I offered to admit given her report of palpitations and syncope though she tells me the palpitations are a longstanding problem and does not want to stay.  Do not think that is totally unreasonable but states she needs a follow-up closely.  She endorses a fever of 101 at home.  Discussed with Dr. Lindi Adie, given the one-time episode and blood cultures obtained, he advises no further treatment for now but following up closely with oncology.  Care to Dr. Ayesha Rumpf with enema pending.        Final Clinical Impression(s) / ED Diagnoses Final diagnoses:  Syncope and collapse  Constipation, unspecified constipation type    Rx / DC Orders ED Discharge Orders          Ordered    polyethylene glycol (MIRALAX / GLYCOLAX) 17 g packet  Daily        10/02/21 2324    docusate sodium (COLACE) 100 MG capsule  Every 12 hours        10/02/21 2324              Sherwood Gambler, MD 10/02/21 2335

## 2021-10-03 DIAGNOSIS — Z79899 Other long term (current) drug therapy: Secondary | ICD-10-CM | POA: Diagnosis not present

## 2021-10-03 DIAGNOSIS — Z91013 Allergy to seafood: Secondary | ICD-10-CM | POA: Diagnosis not present

## 2021-10-03 DIAGNOSIS — Z170 Estrogen receptor positive status [ER+]: Secondary | ICD-10-CM | POA: Diagnosis not present

## 2021-10-03 DIAGNOSIS — Z20822 Contact with and (suspected) exposure to covid-19: Secondary | ICD-10-CM | POA: Diagnosis present

## 2021-10-03 DIAGNOSIS — E669 Obesity, unspecified: Secondary | ICD-10-CM | POA: Diagnosis present

## 2021-10-03 DIAGNOSIS — C50412 Malignant neoplasm of upper-outer quadrant of left female breast: Secondary | ICD-10-CM | POA: Diagnosis present

## 2021-10-03 DIAGNOSIS — D61818 Other pancytopenia: Secondary | ICD-10-CM | POA: Diagnosis present

## 2021-10-03 DIAGNOSIS — D849 Immunodeficiency, unspecified: Secondary | ICD-10-CM | POA: Diagnosis present

## 2021-10-03 DIAGNOSIS — Z803 Family history of malignant neoplasm of breast: Secondary | ICD-10-CM | POA: Diagnosis not present

## 2021-10-03 DIAGNOSIS — K5900 Constipation, unspecified: Secondary | ICD-10-CM | POA: Diagnosis present

## 2021-10-03 DIAGNOSIS — Z90711 Acquired absence of uterus with remaining cervical stump: Secondary | ICD-10-CM | POA: Diagnosis not present

## 2021-10-03 DIAGNOSIS — Z6831 Body mass index (BMI) 31.0-31.9, adult: Secondary | ICD-10-CM | POA: Diagnosis not present

## 2021-10-03 DIAGNOSIS — E876 Hypokalemia: Secondary | ICD-10-CM | POA: Diagnosis present

## 2021-10-03 DIAGNOSIS — Z91018 Allergy to other foods: Secondary | ICD-10-CM | POA: Diagnosis not present

## 2021-10-03 DIAGNOSIS — B9689 Other specified bacterial agents as the cause of diseases classified elsewhere: Secondary | ICD-10-CM | POA: Diagnosis not present

## 2021-10-03 DIAGNOSIS — R55 Syncope and collapse: Secondary | ICD-10-CM | POA: Diagnosis present

## 2021-10-03 DIAGNOSIS — Z9221 Personal history of antineoplastic chemotherapy: Secondary | ICD-10-CM | POA: Diagnosis not present

## 2021-10-03 DIAGNOSIS — Z91040 Latex allergy status: Secondary | ICD-10-CM | POA: Diagnosis not present

## 2021-10-03 DIAGNOSIS — K219 Gastro-esophageal reflux disease without esophagitis: Secondary | ICD-10-CM | POA: Diagnosis present

## 2021-10-03 DIAGNOSIS — F329 Major depressive disorder, single episode, unspecified: Secondary | ICD-10-CM | POA: Diagnosis present

## 2021-10-03 DIAGNOSIS — Z888 Allergy status to other drugs, medicaments and biological substances status: Secondary | ICD-10-CM | POA: Diagnosis not present

## 2021-10-03 DIAGNOSIS — R7881 Bacteremia: Secondary | ICD-10-CM

## 2021-10-03 DIAGNOSIS — M549 Dorsalgia, unspecified: Secondary | ICD-10-CM | POA: Diagnosis present

## 2021-10-03 DIAGNOSIS — F419 Anxiety disorder, unspecified: Secondary | ICD-10-CM | POA: Diagnosis present

## 2021-10-03 DIAGNOSIS — G8929 Other chronic pain: Secondary | ICD-10-CM | POA: Diagnosis present

## 2021-10-03 DIAGNOSIS — J4520 Mild intermittent asthma, uncomplicated: Secondary | ICD-10-CM | POA: Diagnosis present

## 2021-10-03 DIAGNOSIS — L409 Psoriasis, unspecified: Secondary | ICD-10-CM | POA: Diagnosis present

## 2021-10-03 LAB — CBG MONITORING, ED: Glucose-Capillary: 103 mg/dL — ABNORMAL HIGH (ref 70–99)

## 2021-10-03 MED ORDER — SODIUM CHLORIDE 0.9 % IV SOLN
3.0000 g | Freq: Four times a day (QID) | INTRAVENOUS | Status: DC
  Administered 2021-10-03 – 2021-10-05 (×8): 3 g via INTRAVENOUS
  Filled 2021-10-03 (×9): qty 8

## 2021-10-03 MED ORDER — HYDROMORPHONE HCL 1 MG/ML IJ SOLN
1.0000 mg | Freq: Once | INTRAMUSCULAR | Status: AC
  Administered 2021-10-03: 1 mg via INTRAVENOUS
  Filled 2021-10-03: qty 1

## 2021-10-03 MED ORDER — LACTULOSE 10 GM/15ML PO SOLN
20.0000 g | Freq: Once | ORAL | Status: AC
  Administered 2021-10-03: 20 g via ORAL
  Filled 2021-10-03: qty 30

## 2021-10-03 MED ORDER — GABAPENTIN 100 MG PO CAPS
100.0000 mg | ORAL_CAPSULE | Freq: Three times a day (TID) | ORAL | Status: DC
  Administered 2021-10-03 – 2021-10-05 (×6): 100 mg via ORAL
  Filled 2021-10-03 (×6): qty 1

## 2021-10-03 MED ORDER — VANCOMYCIN HCL 1750 MG/350ML IV SOLN
1750.0000 mg | INTRAVENOUS | Status: DC
  Administered 2021-10-04 – 2021-10-05 (×2): 1750 mg via INTRAVENOUS
  Filled 2021-10-03 (×2): qty 350

## 2021-10-03 MED ORDER — HEPARIN SOD (PORK) LOCK FLUSH 100 UNIT/ML IV SOLN
500.0000 [IU] | Freq: Once | INTRAVENOUS | Status: AC
  Administered 2021-10-03: 500 [IU]
  Filled 2021-10-03: qty 5

## 2021-10-03 MED ORDER — SENNOSIDES-DOCUSATE SODIUM 8.6-50 MG PO TABS: 2.0000 | ORAL_TABLET | Freq: Once | ORAL | Status: DC

## 2021-10-03 MED ORDER — PANTOPRAZOLE SODIUM 40 MG PO TBEC
40.0000 mg | DELAYED_RELEASE_TABLET | Freq: Every day | ORAL | Status: DC
  Administered 2021-10-03 – 2021-10-05 (×3): 40 mg via ORAL
  Filled 2021-10-03 (×3): qty 1

## 2021-10-03 MED ORDER — ACETAMINOPHEN 325 MG PO TABS
650.0000 mg | ORAL_TABLET | Freq: Four times a day (QID) | ORAL | Status: DC | PRN
  Administered 2021-10-03: 650 mg via ORAL
  Filled 2021-10-03: qty 2

## 2021-10-03 MED ORDER — VANCOMYCIN HCL 2000 MG/400ML IV SOLN
2000.0000 mg | Freq: Once | INTRAVENOUS | Status: AC
  Administered 2021-10-03: 2000 mg via INTRAVENOUS
  Filled 2021-10-03: qty 400

## 2021-10-03 MED ORDER — ONDANSETRON HCL 4 MG PO TABS: 4.0000 mg | ORAL_TABLET | Freq: Four times a day (QID) | ORAL | Status: DC | PRN

## 2021-10-03 MED ORDER — ONDANSETRON HCL 4 MG/2ML IJ SOLN
4.0000 mg | Freq: Once | INTRAMUSCULAR | Status: AC
  Administered 2021-10-03: 4 mg via INTRAVENOUS
  Filled 2021-10-03: qty 2

## 2021-10-03 MED ORDER — SODIUM CHLORIDE 0.9 % IV SOLN
2.0000 g | Freq: Once | INTRAVENOUS | Status: AC
  Administered 2021-10-03: 2 g via INTRAVENOUS
  Filled 2021-10-03: qty 12.5

## 2021-10-03 MED ORDER — PANTOPRAZOLE SODIUM 40 MG IV SOLR: 40.0000 mg | INTRAVENOUS | Status: DC

## 2021-10-03 MED ORDER — LACTATED RINGERS IV SOLN: INTRAVENOUS | Status: AC

## 2021-10-03 MED ORDER — METRONIDAZOLE 500 MG/100ML IV SOLN
500.0000 mg | Freq: Once | INTRAVENOUS | Status: AC
  Administered 2021-10-03: 500 mg via INTRAVENOUS
  Filled 2021-10-03: qty 100

## 2021-10-03 MED ORDER — BUPROPION HCL ER (XL) 150 MG PO TB24
150.0000 mg | ORAL_TABLET | Freq: Every day | ORAL | Status: DC
  Administered 2021-10-03 – 2021-10-04 (×2): 150 mg via ORAL
  Filled 2021-10-03 (×2): qty 1

## 2021-10-03 MED ORDER — SODIUM CHLORIDE 0.9% FLUSH
3.0000 mL | Freq: Two times a day (BID) | INTRAVENOUS | Status: DC
  Administered 2021-10-03 (×2): 3 mL via INTRAVENOUS

## 2021-10-03 MED ORDER — HYDROMORPHONE HCL 1 MG/ML IJ SOLN
1.0000 mg | INTRAMUSCULAR | Status: DC | PRN
  Administered 2021-10-04 – 2021-10-05 (×4): 1 mg via INTRAVENOUS
  Filled 2021-10-03 (×5): qty 1

## 2021-10-03 MED ORDER — LORATADINE 10 MG PO TABS
10.0000 mg | ORAL_TABLET | Freq: Every evening | ORAL | Status: DC
  Administered 2021-10-03 – 2021-10-04 (×2): 10 mg via ORAL
  Filled 2021-10-03 (×2): qty 1

## 2021-10-03 MED ORDER — ACETAMINOPHEN 650 MG RE SUPP: 650.0000 mg | Freq: Four times a day (QID) | RECTAL | Status: DC | PRN

## 2021-10-03 MED ORDER — SENNA 8.6 MG PO TABS
1.0000 | ORAL_TABLET | Freq: Two times a day (BID) | ORAL | Status: DC
  Administered 2021-10-03 – 2021-10-05 (×5): 8.6 mg via ORAL
  Filled 2021-10-03 (×5): qty 1

## 2021-10-03 MED ORDER — PROCHLORPERAZINE EDISYLATE 10 MG/2ML IJ SOLN
10.0000 mg | Freq: Four times a day (QID) | INTRAMUSCULAR | Status: DC | PRN
  Administered 2021-10-03: 10 mg via INTRAVENOUS
  Filled 2021-10-03: qty 2

## 2021-10-03 MED ORDER — ONDANSETRON HCL 4 MG/2ML IJ SOLN: 4.0000 mg | Freq: Four times a day (QID) | INTRAMUSCULAR | Status: DC | PRN

## 2021-10-03 MED ORDER — BUPROPION HCL ER (XL) 300 MG PO TB24
300.0000 mg | ORAL_TABLET | Freq: Every day | ORAL | Status: DC
  Administered 2021-10-03 – 2021-10-04 (×2): 300 mg via ORAL
  Filled 2021-10-03: qty 1
  Filled 2021-10-03: qty 2

## 2021-10-03 NOTE — ED Notes (Signed)
Will update temp as soon as pt wakes up.

## 2021-10-03 NOTE — Progress Notes (Signed)
Pharmacy Antibiotic Note  Darlene Peters is a 51 y.o. female admitted on 10/02/2021 with bacteremia.  Pharmacy has been consulted for IV vancoycin dosing.  In ED, vancomycin 2 g IV administered 7/31 1027 In addition to vancomycin, patient is also on Unasyn.  Plan: Continue vancomycin 1750 mg IV every 24 hours (Goal AUC 400-550. Expected AUC: 510.9. SCr used: rounded up to 0.8) Monitor clinical progress, renal function, vancomycin levels as indicated F/U C&S, abx deescalation / LOT    Height: '5\' 8"'$  (172.7 cm) Weight: 92.5 kg (203 lb 14.8 oz) IBW/kg (Calculated) : 63.9  Temp (24hrs), Avg:98.6 F (37 C), Min:98 F (36.7 C), Max:99 F (37.2 C)  Recent Labs  Lab 09/28/21 1110 10/02/21 1806 10/02/21 1832 10/02/21 2015  WBC 12.1* 4.3  --   --   CREATININE 0.67 0.49 0.50  --   LATICACIDVEN  --  1.2  --  1.1    Estimated Creatinine Clearance: 98.9 mL/min (by C-G formula based on SCr of 0.5 mg/dL).    Allergies  Allergen Reactions   Shellfish Allergy Rash   Fish Allergy    Gluten Meal    Latex    Docetaxel Other (See Comments)    Epigastric pain    Antimicrobials this admission: 7/31 cefepime and Flaygl >> 7/31 7/31 Unasyn >>  7/31 vancomycin >>  Microbiology results: 7/30 BCx: gpr in 1st and 2nd sets  Thank you for allowing pharmacy to be a part of this patient's care.  Suzzanne Cloud, PharmD, BCPS 10/03/2021 2:37 PM

## 2021-10-03 NOTE — H&P (Signed)
History and Physical    Patient: Darlene Peters UKG:[id number] DOB: 28-May-1970 DOA: 10/02/2021 DOS: the patient was seen and examined on 10/03/2021 PCP: [name], MD  Patient coming from: Home  Chief Complaint:  Chief Complaint  Patient presents with   Abdominal Pain   Near Syncope   Weakness   HPI: Darlene Peters is a 51 y.o. female with medical history significant of asthma, breast cancer, GERD, psoriasis, anxiety who under went her first chemo therapy cycle 5 days ago for stage IV breast cancer who presented yesterday to the emergency department due to abdominal pain, constipation for the past 3 days despite normally having multiple BMs daily, palpitations and sudden lightheadedness while in the shower and loosing consciousness briefly.  She has stopped midsternal pleuritic chest pain.  She denied fever, chills, rhinorrhea, sore throat, wheezing or hemoptysis.  No diaphoresis, PND, orthopnea or pitting edema of the lower extremities.  No abdominal pain, nausea, emesis, diarrhea, melena but had mild hematochezia following BM earlier today.  No flank pain, dysuria, frequency or hematuria.  No polyuria, polydipsia, polyphagia or blurred vision.   ED course: Initial vital signs were temperature 99 F, pulse 93, respiration 24, BP 120/73 mmHg O2 sat 98% on room air.  Patient received LR 1000 mL bolus, mineral oil enema, KCl 20 mEq p.o. ondansetron 4 mg p.o., lactulose 20 g p.o with successful treatment of constipation.  The patient was going to be discharged home, but then had to stay to receive cefepime, metronidazole and vancomycin after blood cultures came back positive.  99 F, pulse 93, respirations 24, BP 120/73 mmHg O2 sat 98% on room air.  The patient received 1000 mL of LR bolus, cefepime, metronidazole and vancomycin.  Lab work: Her urinalysis shows small hemoglobinuria and decreased specific gravity but was otherwise normal.  CBC showed white count of 4.3, hemoglobin 12.7  g/dL platelets 122.  Lactic acid was normal x2.  Troponin 2 ng/L x 2.  CMP showed a potassium of 3.3 mmol/L and total bilirubin of 1.5 mg/dL.  Imaging: Portable 1 view chest radiograph no active disease. CTA chest with no PE.  CT abdomen/pelvis with no acute findings in the abdomen or pelvis.  Please see images and full regular report for further details.   Review of Systems: As mentioned in the history of present illness. All other systems reviewed and are negative.  Past Medical History:  Diagnosis Date   Asthma    "worse when lying down"   Breast cancer ([place])    Family history of breast cancer 07/19/2021   Family history of prostate cancer 07/19/2021   GERD (gastroesophageal reflux disease)    Psoriasis    Urticaria    Past Surgical History:  Procedure Laterality Date   ABDOMINAL HYSTERECTOMY     partial   BREAST BIOPSY Left 05/26/2021   BREAST LUMPECTOMY WITH RADIOACTIVE SEED AND SENTINEL LYMPH NODE BIOPSY Left 08/03/2021   Procedure: LEFT BREAST LUMPECTOMY WITH RADIOACTIVE SEED AND SENTINEL LYMPH NODE BIOPSY;  Surgeon: Jovita Kussmaul, MD;  Location: [facility];  Service: General;  Laterality: Left;   IR IMAGING GUIDED PORT INSERTION  09/26/2021   RADIOACTIVE SEED GUIDED AXILLARY SENTINEL LYMPH NODE Left 08/03/2021   Procedure: RADIOACTIVE SEED GUIDED LEFT AXILLARY SENTINEL LYMPH NODE DISSECTION;  Surgeon: Jovita Kussmaul, MD;  Location: [facility];  Service: General;  Laterality: Left;   TUMOR REMOVAL Left 08/2021   breast   Social History:  reports that she  has never smoked. She has never been exposed to tobacco smoke. She has never used smokeless tobacco. She reports that she does not drink alcohol and does not use drugs.  Allergies  Allergen Reactions   Shellfish Allergy Rash   Fish Allergy    Gluten Meal    Latex    Docetaxel Other (See Comments)    Epigastric pain    Family History  Problem Relation Age of Onset   Allergic rhinitis  Mother    Allergic rhinitis Sister    Allergic rhinitis Sister    Breast cancer Maternal Aunt 45   Prostate cancer Maternal Uncle        mets; dx after 50   Prostate cancer Paternal Uncle    Leukemia Paternal Uncle    Prostate cancer Paternal Uncle 41   Gastric cancer Paternal Uncle    Skin cancer Maternal Grandmother 45   Breast cancer Cousin 52       maternal cousin   Cervical cancer Cousin    Breast cancer Cousin        dx 3s; maternal female cousin   Breast cancer Cousin        paternal female cousin x2    Prior to Admission medications   Medication Sig Start Date End Date Taking? Authorizing Provider  Acetaminophen (TYLENOL 8 HOUR PO) Take 1 tablet by mouth every 6 (six) hours as needed (pain).   Yes [provider]  albuterol (PROVENTIL HFA) 108 (90 Base) MCG/ACT inhaler Inhale 2 puffs into the lungs every 4 (four) hours as needed for wheezing or shortness of breath. 08/09/21  Yes Valentina Shaggy, MD  buPROPion (WELLBUTRIN XL) 150 MG 24 hr tablet Take one tablet by mouth in the morning along with the '300mg'$  dose. 09/16/21  Yes   buPROPion (WELLBUTRIN XL) 300 MG 24 hr tablet Take 1 tablet  by mouth daily. 09/16/21  Yes   clobetasol (TEMOVATE) 0.05 % external solution Apply topically to affected areas twice daily as needed (not to face,groin,or axilla) 07/27/21  Yes Allyn Kenner, MD  desonide (DESOWEN) 0.05 % lotion Apply topically to affected areas twice daily as needed (not to face,groin,or axilla) 07/27/21  Yes Allyn Kenner, MD  dexamethasone (DECADRON) 4 MG tablet Take 2 tablets (8 mg total) by mouth 2 (two) times daily. Start the day before Taxotere. Then again the day after chemo for 3 days. 09/19/21  Yes Benay Pike, MD  docusate sodium (COLACE) 100 MG capsule Take 1 capsule (100 mg total) by mouth every 12 (twelve) hours. 10/02/21  Yes Sherwood Gambler, MD  gabapentin (NEURONTIN) 100 MG capsule Take 1 capsule (100 mg total) by mouth 3 (three) times daily. 08/03/21  Yes  Autumn Messing III, MD  levocetirizine (XYZAL) 5 MG tablet Take 1 tablet (5 mg total) by mouth every evening. 08/09/21  Yes Valentina Shaggy, MD  lidocaine-prilocaine (EMLA) cream Apply to affected area once. 09/14/21  Yes Iruku, Arletha Pili, MD  ondansetron (ZOFRAN) 8 MG tablet Take 1 tablet (8 mg total) by mouth 2 (two) times daily as needed for refractory nausea / vomiting. Start on day 3 after chemo. 09/19/21  Yes Iruku, Arletha Pili, MD  polyethylene glycol (MIRALAX / GLYCOLAX) 17 g packet Take 17 g by mouth daily. 10/02/21  Yes Sherwood Gambler, MD  prochlorperazine (COMPAZINE) 10 MG tablet Take 1 tablet (10 mg total) by mouth every 6 (six) hours as needed for nausea or vomiting. 09/19/21  Yes Iruku, Arletha Pili, MD  triamcinolone cream (KENALOG) 0.1 %  Apply topically to affected areas twice daily as needed (not to face,groin,or axilla) Patient not taking: Reported on 10/02/2021 07/27/21   Allyn Kenner, MD    Physical Exam: Vitals:   10/03/21 0500 10/03/21 0530 10/03/21 0600 10/03/21 0712  BP: 115/79 103/81 114/84 112/69  Pulse: 95 98 100 91  Resp: '17 17 17 18  '$ Temp: 98.9 F (37.2 C)   98.5 F (36.9 C)  TempSrc:    Oral  SpO2: 97% 99% 98% 100%  Weight:      Height:       Physical Exam Vitals and nursing note reviewed.  Constitutional:      Appearance: She is well-developed. She is obese. She is not ill-appearing.  HENT:     Head: Normocephalic.     Mouth/Throat:     Mouth: Mucous membranes are moist.  Eyes:     General: No scleral icterus.    Pupils: Pupils are equal, round, and reactive to light.  Cardiovascular:     Rate and Rhythm: Normal rate and regular rhythm.  Pulmonary:     Effort: Pulmonary effort is normal.  Abdominal:     General: Bowel sounds are normal.     Palpations: Abdomen is soft.     Tenderness: There is abdominal tenderness in the left lower quadrant. There is no guarding or rebound.  Musculoskeletal:     Cervical back: Neck supple.     Right lower leg: No edema.      Left lower leg: No edema.  Skin:    General: Skin is warm and dry.  Neurological:     General: No focal deficit present.     Mental Status: She is alert and oriented to person, place, and time.  Psychiatric:        Mood and Affect: Mood normal.        Behavior: Behavior normal.   Data Reviewed:  There are no new results to review at this time.  Assessment and Plan: Principal Problem:   Bacteremia Admit to MedSurg/inpatient. Continue vancomycin per pharmacy. Begin Unasyn every 6 hours. Follow-up blood culture and sensitivity Follow CBC and CMP in a.m.  Active Problems:   Syncope Gentle IV hydration Check echocardiogram.    Constipation Begin senna p.o. twice daily.    Intermittent asthma Supplemental oxygen as needed. Bronchodilators as needed.    Malignant neoplasm of upper-outer quadrant  of left breast in female, estrogen receptor positive ([place]) Follow-up with oncology as scheduled.    Anxiety   Major depressive disorder, single episode, unspecified Continue bupropion 450 mg p.o. daily.    Gastroesophageal reflux disease without esophagitis Continue pantoprazole 40 mg p.o. daily.     Advance Care Planning:   Code Status:Full code.  Consults:   Family Communication:   Severity of Illness: Inpatient.  Author: Reubin Milan, MD 10/03/2021 7:57 AM  For on call review [email or web address].   This document was prepared using Dragon voice recognition software and may contain some unintended transcription errors.

## 2021-10-03 NOTE — ED Provider Notes (Addendum)
Patient care assumed at 2330.  Patient with history of metastatic breast cancer currently undergoing treatment here for evaluation following syncopal event, also complaining of constipation.  Care assumed pending enema.  Patient received mineral oil enema with no significant improvement in her symptoms.  She does have diaphoresis and near syncopal sensation when she attempts to attempt a bowel movement.  Offered second attempt at manual disimpaction and patient declines.  Offered pain medications and patient declines.  Will provide oral lactulose, additional enema attempt.  After second enema patient with large successful BM with improvement in her symptoms.  Plan to discharge home with outpatient follow-up and return precautions.   Quintella Reichert, MD 10/03/21 [phone]   Contacted by pharmacist regarding positive blood cultures.  Pt does report fever to 101 Saturday and Sunday.  She did have a syncopal event at home and has had abdominal pain but this is now resolved.  Given that she is relatively immunocompromised, positive blood cultures we will start antibiotics and admit for ongoing treatment.   Quintella Reichert, MD 10/03/21 [phone]

## 2021-10-03 NOTE — Progress Notes (Addendum)
PHARMACY - PHYSICIAN COMMUNICATION CRITICAL VALUE ALERT - BLOOD CULTURE IDENTIFICATION (BCID)  Darlene Peters is an 51 y.o. female with PMH of metastatic breast cancer on chemo (last 7/25) who presented to [facility] on 10/02/2021 with a chief complaint of syncope, abdominal pain/constipation.  Assessment:  1/4 bottles (aerobic) growing Gram positive rods. BCID not performed for organism. Patient reported a Tm of 101 prior to admission but has remained afebrile while in the ED. WBC 4.3. Documented by MD that patient's abdominal pain resolved after the use of an enema. D/w MD- concern for infection so patient will be admitted for further work-up and antibiotics.   Name of physician (or Provider) Contacted: Dr. Ralene Bathe  Current antibiotics: None  Changes to prescribed antibiotics recommended:  Starting vancomycin/cefepime/flagyl while infectious work-up is pending and source of infection is identified.    No results found for this or any previous visit.  Dimple Nanas, PharmD, BCPS 10/03/2021 6:56 AM

## 2021-10-03 NOTE — Progress Notes (Signed)
A consult was received from an ED physician for vancomycin per pharmacy dosing.  The patient's profile has been reviewed for ht/wt/allergies/indication/available labs.   A one time order has been placed for vancomycin '2000mg'$  IV x1.    Further antibiotics/pharmacy consults should be ordered by admitting physician if indicated.                       Thank you,  Dimple Nanas, PharmD, BCPS 10/03/2021 7:11 AM

## 2021-10-04 ENCOUNTER — Ambulatory Visit: Payer: 59

## 2021-10-04 ENCOUNTER — Inpatient Hospital Stay (HOSPITAL_COMMUNITY): Payer: 59

## 2021-10-04 DIAGNOSIS — R7881 Bacteremia: Secondary | ICD-10-CM | POA: Diagnosis not present

## 2021-10-04 DIAGNOSIS — J4520 Mild intermittent asthma, uncomplicated: Secondary | ICD-10-CM

## 2021-10-04 DIAGNOSIS — Z170 Estrogen receptor positive status [ER+]: Secondary | ICD-10-CM

## 2021-10-04 DIAGNOSIS — R55 Syncope and collapse: Secondary | ICD-10-CM | POA: Diagnosis not present

## 2021-10-04 DIAGNOSIS — K5900 Constipation, unspecified: Secondary | ICD-10-CM

## 2021-10-04 DIAGNOSIS — F419 Anxiety disorder, unspecified: Secondary | ICD-10-CM | POA: Diagnosis not present

## 2021-10-04 DIAGNOSIS — E876 Hypokalemia: Secondary | ICD-10-CM

## 2021-10-04 DIAGNOSIS — D61818 Other pancytopenia: Secondary | ICD-10-CM

## 2021-10-04 DIAGNOSIS — C50412 Malignant neoplasm of upper-outer quadrant of left female breast: Secondary | ICD-10-CM

## 2021-10-04 LAB — COMPREHENSIVE METABOLIC PANEL
ALT: 18 U/L (ref 0–44)
AST: 15 U/L (ref 15–41)
Albumin: 3.1 g/dL — ABNORMAL LOW (ref 3.5–5.0)
Alkaline Phosphatase: 49 U/L (ref 38–126)
Anion gap: 6 (ref 5–15)
BUN: 6 mg/dL (ref 6–20)
CO2: 26 mmol/L (ref 22–32)
Calcium: 8.4 mg/dL — ABNORMAL LOW (ref 8.9–10.3)
Chloride: 104 mmol/L (ref 98–111)
Creatinine, Ser: 0.57 mg/dL (ref 0.44–1.00)
GFR, Estimated: >60 mL/min (ref >60)
Glucose, Bld: 95 mg/dL (ref 70–99)
Potassium: 3.2 mmol/L — ABNORMAL LOW (ref 3.5–5.1)
Sodium: 136 mmol/L (ref 135–145)
Total Bilirubin: 0.8 mg/dL (ref 0.3–1.2)
Total Protein: 5.6 g/dL — ABNORMAL LOW (ref 6.5–8.1)

## 2021-10-04 LAB — CBC
HCT: 31.9 % — ABNORMAL LOW (ref 36.0–46.0)
Hemoglobin: 11.1 g/dL — ABNORMAL LOW (ref 12.0–15.0)
MCH: 31.4 pg (ref 26.0–34.0)
MCHC: 34.8 g/dL (ref 30.0–36.0)
MCV: 90.4 fL (ref 80.0–100.0)
Platelets: 132 10*3/uL — ABNORMAL LOW (ref 150–400)
RBC: 3.53 MIL/uL — ABNORMAL LOW (ref 3.87–5.11)
RDW: 12.6 % (ref 11.5–15.5)
WBC: 1.7 10*3/uL — ABNORMAL LOW (ref 4.0–10.5)
nRBC: 0 % (ref 0.0–0.2)

## 2021-10-04 LAB — ECHOCARDIOGRAM COMPLETE
Area-P 1/2: 4.41 cm2
Calc EF: 50 %
Height: 68 in
S' Lateral: 3 cm
Single Plane A2C EF: 53.6 %
Single Plane A4C EF: 45.6 %
Weight: 3262.81 oz

## 2021-10-04 LAB — MAGNESIUM: Magnesium: 1.7 mg/dL (ref 1.7–2.4)

## 2021-10-04 LAB — HIV ANTIBODY (ROUTINE TESTING W REFLEX): HIV Screen 4th Generation wRfx: NONREACTIVE

## 2021-10-04 MED ORDER — SODIUM CHLORIDE 0.9% FLUSH: 10.0000 mL | Freq: Two times a day (BID) | INTRAVENOUS | Status: DC

## 2021-10-04 MED ORDER — MAGNESIUM SULFATE 2 GM/50ML IV SOLN
2.0000 g | Freq: Once | INTRAVENOUS | Status: AC
  Administered 2021-10-04: 2 g via INTRAVENOUS
  Filled 2021-10-04: qty 50

## 2021-10-04 MED ORDER — POTASSIUM CHLORIDE CRYS ER 20 MEQ PO TBCR
40.0000 meq | EXTENDED_RELEASE_TABLET | Freq: Two times a day (BID) | ORAL | Status: AC
Start: 2021-10-04 — End: 2021-10-04
  Administered 2021-10-04 (×2): 40 meq via ORAL
  Filled 2021-10-04 (×2): qty 2

## 2021-10-04 MED ORDER — BUPROPION HCL ER (XL) 300 MG PO TB24
450.0000 mg | ORAL_TABLET | Freq: Every day | ORAL | Status: DC
Start: 2021-10-05 — End: 2021-10-05
  Administered 2021-10-05: 450 mg via ORAL
  Filled 2021-10-04: qty 1

## 2021-10-04 MED ORDER — SODIUM CHLORIDE 0.9% FLUSH
10.0000 mL | INTRAVENOUS | Status: DC | PRN
  Administered 2021-10-05 (×2): 10 mL

## 2021-10-04 MED ORDER — HYDROCORTISONE 1 % EX CREA
1.0000 | TOPICAL_CREAM | Freq: Three times a day (TID) | CUTANEOUS | Status: DC | PRN
  Administered 2021-10-04: 1 via TOPICAL
  Filled 2021-10-04: qty 28

## 2021-10-04 MED ORDER — CHLORHEXIDINE GLUCONATE CLOTH 2 % EX PADS
6.0000 | MEDICATED_PAD | Freq: Every day | CUTANEOUS | Status: DC
  Administered 2021-10-04 – 2021-10-05 (×2): 6 via TOPICAL

## 2021-10-04 MED ORDER — HYDROCORTISONE 1 % EX CREA
1.0000 | TOPICAL_CREAM | Freq: Two times a day (BID) | CUTANEOUS | Status: DC
  Filled 2021-10-04: qty 28

## 2021-10-04 NOTE — Hospital Course (Signed)
HPI per Dr. Tennis Must on 10/03/21  HPI: Darlene Peters is a 51 y.o. female with medical history significant of asthma, breast cancer, GERD, psoriasis, anxiety who under went her first chemo therapy cycle 5 days ago for stage IV breast cancer who presented yesterday to the emergency department due to abdominal pain, constipation for the past 3 days despite normally having multiple BMs daily, palpitations and sudden lightheadedness while in the shower and loosing consciousness briefly.  She has stopped midsternal pleuritic chest pain.  She denied fever, chills, rhinorrhea, sore throat, wheezing or hemoptysis.  No diaphoresis, PND, orthopnea or pitting edema of the lower extremities.  No abdominal pain, nausea, emesis, diarrhea, melena but had mild hematochezia following BM earlier today.  No flank pain, dysuria, frequency or hematuria.  No polyuria, polydipsia, polyphagia or blurred vision.    ED course: Initial vital signs were temperature 99 F, pulse 93, respiration 24, BP 120/73 mmHg O2 sat 98% on room air.  Patient received LR 1000 mL bolus, mineral oil enema, KCl 20 mEq p.o. ondansetron 4 mg p.o., lactulose 20 g p.o with successful treatment of constipation.  The patient was going to be discharged home, but then had to stay to receive cefepime, metronidazole and vancomycin after blood cultures came back positive.   99 F, pulse 93, respirations 24, BP 120/73 mmHg O2 sat 98% on room air.  The patient received 1000 mL of LR bolus, cefepime, metronidazole and vancomycin.   Lab work: Her urinalysis shows small hemoglobinuria and decreased specific gravity but was otherwise normal.  CBC showed white count of 4.3, hemoglobin 12.7 g/dL platelets 122.  Lactic acid was normal x2.  Troponin 2 ng/L x 2.  CMP showed a potassium of 3.3 mmol/L and total bilirubin of 1.5 mg/dL.   Imaging: Portable 1 view chest radiograph no active disease. CTA chest with no PE.  CT abdomen/pelvis with no acute findings in the  abdomen or pelvis.  Please see images and full regular report for further details.   **Interim History

## 2021-10-04 NOTE — Progress Notes (Signed)
  Echocardiogram 2D Echocardiogram has been performed.  Darlene Peters 10/04/2021, 3:48 PM

## 2021-10-04 NOTE — Progress Notes (Signed)
PROGRESS NOTE    Darlene Peters  DXA:[id number] DOB: 1970/04/27 DOA: 10/02/2021 PCP: [name], MD   Brief Narrative:  HPI per Dr. Tennis Must on 10/03/21  HPI: Darlene Peters is a 51 y.o. female with medical history significant of asthma, breast cancer, GERD, psoriasis, anxiety who under went her first chemo therapy cycle 5 days ago for stage IV breast cancer who presented yesterday to the emergency department due to abdominal pain, constipation for the past 3 days despite normally having multiple BMs daily, palpitations and sudden lightheadedness while in the shower and loosing consciousness briefly.  She has stopped midsternal pleuritic chest pain.  She denied fever, chills, rhinorrhea, sore throat, wheezing or hemoptysis.  No diaphoresis, PND, orthopnea or pitting edema of the lower extremities.  No abdominal pain, nausea, emesis, diarrhea, melena but had mild hematochezia following BM earlier today.  No flank pain, dysuria, frequency or hematuria.  No polyuria, polydipsia, polyphagia or blurred vision.    ED course: Initial vital signs were temperature 99 F, pulse 93, respiration 24, BP 120/73 mmHg O2 sat 98% on room air.  Patient received LR 1000 mL bolus, mineral oil enema, KCl 20 mEq p.o. ondansetron 4 mg p.o., lactulose 20 g p.o with successful treatment of constipation.  The patient was going to be discharged home, but then had to stay to receive cefepime, metronidazole and vancomycin after blood cultures came back positive.   99 F, pulse 93, respirations 24, BP 120/73 mmHg O2 sat 98% on room air.  The patient received 1000 mL of LR bolus, cefepime, metronidazole and vancomycin.   Lab work: Her urinalysis shows small hemoglobinuria and decreased specific gravity but was otherwise normal.  CBC showed white count of 4.3, hemoglobin 12.7 g/dL platelets 122.  Lactic acid was normal x2.  Troponin 2 ng/L x 2.  CMP showed a potassium of 3.3 mmol/L and total bilirubin of 1.5 mg/dL.    Imaging: Portable 1 view chest radiograph no active disease. CTA chest with no PE.  CT abdomen/pelvis with no acute findings in the abdomen or pelvis.  Please see images and full regular report for further details.   **Interim History    Assessment and Plan:  Gram Positive Rod Bacteremia -Admit to MedSurg/inpatient. -Continue vancomycin per pharmacy. -Begin Unasyn every 6 hours. -Follow-up blood culture and sensitivity -ID consulted for further evaluation recommendations and they will see the patient in the morning; unclear etiology of source of bacteremia at this time -Repeat blood cultures -Echocardiogram done and showed an LVEF of 60 to 65% with no ventricular hypertrophy and left ventricular parameters are within normal -Follow CBC and CMP in a.m.  Syncope -Gentle IV hydration -Check echocardiogram and showed normal EF -Check orthostatic vital signs and obtain PT and OT to further evaluate     Constipation Begin senna p.o. twice daily. -Received a soapsuds enema with improvement     Intermittent asthma Supplemental oxygen as needed. Bronchodilators as needed. -SpO2: 100 %     Malignant neoplasm of upper-outer quadrant  of left breast in female, estrogen receptor positive ([place]) Follow-up with oncology as scheduled.  Pancytopenia -Patient's WBC is now 1.7, hemoglobin/hematocrit 11.1/31.9 and platelet count is 132 -Continue monitor and trend and repeat CBC in a.m.   Hypokalemia -Mild at 3.2 and replete -Mag level is 1.7 we will also replete -Continue monitor and trend and repeat CMP in the a.m.    Anxiety   Major depressive disorder, single episode, unspecified Continue bupropion 450 mg p.o. daily.  Gastroesophageal reflux disease without esophagitis Continue pantoprazole 40 mg p.o. daily.  Back pain -May need further imaging given the bacteremia on the CT of the abdomen pelvis with contrast there is nothing noted suspicious on the bones -Start with K-pad  and analgesics -Consider MRI and further work-up  Obesity -Complicates overall prognosis and care -Estimated body mass index is 31.01 kg/m as calculated from the following:   Height as of this encounter: '5\' 8"'$  (1.727 m).   Weight as of this encounter: 92.5 kg.  -Weight Loss and Dietary Counseling given    DVT prophylaxis: SCDs Start: 10/03/21 0815    Code Status: Full Code Family Communication: No family currently at bedside  Disposition Plan:  Level of care: Telemetry Status is: Inpatient Remains inpatient appropriate because: Needs further work-up for bacteremia   Consultants:  Infectious diseases  Procedures:  ECHOCARDIOGRAM IMPRESSIONS     1. Left ventricular ejection fraction, by estimation, is 60 to 65%. The  left ventricle has normal function. The left ventricle has no regional  wall motion abnormalities. Left ventricular diastolic parameters were  normal.   2. Right ventricular systolic function is normal. The right ventricular  size is normal. There is normal pulmonary artery systolic pressure.   3. The mitral valve is normal in structure. No evidence of mitral valve  regurgitation. No evidence of mitral stenosis.   4. The aortic valve is tricuspid. Aortic valve regurgitation is not  visualized. No aortic stenosis is present.   5. The inferior vena cava is normal in size with greater than 50%  respiratory variability, suggesting right atrial pressure of 3 mmHg.   Comparison(s): No prior Echocardiogram.   FINDINGS   Left Ventricle: Left ventricular ejection fraction, by estimation, is 60  to 65%. The left ventricle has normal function. The left ventricle has no  regional wall motion abnormalities. The left ventricular internal cavity  size was normal in size. There is   no left ventricular hypertrophy. Left ventricular diastolic parameters  were normal.   Right Ventricle: The right ventricular size is normal. No increase in  right ventricular wall  thickness. Right ventricular systolic function is  normal. There is normal pulmonary artery systolic pressure. The tricuspid  regurgitant velocity is 2.19 m/s, and   with an assumed right atrial pressure of 3 mmHg, the estimated right  ventricular systolic pressure is 06.3 mmHg.   Left Atrium: Left atrial size was normal in size.   Right Atrium: Right atrial size was normal in size.   Pericardium: There is no evidence of pericardial effusion.   Mitral Valve: The mitral valve is normal in structure. No evidence of  mitral valve regurgitation. No evidence of mitral valve stenosis.   Tricuspid Valve: The tricuspid valve is normal in structure. Tricuspid  valve regurgitation is mild . No evidence of tricuspid stenosis.   Aortic Valve: The aortic valve is tricuspid. Aortic valve regurgitation is  not visualized. No aortic stenosis is present.   Pulmonic Valve: The pulmonic valve was normal in structure. Pulmonic valve  regurgitation is mild. No evidence of pulmonic stenosis.   Aorta: The aortic root and ascending aorta are structurally normal, with  no evidence of dilitation.   Venous: The inferior vena cava is normal in size with greater than 50%  respiratory variability, suggesting right atrial pressure of 3 mmHg.   IAS/Shunts: No atrial level shunt detected by color flow Doppler.      LEFT VENTRICLE  PLAX 2D  LVIDd:  5.00 cm      Diastology  LVIDs:         3.00 cm      LV e' medial:    12.30 cm/s  LV PW:         1.10 cm      LV E/e' medial:  5.8  LV IVS:        1.00 cm      LV e' lateral:   10.10 cm/s  LVOT diam:     2.20 cm      LV E/e' lateral: 7.1  LV SV:         53  LV SV Index:   26  LVOT Area:     3.80 cm     LV Volumes (MOD)  LV vol d, MOD A2C: 96.9 ml  LV vol d, MOD A4C: 108.0 ml  LV vol s, MOD A2C: 45.0 ml  LV vol s, MOD A4C: 58.7 ml  LV SV MOD A2C:     51.9 ml  LV SV MOD A4C:     108.0 ml  LV SV MOD BP:      52.5 ml   RIGHT VENTRICLE  RV S prime:      14.80 cm/s  TAPSE (M-mode): 2.4 cm   LEFT ATRIUM             Index        RIGHT ATRIUM           Index  LA diam:        3.10 cm 1.50 cm/m   RA Area:     14.80 cm  LA Vol (A2C):   39.7 ml 19.27 ml/m  RA Volume:   35.40 ml  17.18 ml/m  LA Vol (A4C):   42.4 ml 20.58 ml/m  LA Biplane Vol: 41.1 ml 19.94 ml/m   AORTIC VALVE             PULMONIC VALVE  LVOT Vmax:   92.95 cm/s  PR End Diast Vel: 4.16 msec  LVOT Vmean:  58.950 cm/s  LVOT VTI:    0.140 m     AORTA  Ao Root diam: 3.20 cm  Ao Asc diam:  3.10 cm   MITRAL VALVE               TRICUSPID VALVE  MV Area (PHT): 4.41 cm    TR Peak grad:   19.2 mmHg  MV Decel Time: 172 msec    TR Vmax:        219.00 cm/s  MV E velocity: 71.60 cm/s  MV A velocity: 62.10 cm/s  SHUNTS  MV E/A ratio:  1.15        Systemic VTI:  0.14 m                             Systemic Diam: 2.20 cm   Antimicrobials:  Anti-infectives (From admission, onward)    Start     Dose/Rate Route Frequency Ordered Stop   10/04/21 1000  vancomycin (VANCOREADY) IVPB 1750 mg/350 mL        1,750 mg 175 mL/hr over 120 Minutes Intravenous Every 24 hours 10/03/21 1435     10/03/21 1600  Ampicillin-Sulbactam (UNASYN) 3 g in sodium chloride 0.9 % 100 mL IVPB        3 g 200 mL/hr over 30 Minutes Intravenous Every 6 hours 10/03/21 1425     10/03/21 0715  ceFEPIme (MAXIPIME) 2 g in sodium chloride 0.9 % 100 mL IVPB       See Hyperspace for full Linked Orders Report.   2 g 200 mL/hr over 30 Minutes Intravenous  Once 10/03/21 0708 10/03/21 0905   10/03/21 0715  metroNIDAZOLE (FLAGYL) IVPB 500 mg       See Hyperspace for full Linked Orders Report.   500 mg 100 mL/hr over 60 Minutes Intravenous  Once 10/03/21 0708 10/03/21 1016   10/03/21 0715  vancomycin (VANCOREADY) IVPB 2000 mg/400 mL        2,000 mg 200 mL/hr over 120 Minutes Intravenous  Once 10/03/21 0708 10/03/21 1227        Subjective: Seen and examined at bedside and she claims she was having some back pain  today.  States that her constipation and belly pain is improved.  No nausea or vomiting.  Denies any lightheadedness or dizziness.  Objective: Vitals:   10/04/21 0555 10/04/21 0849 10/04/21 0940 10/04/21 1345  BP: 113/77 117/76 113/74 118/74  Pulse: 99 (!) 111 99 (!) 110  Resp: '16 18 16 18  '$ Temp: 98.2 F (36.8 C) 98.7 F (37.1 C) 98.2 F (36.8 C) 98.6 F (37 C)  TempSrc: Oral Oral Oral Oral  SpO2: 99% 100% 100% 100%  Weight:      Height:        Intake/Output Summary (Last 24 hours) at 10/04/2021 1855 Last data filed at 10/04/2021 1530 Gross per 24 hour  Intake 543.88 ml  Output 3050 ml  Net -2506.12 ml   Filed Weights   10/02/21 1705  Weight: 92.5 kg   Examination: Physical Exam:  Constitutional: WN/WD obese African-American female currently no acute distress but is complaining about some back pain Respiratory: Diminished to auscultation bilaterally, no wheezing, rales, rhonchi or crackles. Normal respiratory effort and patient is not tachypenic. No accessory muscle use.  Labored breathing Cardiovascular: RRR, no murmurs / rubs / gallops. S1 and S2 auscultated. No extremity edema.   Abdomen: Soft, non-tender, distended secondary to body habitus. bowel sounds positive.  GU: Deferred. Musculoskeletal: No clubbing / cyanosis of digits/nails. No joint deformity upper and lower extremities. Skin: No rashes, lesions, ulcers on limited skin evaluation. No induration; Warm and dry.  Neurologic: CN 2-12 grossly intact with no focal deficits. Romberg sign and cerebellar reflexes not assessed.  Psychiatric: Normal judgment and insight. Alert and oriented x 3. Normal mood and appropriate affect.   Data Reviewed: I have personally reviewed following labs and imaging studies  CBC: Recent Labs  Lab 09/28/21 1110 10/02/21 1806 10/02/21 1832 10/04/21 0347  WBC 12.1* 4.3  --  1.7*  NEUTROABS 10.9* 2.8  --   --   HGB 13.0 12.7 11.9* 11.1*  HCT 36.3 36.4 35.0* 31.9*  MCV 87.5 89.4  --   90.4  PLT 186 122*  --  315*   Basic Metabolic Panel: Recent Labs  Lab 09/28/21 1110 10/02/21 1806 10/02/21 1832 10/04/21 0347  NA 136 140 139 136  K 3.9 3.3* 3.3* 3.2*  CL 107 108 101 104  CO2 24 23  --  26  GLUCOSE 120* 96 98 95  BUN 9 6 3* 6  CREATININE 0.67 0.49 0.50 0.57  CALCIUM 9.2 8.9  --  8.4*  MG  --   --   --  1.7   GFR: Estimated Creatinine Clearance: 98.9 mL/min (by C-G formula based on SCr of 0.57 mg/dL). Liver Function Tests: Recent Labs  Lab 09/28/21 1110 10/02/21 1806  10/04/21 0347  AST '19 17 15  '$ ALT '30 21 18  '$ ALKPHOS 50 51 49  BILITOT 0.7 1.5* 0.8  PROT 7.2 6.6 5.6*  ALBUMIN 4.3 3.8 3.1*   Recent Labs  Lab 10/02/21 1806  LIPASE 24   No results for input(s): "AMMONIA" in the last 168 hours. Coagulation Profile: No results for input(s): "INR", "PROTIME" in the last 168 hours. Cardiac Enzymes: No results for input(s): "CKTOTAL", "CKMB", "CKMBINDEX", "TROPONINI" in the last 168 hours. BNP (last 3 results) No results for input(s): "PROBNP" in the last 8760 hours. HbA1C: No results for input(s): "HGBA1C" in the last 72 hours. CBG: Recent Labs  Lab 10/03/21 0055  GLUCAP 103*   Lipid Profile: No results for input(s): "CHOL", "HDL", "LDLCALC", "TRIG", "CHOLHDL", "LDLDIRECT" in the last 72 hours. Thyroid Function Tests: No results for input(s): "TSH", "T4TOTAL", "FREET4", "T3FREE", "THYROIDAB" in the last 72 hours. Anemia Panel: No results for input(s): "VITAMINB12", "FOLATE", "FERRITIN", "TIBC", "IRON", "RETICCTPCT" in the last 72 hours. Sepsis Labs: Recent Labs  Lab 10/02/21 1806 10/02/21 2015  LATICACIDVEN 1.2 1.1    Recent Results (from the past 240 hour(s))  Culture, blood (routine x 2)     Status: None (Preliminary result)   Collection Time: 10/02/21  6:06 PM   Specimen: BLOOD  Result Value Ref Range Status   Specimen Description   Final    BLOOD PORTA CATH Performed at [facility] [address],  [place], [place] [address]    Special Requests   Final    BOTTLES DRAWN AEROBIC AND ANAEROBIC Blood Culture adequate volume Performed at [facility] [address] [place], [place] [address]    Culture  Setup Time   Final    GRAM POSITIVE RODS AEROBIC BOTTLE ONLY CRITICAL RESULT CALLED TO, READ BACK BY AND VERIFIED WITH: PHARMD MARYASHLYNN TUCKER 10/03/21'@6'$ :39 BY TW Performed at [facility], [place] [address], [place], [place] [address]    Culture GRAM POSITIVE RODS  Final   Report Status PENDING  Incomplete  Culture, blood (routine x 2)     Status: None (Preliminary result)   Collection Time: 10/02/21  6:22 PM   Specimen: BLOOD  Result Value Ref Range Status   Specimen Description   Final    BLOOD SITE NOT SPECIFIED Performed at [facility] [address] [place], [place] [address]    Special Requests   Final    BOTTLES DRAWN AEROBIC AND ANAEROBIC Blood Culture adequate volume Performed at [facility] [address] [place], [place] [address]    Culture  Setup Time   Final    GRAM POSITIVE RODS ANAEROBIC BOTTLE ONLY CRITICAL VALUE NOTED.  VALUE IS CONSISTENT WITH PREVIOUSLY REPORTED AND CALLED VALUE. Performed at [facility], [place] [address], [place], [place] [address]    Culture GRAM POSITIVE RODS  Final   Report Status PENDING  Incomplete  Resp Panel by RT-PCR (Flu A&B, Covid) Anterior Nasal Swab     Status: None   Collection Time: 10/02/21  7:00 PM   Specimen: Anterior Nasal Swab  Result Value Ref Range Status   SARS Coronavirus 2 by RT PCR NEGATIVE NEGATIVE Final    Comment: (NOTE) SARS-CoV-2 target nucleic acids are NOT DETECTED.  The SARS-CoV-2 RNA is generally detectable in upper respiratory specimens during the acute phase of infection. The lowest concentration of SARS-CoV-2 viral copies this assay can detect is 138 copies/mL. A negative result does not  preclude SARS-Cov-2 infection and should  not be used as the sole basis for treatment or other patient management decisions. A negative result may occur with  improper specimen collection/handling, submission of specimen other than nasopharyngeal swab, presence of viral mutation(s) within the areas targeted by this assay, and inadequate number of viral copies(<138 copies/mL). A negative result must be combined with clinical observations, patient history, and epidemiological information. The expected result is Negative.  Fact Sheet for Patients:  EntrepreneurPulse.com.au  Fact Sheet for Healthcare Providers:  IncredibleEmployment.be  This test is no t yet approved or cleared by the [place] FDA and  has been authorized for detection and/or diagnosis of SARS-CoV-2 by FDA under an Emergency Use Authorization (EUA). This EUA will remain  in effect (meaning this test can be used) for the duration of the COVID-19 declaration under Section 564(b)(1) of the Act, 21 U.S.C.section 360bbb-3(b)(1), unless the authorization is terminated  or revoked sooner.       Influenza A by PCR NEGATIVE NEGATIVE Final   Influenza B by PCR NEGATIVE NEGATIVE Final    Comment: (NOTE) The Xpert Xpress SARS-CoV-2/FLU/RSV plus assay is intended as an aid in the diagnosis of influenza from Nasopharyngeal swab specimens and should not be used as a sole basis for treatment. Nasal washings and aspirates are unacceptable for Xpert Xpress SARS-CoV-2/FLU/RSV testing.  Fact Sheet for Patients: EntrepreneurPulse.com.au  Fact Sheet for Healthcare Providers: IncredibleEmployment.be  This test is not yet approved or cleared by the [place] FDA and has been authorized for detection and/or diagnosis of SARS-CoV-2 by FDA under an Emergency Use Authorization (EUA). This EUA will remain in effect (meaning this test can be used) for the duration of the COVID-19 declaration under  Section 564(b)(1) of the Act, 21 U.S.C. section 360bbb-3(b)(1), unless the authorization is terminated or revoked.  Performed at [facility], [place] [address], [place], [place] [address]     Radiology Studies: ECHOCARDIOGRAM COMPLETE  Result Date: 10/04/2021    ECHOCARDIOGRAM REPORT   Patient Name:   Darlene Peters Date of Exam: 10/04/2021 Medical Rec #:  [id number]       Height:       68.0 in Accession #:    [id number]      Weight:       203.9 lb Date of Birth:  Mar 23, 1970      BSA:          2.061 m Patient Age:    74 years        BP:           113/77 mmHg Patient Gender: F               HR:           99 bpm. Exam Location:  Inpatient Procedure: 2D Echo, Cardiac Doppler and Color Doppler Indications:    R55 Syncope  History:        Patient has no prior history of Echocardiogram examinations.                 Signs/Symptoms:Syncope; Risk Factors:GERD.  Sonographer:    Bernadene Person RDCS Referring Phys: [id number] [place]  1. Left ventricular ejection fraction, by estimation, is 60 to 65%. The left ventricle has normal function. The left ventricle has no regional wall motion abnormalities. Left ventricular diastolic parameters were normal.  2. Right ventricular systolic function is normal. The right ventricular size is normal. There is normal pulmonary artery systolic pressure.  3.  The mitral valve is normal in structure. No evidence of mitral valve regurgitation. No evidence of mitral stenosis.  4. The aortic valve is tricuspid. Aortic valve regurgitation is not visualized. No aortic stenosis is present.  5. The inferior vena cava is normal in size with greater than 50% respiratory variability, suggesting right atrial pressure of 3 mmHg. Comparison(s): No prior Echocardiogram. FINDINGS  Left Ventricle: Left ventricular ejection fraction, by estimation, is 60 to 65%. The left ventricle has normal function. The left ventricle has no regional wall motion abnormalities. The  left ventricular internal cavity size was normal in size. There is  no left ventricular hypertrophy. Left ventricular diastolic parameters were normal. Right Ventricle: The right ventricular size is normal. No increase in right ventricular wall thickness. Right ventricular systolic function is normal. There is normal pulmonary artery systolic pressure. The tricuspid regurgitant velocity is 2.19 m/s, and  with an assumed right atrial pressure of 3 mmHg, the estimated right ventricular systolic pressure is 40.0 mmHg. Left Atrium: Left atrial size was normal in size. Right Atrium: Right atrial size was normal in size. Pericardium: There is no evidence of pericardial effusion. Mitral Valve: The mitral valve is normal in structure. No evidence of mitral valve regurgitation. No evidence of mitral valve stenosis. Tricuspid Valve: The tricuspid valve is normal in structure. Tricuspid valve regurgitation is mild . No evidence of tricuspid stenosis. Aortic Valve: The aortic valve is tricuspid. Aortic valve regurgitation is not visualized. No aortic stenosis is present. Pulmonic Valve: The pulmonic valve was normal in structure. Pulmonic valve regurgitation is mild. No evidence of pulmonic stenosis. Aorta: The aortic root and ascending aorta are structurally normal, with no evidence of dilitation. Venous: The inferior vena cava is normal in size with greater than 50% respiratory variability, suggesting right atrial pressure of 3 mmHg. IAS/Shunts: No atrial level shunt detected by color flow Doppler.  LEFT VENTRICLE PLAX 2D LVIDd:         5.00 cm      Diastology LVIDs:         3.00 cm      LV e' medial:    12.30 cm/s LV PW:         1.10 cm      LV E/e' medial:  5.8 LV IVS:        1.00 cm      LV e' lateral:   10.10 cm/s LVOT diam:     2.20 cm      LV E/e' lateral: 7.1 LV SV:         53 LV SV Index:   26 LVOT Area:     3.80 cm  LV Volumes (MOD) LV vol d, MOD A2C: 96.9 ml LV vol d, MOD A4C: 108.0 ml LV vol s, MOD A2C: 45.0 ml LV  vol s, MOD A4C: 58.7 ml LV SV MOD A2C:     51.9 ml LV SV MOD A4C:     108.0 ml LV SV MOD BP:      52.5 ml RIGHT VENTRICLE RV S prime:     14.80 cm/s TAPSE (M-mode): 2.4 cm LEFT ATRIUM             Index        RIGHT ATRIUM           Index LA diam:        3.10 cm 1.50 cm/m   RA Area:     14.80 cm LA Vol (A2C):   39.7 ml 19.27  ml/m  RA Volume:   35.40 ml  17.18 ml/m LA Vol (A4C):   42.4 ml 20.58 ml/m LA Biplane Vol: 41.1 ml 19.94 ml/m  AORTIC VALVE             PULMONIC VALVE LVOT Vmax:   92.95 cm/s  PR End Diast Vel: 4.16 msec LVOT Vmean:  58.950 cm/s LVOT VTI:    0.140 m  AORTA Ao Root diam: 3.20 cm Ao Asc diam:  3.10 cm MITRAL VALVE               TRICUSPID VALVE MV Area (PHT): 4.41 cm    TR Peak grad:   19.2 mmHg MV Decel Time: 172 msec    TR Vmax:        219.00 cm/s MV E velocity: 71.60 cm/s MV A velocity: 62.10 cm/s  SHUNTS MV E/A ratio:  1.15        Systemic VTI:  0.14 m                            Systemic Diam: 2.20 cm Rudean Haskell MD Electronically signed by Rudean Haskell MD Signature Date/Time: 10/04/2021/3:45:35 PM    Final     Scheduled Meds:  [START ON 10/05/2021] buPROPion  450 mg Oral Daily   Chlorhexidine Gluconate Cloth  6 each Topical Daily   gabapentin  100 mg Oral TID   loratadine  10 mg Oral QPM   pantoprazole  40 mg Oral Daily   potassium chloride  40 mEq Oral BID   senna  1 tablet Oral BID   sodium chloride flush  10-40 mL Intracatheter Q12H   sodium chloride flush  3 mL Intravenous Q12H   Continuous Infusions:  ampicillin-sulbactam (UNASYN) IV 3 g (10/04/21 1653)   vancomycin 1,750 mg (10/04/21 1309)    LOS: 1 day   Raiford Noble, DO [facility] Available via Epic secure chat 7am-7pm After these hours, please refer to coverage provider listed on [email or web address] 10/04/2021, 6:55 PM

## 2021-10-05 ENCOUNTER — Encounter: Payer: Self-pay | Admitting: Hematology and Oncology

## 2021-10-05 ENCOUNTER — Encounter (HOSPITAL_BASED_OUTPATIENT_CLINIC_OR_DEPARTMENT_OTHER): Payer: Self-pay | Admitting: Pharmacist

## 2021-10-05 ENCOUNTER — Inpatient Hospital Stay: Payer: 59 | Attending: Hematology and Oncology

## 2021-10-05 ENCOUNTER — Inpatient Hospital Stay: Payer: 59 | Admitting: Physician Assistant

## 2021-10-05 ENCOUNTER — Other Ambulatory Visit (HOSPITAL_BASED_OUTPATIENT_CLINIC_OR_DEPARTMENT_OTHER): Payer: Self-pay

## 2021-10-05 ENCOUNTER — Other Ambulatory Visit (HOSPITAL_COMMUNITY): Payer: Self-pay

## 2021-10-05 ENCOUNTER — Inpatient Hospital Stay: Payer: 59

## 2021-10-05 ENCOUNTER — Telehealth (HOSPITAL_COMMUNITY): Payer: Self-pay | Admitting: Pharmacy Technician

## 2021-10-05 DIAGNOSIS — R7881 Bacteremia: Secondary | ICD-10-CM | POA: Diagnosis not present

## 2021-10-05 DIAGNOSIS — Z800 Family history of malignant neoplasm of digestive organs: Secondary | ICD-10-CM | POA: Insufficient documentation

## 2021-10-05 DIAGNOSIS — Z803 Family history of malignant neoplasm of breast: Secondary | ICD-10-CM | POA: Insufficient documentation

## 2021-10-05 DIAGNOSIS — Z5111 Encounter for antineoplastic chemotherapy: Secondary | ICD-10-CM | POA: Insufficient documentation

## 2021-10-05 DIAGNOSIS — Z79899 Other long term (current) drug therapy: Secondary | ICD-10-CM | POA: Insufficient documentation

## 2021-10-05 DIAGNOSIS — C50412 Malignant neoplasm of upper-outer quadrant of left female breast: Secondary | ICD-10-CM | POA: Diagnosis not present

## 2021-10-05 DIAGNOSIS — F32A Depression, unspecified: Secondary | ICD-10-CM | POA: Insufficient documentation

## 2021-10-05 DIAGNOSIS — B9689 Other specified bacterial agents as the cause of diseases classified elsewhere: Secondary | ICD-10-CM | POA: Diagnosis not present

## 2021-10-05 DIAGNOSIS — Z170 Estrogen receptor positive status [ER+]: Secondary | ICD-10-CM | POA: Insufficient documentation

## 2021-10-05 DIAGNOSIS — Z5189 Encounter for other specified aftercare: Secondary | ICD-10-CM | POA: Insufficient documentation

## 2021-10-05 LAB — CBC WITH DIFFERENTIAL/PLATELET
Abs Immature Granulocytes: 0.77 10*3/uL — ABNORMAL HIGH (ref 0.00–0.07)
Basophils Absolute: 0 10*3/uL (ref 0.0–0.1)
Basophils Relative: 0 %
Eosinophils Absolute: 0.1 10*3/uL (ref 0.0–0.5)
Eosinophils Relative: 1 %
HCT: 32 % — ABNORMAL LOW (ref 36.0–46.0)
Hemoglobin: 11.2 g/dL — ABNORMAL LOW (ref 12.0–15.0)
Immature Granulocytes: 10 %
Lymphocytes Relative: 17 %
Lymphs Abs: 1.4 10*3/uL (ref 0.7–4.0)
MCH: 31.9 pg (ref 26.0–34.0)
MCHC: 35 g/dL (ref 30.0–36.0)
MCV: 91.2 fL (ref 80.0–100.0)
Monocytes Absolute: 1.2 10*3/uL — ABNORMAL HIGH (ref 0.1–1.0)
Monocytes Relative: 14 %
Neutro Abs: 4.7 10*3/uL (ref 1.7–7.7)
Neutrophils Relative %: 58 %
Platelets: 147 10*3/uL — ABNORMAL LOW (ref 150–400)
RBC: 3.51 MIL/uL — ABNORMAL LOW (ref 3.87–5.11)
RDW: 12.6 % (ref 11.5–15.5)
WBC: 8.1 10*3/uL (ref 4.0–10.5)
nRBC: 0.4 % — ABNORMAL HIGH (ref 0.0–0.2)

## 2021-10-05 LAB — COMPREHENSIVE METABOLIC PANEL
ALT: 17 U/L (ref 0–44)
AST: 16 U/L (ref 15–41)
Albumin: 3.1 g/dL — ABNORMAL LOW (ref 3.5–5.0)
Alkaline Phosphatase: 53 U/L (ref 38–126)
Anion gap: 5 (ref 5–15)
BUN: 5 mg/dL — ABNORMAL LOW (ref 6–20)
CO2: 26 mmol/L (ref 22–32)
Calcium: 8.2 mg/dL — ABNORMAL LOW (ref 8.9–10.3)
Chloride: 107 mmol/L (ref 98–111)
Creatinine, Ser: 0.63 mg/dL (ref 0.44–1.00)
GFR, Estimated: >60 mL/min (ref >60)
Glucose, Bld: 89 mg/dL (ref 70–99)
Potassium: 4.1 mmol/L (ref 3.5–5.1)
Sodium: 138 mmol/L (ref 135–145)
Total Bilirubin: 0.7 mg/dL (ref 0.3–1.2)
Total Protein: 5.8 g/dL — ABNORMAL LOW (ref 6.5–8.1)

## 2021-10-05 LAB — CULTURE, BLOOD (ROUTINE X 2)
Special Requests: ADEQUATE
Special Requests: ADEQUATE

## 2021-10-05 LAB — MAGNESIUM: Magnesium: 2 mg/dL (ref 1.7–2.4)

## 2021-10-05 LAB — PHOSPHORUS: Phosphorus: 2.5 mg/dL (ref 2.5–4.6)

## 2021-10-05 MED ORDER — POLYETHYLENE GLYCOL 3350 17 G PO PACK
17.0000 g | PACK | Freq: Every day | ORAL | 0 refills | Status: AC
  Filled 2021-10-05 – 2021-12-06 (×2): qty 14, 14d supply, fill #0

## 2021-10-05 MED ORDER — DOCUSATE SODIUM 100 MG PO CAPS
100.0000 mg | ORAL_CAPSULE | Freq: Two times a day (BID) | ORAL | 0 refills | Status: AC
  Filled 2021-10-05: qty 60, 30d supply, fill #0
  Filled 2021-11-09: qty 100, 50d supply, fill #0
  Filled 2021-12-06 – 2022-01-04 (×2): qty 60, 30d supply, fill #0

## 2021-10-05 MED ORDER — LINEZOLID 600 MG PO TABS: 600.0000 mg | ORAL_TABLET | Freq: Two times a day (BID) | ORAL | 0 refills | Status: DC

## 2021-10-05 MED ORDER — GABAPENTIN 100 MG PO CAPS: 100.0000 mg | ORAL_CAPSULE | Freq: Three times a day (TID) | ORAL | 2 refills | Status: AC

## 2021-10-05 MED ORDER — HEPARIN SOD (PORK) LOCK FLUSH 100 UNIT/ML IV SOLN
500.0000 [IU] | INTRAVENOUS | Status: AC | PRN
Start: 2021-10-05 — End: 2021-10-05
  Administered 2021-10-05: 500 [IU]

## 2021-10-05 MED ORDER — LINEZOLID 600 MG PO TABS
600.0000 mg | ORAL_TABLET | Freq: Two times a day (BID) | ORAL | 0 refills | Status: AC
  Filled 2021-10-05: qty 8, 4d supply, fill #0

## 2021-10-05 MED ORDER — LINEZOLID 600 MG PO TABS: 600.0000 mg | ORAL_TABLET | Freq: Two times a day (BID) | ORAL | Status: DC

## 2021-10-05 NOTE — Consult Note (Signed)
[facility]    Date of Admission:  10/02/2021     Reason for Consult: Bacteremia     Referring Physician: Dr Alfredia Ferguson  Current antibiotics: Vancomycin Unasyn  ASSESSMENT:    51 y.o. female admitted with:  Positive blood cultures with Bacillus spp. Breast cancer on chemotherapy Port-A Cath in place  Discussed at length with the patient her positive blood culture results.  Her admission blood cultures from 7/30 were both drawn from her Port-A-Cath in the emergency department per her report.  1 of these cultures is adequately labeled Port-A-Cath but unfortunately her other culture is labeled as "site not specified" which is a separate issue.  Based on this, her cultures appear consistent with a contaminant as they are both growing the same bacillus species.  Discussed with patient that her central line may be colonized but her presentation is not consistent with true bacteremia.  We discussed treatment strategies including Port-A-Cath removal versus no further antibiotics versus empiric treatment for approximately 1 week.  Given that her repeat peripheral cultures from yesterday are currently no growth and overall suspicion for bacteremia is low, she would prefer to retain her current port and proceed with antibiotic treatment out of caution.  I think this is a reasonable approach given the overall picture.  Additionally, her blood cultures from yesterday are labeled as "left forearm" and "left antecubital".  However, she reports that there was only one blood draw from her left antecubital suggesting that these were mislabeled as well.  RECOMMENDATIONS:    Will look into linezolid which has good in vitro data for bacillus and plan to transition her to complete 7 full days of antibiotic therapy Discontinue Unasyn Maintain vancomycin until insurance coverage for linezolid can be sorted out and then okay to switch Will sign off for now, please call if  needed   Principal Problem:   Bacteremia Active Problems:   Malignant neoplasm of upper-outer quadrant of left breast in female, estrogen receptor positive (HCC)   Anxiety   Gastroesophageal reflux disease without esophagitis   Major depressive disorder, single episode, unspecified   Syncope   Constipation   Intermittent asthma   MEDICATIONS:    Scheduled Meds:  buPROPion  450 mg Oral Daily   Chlorhexidine Gluconate Cloth  6 each Topical Daily   gabapentin  100 mg Oral TID   loratadine  10 mg Oral QPM   pantoprazole  40 mg Oral Daily   senna  1 tablet Oral BID   sodium chloride flush  10-40 mL Intracatheter Q12H   sodium chloride flush  3 mL Intravenous Q12H   Continuous Infusions:  ampicillin-sulbactam (UNASYN) IV 3 g (10/05/21 0335)   vancomycin 1,750 mg (10/04/21 1309)   PRN Meds:.acetaminophen **OR** acetaminophen, hydrocortisone cream, HYDROmorphone (DILAUDID) injection, ondansetron **OR** ondansetron (ZOFRAN) IV, prochlorperazine, sodium chloride flush  HPI:    Darlene Peters is a 51 y.o. female with PMHx of asthma, recently diagnosed breast cancer on chemotherapy, GERD, psoriasis, anxiety who presented 10/02/21 due to abdominal pain, constipation x 3 days, palpitations, and episode of syncope.    She received her first dose of chemotherapy on 09/28/21.  She did not have any congestion, cough, dysuria.  Reports a fever to 100.1 on Sunday.  Upon presentation in the ED her vitals were unremarkable and she was afebrile.  She received fluids and an enema plus lactulose with successful bowel movement and improvement in abdominal pain. UA was negative for evidence of infection, WBC  was 4.3 and CMP was largely unremarkable.  Imaging with CTA chest, CT abd/pelvis also unremarkable.  She was getting ready for discharge when her blood cultures returned positive and she was subsequently admitted to [facility].    Her admission cultures from 7/30 are labeled Porta Cath and  growing Gram positive rods in 1 of 2 bottles.  Another set of cultures labeled Site Not Specified is growing Gram positive rods in 1 of 2 bottles.  Further ID is pending but appears to be Bacillus per the micro lab.  A second set of cultures was obtained yesterday from her peripheral left AC. An Echo also was obtained without evidence of vegetations.  She had her Port placed by IR on 09/26/21.  This was uneventful other than some pain following insertion.  She is currently on Vancomycin and Unasyn.  She has been afebrile throughout the admission.  She has no evidence of neutropenia.  She overall feels okay.  She still feels "strange" with some ongoing lightheadedness when standing and back discomfort.       Past Medical History:  Diagnosis Date   Asthma    "worse when lying down"   Breast cancer ([place])    Family history of breast cancer 07/19/2021   Family history of prostate cancer 07/19/2021   GERD (gastroesophageal reflux disease)    Psoriasis    Urticaria     Social History   Tobacco Use   Smoking status: Never    Passive exposure: Never   Smokeless tobacco: Never  Vaping Use   Vaping Use: Never used  Substance Use Topics   Alcohol use: No   Drug use: No    Family History  Problem Relation Age of Onset   Allergic rhinitis Mother    Allergic rhinitis Sister    Allergic rhinitis Sister    Breast cancer Maternal Aunt 45   Prostate cancer Maternal Uncle        mets; dx after 50   Prostate cancer Paternal Uncle    Leukemia Paternal Uncle    Prostate cancer Paternal Uncle 24   Gastric cancer Paternal Uncle    Skin cancer Maternal Grandmother 56   Breast cancer Cousin 23       maternal cousin   Cervical cancer Cousin    Breast cancer Cousin        dx 1s; maternal female cousin   Breast cancer Cousin        paternal female cousin x2    Allergies  Allergen Reactions   Shellfish Allergy Rash   Fish Allergy    Gluten Meal    Latex    Other     Pt states no blood  transfusion. Is a Jehovah Witness   Docetaxel Other (See Comments)    Epigastric pain    Review of Systems  All other systems reviewed and are negative. Except as otherwise noted.  OBJECTIVE:   Blood pressure 113/85, pulse 100, temperature 98.3 F (36.8 C), temperature source Oral, resp. rate 20, height '5\' 8"'$  (1.727 m), weight 92.5 kg, SpO2 98 %. Body mass index is 31.01 kg/m.  Physical Exam Constitutional:      General: She is not in acute distress.    Appearance: Normal appearance.  HENT:     Head: Normocephalic and atraumatic.  Eyes:     Extraocular Movements: Extraocular movements intact.     Conjunctiva/sclera: Conjunctivae normal.  Cardiovascular:     Comments: Right sided port in place.  No erythema,  TTP, drainage. Pulmonary:     Effort: Pulmonary effort is normal. No respiratory distress.  Abdominal:     General: Abdomen is flat. There is no distension.     Palpations: Abdomen is soft.  Musculoskeletal:     Cervical back: Normal range of motion and neck supple.  Neurological:     General: No focal deficit present.     Mental Status: She is alert and oriented to person, place, and time.  Psychiatric:        Mood and Affect: Mood normal.        Behavior: Behavior normal.      Lab Results: Lab Results  Component Value Date   WBC 8.1 10/05/2021   HGB 11.2 (L) 10/05/2021   HCT 32.0 (L) 10/05/2021   MCV 91.2 10/05/2021   PLT 147 (L) 10/05/2021    Lab Results  Component Value Date   NA 136 10/04/2021   K 3.2 (L) 10/04/2021   CO2 26 10/04/2021   GLUCOSE 95 10/04/2021   BUN 6 10/04/2021   CREATININE 0.57 10/04/2021   CALCIUM 8.4 (L) 10/04/2021   GFRNONAA >60 10/04/2021   GFRAA >60 04/28/2018    Lab Results  Component Value Date   ALT 18 10/04/2021   AST 15 10/04/2021   ALKPHOS 49 10/04/2021   BILITOT 0.8 10/04/2021       Component Value Date/Time   CRP 1 08/09/2021 1505       Component Value Date/Time   ESRSEDRATE 10 08/09/2021 1505     I have reviewed the micro and lab results in Epic.  Imaging: ECHOCARDIOGRAM COMPLETE  Result Date: 10/04/2021    ECHOCARDIOGRAM REPORT   Patient Name:   SERRITA LUETH Date of Exam: 10/04/2021 Medical Rec #:  [id number]       Height:       68.0 in Accession #:    [id number]      Weight:       203.9 lb Date of Birth:  09-Feb-1971      BSA:          2.061 m Patient Age:    71 years        BP:           113/77 mmHg Patient Gender: F               HR:           99 bpm. Exam Location:  Inpatient Procedure: 2D Echo, Cardiac Doppler and Color Doppler Indications:    R55 Syncope  History:        Patient has no prior history of Echocardiogram examinations.                 Signs/Symptoms:Syncope; Risk Factors:GERD.  Sonographer:    Bernadene Person RDCS Referring Phys: [id number] [place]  1. Left ventricular ejection fraction, by estimation, is 60 to 65%. The left ventricle has normal function. The left ventricle has no regional wall motion abnormalities. Left ventricular diastolic parameters were normal.  2. Right ventricular systolic function is normal. The right ventricular size is normal. There is normal pulmonary artery systolic pressure.  3. The mitral valve is normal in structure. No evidence of mitral valve regurgitation. No evidence of mitral stenosis.  4. The aortic valve is tricuspid. Aortic valve regurgitation is not visualized. No aortic stenosis is present.  5. The inferior vena cava is normal in size with greater than 50% respiratory variability, suggesting right atrial pressure of  3 mmHg. Comparison(s): No prior Echocardiogram. FINDINGS  Left Ventricle: Left ventricular ejection fraction, by estimation, is 60 to 65%. The left ventricle has normal function. The left ventricle has no regional wall motion abnormalities. The left ventricular internal cavity size was normal in size. There is  no left ventricular hypertrophy. Left ventricular diastolic parameters were normal. Right  Ventricle: The right ventricular size is normal. No increase in right ventricular wall thickness. Right ventricular systolic function is normal. There is normal pulmonary artery systolic pressure. The tricuspid regurgitant velocity is 2.19 m/s, and  with an assumed right atrial pressure of 3 mmHg, the estimated right ventricular systolic pressure is 85.6 mmHg. Left Atrium: Left atrial size was normal in size. Right Atrium: Right atrial size was normal in size. Pericardium: There is no evidence of pericardial effusion. Mitral Valve: The mitral valve is normal in structure. No evidence of mitral valve regurgitation. No evidence of mitral valve stenosis. Tricuspid Valve: The tricuspid valve is normal in structure. Tricuspid valve regurgitation is mild . No evidence of tricuspid stenosis. Aortic Valve: The aortic valve is tricuspid. Aortic valve regurgitation is not visualized. No aortic stenosis is present. Pulmonic Valve: The pulmonic valve was normal in structure. Pulmonic valve regurgitation is mild. No evidence of pulmonic stenosis. Aorta: The aortic root and ascending aorta are structurally normal, with no evidence of dilitation. Venous: The inferior vena cava is normal in size with greater than 50% respiratory variability, suggesting right atrial pressure of 3 mmHg. IAS/Shunts: No atrial level shunt detected by color flow Doppler.  LEFT VENTRICLE PLAX 2D LVIDd:         5.00 cm      Diastology LVIDs:         3.00 cm      LV e' medial:    12.30 cm/s LV PW:         1.10 cm      LV E/e' medial:  5.8 LV IVS:        1.00 cm      LV e' lateral:   10.10 cm/s LVOT diam:     2.20 cm      LV E/e' lateral: 7.1 LV SV:         53 LV SV Index:   26 LVOT Area:     3.80 cm  LV Volumes (MOD) LV vol d, MOD A2C: 96.9 ml LV vol d, MOD A4C: 108.0 ml LV vol s, MOD A2C: 45.0 ml LV vol s, MOD A4C: 58.7 ml LV SV MOD A2C:     51.9 ml LV SV MOD A4C:     108.0 ml LV SV MOD BP:      52.5 ml RIGHT VENTRICLE RV S prime:     14.80 cm/s TAPSE  (M-mode): 2.4 cm LEFT ATRIUM             Index        RIGHT ATRIUM           Index LA diam:        3.10 cm 1.50 cm/m   RA Area:     14.80 cm LA Vol (A2C):   39.7 ml 19.27 ml/m  RA Volume:   35.40 ml  17.18 ml/m LA Vol (A4C):   42.4 ml 20.58 ml/m LA Biplane Vol: 41.1 ml 19.94 ml/m  AORTIC VALVE             PULMONIC VALVE LVOT Vmax:   92.95 cm/s  PR End Diast Vel: 4.16 msec  LVOT Vmean:  58.950 cm/s LVOT VTI:    0.140 m  AORTA Ao Root diam: 3.20 cm Ao Asc diam:  3.10 cm MITRAL VALVE               TRICUSPID VALVE MV Area (PHT): 4.41 cm    TR Peak grad:   19.2 mmHg MV Decel Time: 172 msec    TR Vmax:        219.00 cm/s MV E velocity: 71.60 cm/s MV A velocity: 62.10 cm/s  SHUNTS MV E/A ratio:  1.15        Systemic VTI:  0.14 m                            Systemic Diam: 2.20 cm Rudean Haskell MD Electronically signed by Rudean Haskell MD Signature Date/Time: 10/04/2021/3:45:35 PM    Final      Imaging independently reviewed in Epic.  [facility] [phone] pager 10/05/2021, 6:27 AM  I have personally spent 80 minutes involved in face-to-face and non-face-to-face activities for this patient on the day of the visit. Professional time spent includes the following activities: Preparing to see the patient (review of tests), Obtaining and/or reviewing separately obtained history (admission/discharge record), Performing a medically appropriate examination and/or evaluation , Ordering medications/tests/procedures, referring and communicating with other health care professionals, Documenting clinical information in the EMR, Independently interpreting results (not separately reported), Communicating results to the patient/family/caregiver, Counseling and educating the patient/family/caregiver and Care coordination (not separately reported).

## 2021-10-05 NOTE — Progress Notes (Signed)
   10/04/21 2119  Assess: MEWS Score  Temp 98.2 F (36.8 C)  BP 112/72  MAP (mmHg) 84  Pulse Rate (!) 114  Resp 20  SpO2 98 %  O2 Device Room Air  Assess: MEWS Score  MEWS Temp 0  MEWS Systolic 0  MEWS Pulse 2  MEWS RR 0  MEWS LOC 0  MEWS Score 2  MEWS Score Color Yellow  Assess: if the MEWS score is Yellow or Red  Were vital signs taken at a resting state? Yes  Focused Assessment Change from prior assessment (see assessment flowsheet)  Does the patient meet 2 or more of the SIRS criteria? Yes  Does the patient have a confirmed or suspected source of infection? Yes  Provider and Rapid Response Notified? Yes  MEWS guidelines implemented *See Row Information* Yes  Treat  MEWS Interventions Escalated (See documentation below)  Pain Scale 0-10  Pain Score 8  Pain Type Acute pain  Pain Location Back  Pain Orientation Lower  Pain Descriptors / Indicators Aching;Discomfort  Pain Frequency Intermittent  Pain Onset Gradual  Patients Stated Pain Goal 0  Pain Intervention(s) Medication (See eMAR)  Take Vital Signs  Increase Vital Sign Frequency  Yellow: Q 2hr X 2 then Q 4hr X 2, if remains yellow, continue Q 4hrs  Escalate  MEWS: Escalate Yellow: discuss with charge nurse/RN and consider discussing with provider and RRT  Notify: Charge Nurse/RN  Name of Charge Nurse/RN Notified Kerry Dory, RN  Date Charge Nurse/RN Notified 10/04/21  Time Charge Nurse/RN Notified 2120  Notify: Provider  Provider Name/Title Lennox Grumbles, NP  Date Provider Notified 10/04/21  Time Provider Notified 2120  Method of Notification Page  Notification Reason Other (Comment) (mews yellow d/t elevated HR)  Provider response No new orders  Date of Provider Response 10/04/21  Time of Provider Response 2122  Document  Patient Outcome Not stable and remains on department  Progress note created (see row info) Yes  Assess: SIRS CRITERIA  SIRS Temperature  0  SIRS Pulse 1  SIRS Respirations  0  SIRS WBC 1  SIRS  Score Sum  2   Pt's mews yellow d/t increased HR. MD and charge RN notified. Yellow mews initiated per protocol. Plan of care ongoing.

## 2021-10-05 NOTE — Progress Notes (Signed)
  Transition of Care [facility]) Screening Note   Patient Details  Name: Darlene Peters Date of Birth: 1970-04-18   Transition of Care [facility]) CM/SW Contact:    Vassie Moselle, LCSW Phone Number: 10/05/2021, 12:48 PM    Transition of Care Department [facility]) has reviewed patient and no TOC needs have been identified at this time. We will continue to monitor patient advancement through interdisciplinary progression rounds. If new patient transition needs arise, please place a TOC consult.

## 2021-10-05 NOTE — TOC Benefit Eligibility Note (Signed)
Patient Teacher, English as a foreign language completed.    The patient is currently admitted and upon discharge could be taking Linezolid (Zyvox) 600 mg tablets.  The current 4 day co-pay is $0.00.   The patient is insured through [place], [place] Patient [place] Patient Advocate Team Direct Number: [phone]  Fax: [phone]

## 2021-10-05 NOTE — Telephone Encounter (Signed)
Pharmacy Patient Advocate Encounter  Insurance verification completed.    The patient is insured through [employer] Part D   The patient is currently admitted and ran test claims for the following: Linezolid (Zyvox) 600 mg tablets..  Copays and coinsurance results were relayed to Inpatient clinical team.

## 2021-10-05 NOTE — Progress Notes (Signed)
Patient called from inpatient to go over details of [employer].  Patient did not have the expense sheet available to go along, however she states she would put the notes in her phone. Discussed a few expenses and how they are covered and she advised how she would like to use. Advised what is needed for these expenses and it would take two weeks after receiving information for merchant to receive the check. She verbalized understanding.   Provided my contact name and number as well as phone and fax number for any additional financial questions or concerns.

## 2021-10-05 NOTE — Discharge Summary (Signed)
Physician Discharge Summary  Darlene Peters BMW:[id number] DOB: 1971-02-02 DOA: 10/02/2021  PCP: Darlene Salina, MD  Admit date: 10/02/2021 Discharge date: 10/05/2021  Admitted From: home Disposition:  home  Recommendations for Outpatient Follow-up:  Follow up with Oncology as scheduled  Home Health: none Equipment/Devices: none  Discharge Condition: stable CODE STATUS: Full code Diet Orders (From admission, onward)     Start     Ordered   10/03/21 1056  DIET SOFT Room service appropriate? Yes; Fluid consistency: Thin  Diet effective now       Question Answer Comment  Room service appropriate? Yes   Fluid consistency: Thin      10/03/21 1055            HPI: Per admitting MD, Darlene Peters is a 51 y.o. female with medical history significant of asthma, breast cancer, GERD, psoriasis, anxiety who under went her first chemo therapy cycle 5 days ago for stage IV breast cancer who presented yesterday to the emergency department due to abdominal pain, constipation for the past 3 days despite normally having multiple BMs daily, palpitations and sudden lightheadedness while in the shower and loosing consciousness briefly.  She has stopped midsternal pleuritic chest pain.  She denied fever, chills, rhinorrhea, sore throat, wheezing or hemoptysis.  No diaphoresis, PND, orthopnea or pitting edema of the lower extremities.  No abdominal pain, nausea, emesis, diarrhea, melena but had mild hematochezia following BM earlier today.  No flank pain, dysuria, frequency or hematuria.  No polyuria, polydipsia, polyphagia or blurred vision.   Hospital Course / Discharge diagnoses: Principal Problem:   Bacteremia Active Problems:   Malignant neoplasm of upper-outer quadrant of left breast in female, estrogen receptor positive (HCC)   Anxiety   Gastroesophageal reflux disease without esophagitis   Major depressive disorder, single episode, unspecified   Syncope   Constipation    Intermittent asthma   Principal problem Bacillus in blood cultures-ID consulted and evaluated patient while hospitalized.  Should proceed with from port cultures but not briefly.  Thought to be contaminant, but given relative immunosuppression ID recommends to treat for a week.  She has 4 additional days left, will be switched to linezolid.  Active problems Constipation-improved after enema Intermittent asthma-stable, no wheezing, breathing well on room air Breast cancer-follow-up with oncology as an outpatient Pancytopenia-improved Hypokalemia-repleted Syncope-patient received gentle hydration, underwent an echocardiogram which showed normal EF. Anxiety-continue home medications  Sepsis ruled out   Discharge Instructions   Allergies as of 10/05/2021       Reactions   Shellfish Allergy Rash   Fish Allergy    Gluten Meal    Latex    Other    Pt states no blood transfusion. Is a Jehovah Witness   Docetaxel Other (See Comments)   Epigastric pain        Medication List     TAKE these medications    albuterol 108 (90 Base) MCG/ACT inhaler Commonly known as: Proventil HFA Inhale 2 puffs into the lungs every 4 (four) hours as needed for wheezing or shortness of breath.   buPROPion 150 MG 24 hr tablet Commonly known as: WELLBUTRIN XL Take one tablet by mouth in the morning along with the '300mg'$  dose.   buPROPion 300 MG 24 hr tablet Commonly known as: WELLBUTRIN XL Take 1 tablet  by mouth daily.   clobetasol 0.05 % external solution Commonly known as: TEMOVATE Apply topically to affected areas twice daily as needed (not to face,groin,or axilla)   desonide  0.05 % lotion Commonly known as: DESOWEN Apply topically to affected areas twice daily as needed (not to face,groin,or axilla)   dexamethasone 4 MG tablet Commonly known as: DECADRON Take 2 tablets (8 mg total) by mouth 2 (two) times daily. Start the day before Taxotere. Then again the day after chemo for 3 days.    docusate sodium 100 MG capsule Commonly known as: COLACE Take 1 capsule (100 mg total) by mouth every 12 (twelve) hours.   gabapentin 100 MG capsule Commonly known as: Neurontin Take 1 capsule (100 mg total) by mouth 3 (three) times daily.   levocetirizine 5 MG tablet Commonly known as: XYZAL Take 1 tablet (5 mg total) by mouth every evening.   lidocaine-prilocaine cream Commonly known as: EMLA Apply to affected area once.   linezolid 600 MG tablet Commonly known as: ZYVOX Take 1 tablet (600 mg total) by mouth every 12 (twelve) hours for 4 days. Start taking on: October 06, 2021   ondansetron 8 MG tablet Commonly known as: Zofran Take 1 tablet (8 mg total) by mouth 2 (two) times daily as needed for refractory nausea / vomiting. Start on day 3 after chemo.   polyethylene glycol 17 g packet Commonly known as: MIRALAX / GLYCOLAX Take 17 g by mouth daily.   prochlorperazine 10 MG tablet Commonly known as: COMPAZINE Take 1 tablet (10 mg total) by mouth every 6 (six) hours as needed for nausea or vomiting.   triamcinolone cream 0.1 % Commonly known as: KENALOG Apply topically to affected areas twice daily as needed (not to face,groin,or axilla)   TYLENOL 8 HOUR PO Take 1 tablet by mouth every 6 (six) hours as needed (pain).        Follow-up Information     Darlene Salina, MD.   Specialty: Obstetrics and Gynecology Contact information: [address] [place] [address] [phone]         Darlene Pike, MD. Call in 1 day.   Specialty: Hematology and Oncology Contact information: [place] [address] [place] [facility].   Specialty: Emergency Medicine Why: If symptoms worsen Contact information: [place] [id number] [place] [address] [phone]                Consultations: ID  Procedures/Studies:  ECHOCARDIOGRAM  COMPLETE  Result Date: 10/04/2021    ECHOCARDIOGRAM REPORT   Patient Name:   Darlene Peters Date of Exam: 10/04/2021 Medical Rec #:  [id number]       Height:       68.0 in Accession #:    [id number]      Weight:       203.9 lb Date of Birth:  03-03-1971      BSA:          2.061 m Patient Age:    2 years        BP:           113/77 mmHg Patient Gender: F               HR:           99 bpm. Exam Location:  Inpatient Procedure: 2D Echo, Cardiac Doppler and Color Doppler Indications:    R55 Syncope  History:        Patient has no prior history of Echocardiogram examinations.  Signs/Symptoms:Syncope; Risk Factors:GERD.  Sonographer:    Bernadene Person RDCS Referring Phys: [id number] [place]  1. Left ventricular ejection fraction, by estimation, is 60 to 65%. The left ventricle has normal function. The left ventricle has no regional wall motion abnormalities. Left ventricular diastolic parameters were normal.  2. Right ventricular systolic function is normal. The right ventricular size is normal. There is normal pulmonary artery systolic pressure.  3. The mitral valve is normal in structure. No evidence of mitral valve regurgitation. No evidence of mitral stenosis.  4. The aortic valve is tricuspid. Aortic valve regurgitation is not visualized. No aortic stenosis is present.  5. The inferior vena cava is normal in size with greater than 50% respiratory variability, suggesting right atrial pressure of 3 mmHg. Comparison(s): No prior Echocardiogram. FINDINGS  Left Ventricle: Left ventricular ejection fraction, by estimation, is 60 to 65%. The left ventricle has normal function. The left ventricle has no regional wall motion abnormalities. The left ventricular internal cavity size was normal in size. There is  no left ventricular hypertrophy. Left ventricular diastolic parameters were normal. Right Ventricle: The right ventricular size is normal. No increase in right ventricular wall  thickness. Right ventricular systolic function is normal. There is normal pulmonary artery systolic pressure. The tricuspid regurgitant velocity is 2.19 m/s, and  with an assumed right atrial pressure of 3 mmHg, the estimated right ventricular systolic pressure is 35.5 mmHg. Left Atrium: Left atrial size was normal in size. Right Atrium: Right atrial size was normal in size. Pericardium: There is no evidence of pericardial effusion. Mitral Valve: The mitral valve is normal in structure. No evidence of mitral valve regurgitation. No evidence of mitral valve stenosis. Tricuspid Valve: The tricuspid valve is normal in structure. Tricuspid valve regurgitation is mild . No evidence of tricuspid stenosis. Aortic Valve: The aortic valve is tricuspid. Aortic valve regurgitation is not visualized. No aortic stenosis is present. Pulmonic Valve: The pulmonic valve was normal in structure. Pulmonic valve regurgitation is mild. No evidence of pulmonic stenosis. Aorta: The aortic root and ascending aorta are structurally normal, with no evidence of dilitation. Venous: The inferior vena cava is normal in size with greater than 50% respiratory variability, suggesting right atrial pressure of 3 mmHg. IAS/Shunts: No atrial level shunt detected by color flow Doppler.  LEFT VENTRICLE PLAX 2D LVIDd:         5.00 cm      Diastology LVIDs:         3.00 cm      LV e' medial:    12.30 cm/s LV PW:         1.10 cm      LV E/e' medial:  5.8 LV IVS:        1.00 cm      LV e' lateral:   10.10 cm/s LVOT diam:     2.20 cm      LV E/e' lateral: 7.1 LV SV:         53 LV SV Index:   26 LVOT Area:     3.80 cm  LV Volumes (MOD) LV vol d, MOD A2C: 96.9 ml LV vol d, MOD A4C: 108.0 ml LV vol s, MOD A2C: 45.0 ml LV vol s, MOD A4C: 58.7 ml LV SV MOD A2C:     51.9 ml LV SV MOD A4C:     108.0 ml LV SV MOD BP:      52.5 ml RIGHT VENTRICLE RV S prime:  14.80 cm/s TAPSE (M-mode): 2.4 cm LEFT ATRIUM             Index        RIGHT ATRIUM           Index LA  diam:        3.10 cm 1.50 cm/m   RA Area:     14.80 cm LA Vol (A2C):   39.7 ml 19.27 ml/m  RA Volume:   35.40 ml  17.18 ml/m LA Vol (A4C):   42.4 ml 20.58 ml/m LA Biplane Vol: 41.1 ml 19.94 ml/m  AORTIC VALVE             PULMONIC VALVE LVOT Vmax:   92.95 cm/s  PR End Diast Vel: 4.16 msec LVOT Vmean:  58.950 cm/s LVOT VTI:    0.140 m  AORTA Ao Root diam: 3.20 cm Ao Asc diam:  3.10 cm MITRAL VALVE               TRICUSPID VALVE MV Area (PHT): 4.41 cm    TR Peak grad:   19.2 mmHg MV Decel Time: 172 msec    TR Vmax:        219.00 cm/s MV E velocity: 71.60 cm/s MV A velocity: 62.10 cm/s  SHUNTS MV E/A ratio:  1.15        Systemic VTI:  0.14 m                            Systemic Diam: 2.20 cm Rudean Haskell MD Electronically signed by Rudean Haskell MD Signature Date/Time: 10/04/2021/3:45:35 PM    Final    CT Angio Chest PE W and/or Wo Contrast  Result Date: 10/02/2021 CLINICAL DATA:  Near syncopal episode. High probability for pulmonary embolism. Sepsis. Suspected bowel obstruction. EXAM: CT ANGIOGRAPHY CHEST CT ABDOMEN AND PELVIS WITH CONTRAST TECHNIQUE: Multidetector CT imaging of the chest was performed using the standard protocol during bolus administration of intravenous contrast. Multiplanar CT image reconstructions and MIPs were obtained to evaluate the vascular anatomy. Multidetector CT imaging of the abdomen and pelvis was performed using the standard protocol during bolus administration of intravenous contrast. RADIATION DOSE REDUCTION: This exam was performed according to the departmental dose-optimization program which includes automated exposure control, adjustment of the mA and/or kV according to patient size and/or use of iterative reconstruction technique. CONTRAST:  126m OMNIPAQUE IOHEXOL 350 MG/ML SOLN COMPARISON:  None Available. FINDINGS: CTA CHEST FINDINGS Cardiovascular: Satisfactory opacification of pulmonary arteries noted, and no pulmonary emboli identified. No evidence of  thoracic aortic dissection or aneurysm. Mediastinum/Nodes: No masses or pathologically enlarged lymph nodes identified. Lungs/Pleura: No pulmonary mass, infiltrate, or effusion. Upper abdomen: No acute findings. Musculoskeletal: No suspicious bone lesions identified. Review of the MIP images confirms the above findings. CT ABDOMEN and PELVIS FINDINGS Lower Chest: No acute findings. Hepatobiliary: No hepatic masses identified.Multiple small cysts are seen in both right and left lobes. Gallbladder is unremarkable. No evidence of biliary ductal dilatation. Pancreas:  No mass or inflammatory changes. Spleen: Within normal limits in size and appearance. Adrenals/Urinary Tract: No masses identified. Tiny right renal cyst noted no evidence of ureteral calculi or hydronephrosis. Urinary bladder is mildly distended but otherwise unremarkable. Stomach/Bowel: No evidence of obstruction, inflammatory process or abnormal fluid collections. Normal appendix visualized. Vascular/Lymphatic: No pathologically enlarged lymph nodes. No acute vascular findings. Reproductive: Prior hysterectomy noted. Adnexal regions are unremarkable in appearance. Other:  None. Musculoskeletal:  No suspicious bone lesions  identified. Review of the MIP images confirms the above findings. IMPRESSION: No evidence of pulmonary embolism or other active disease within the thorax. No acute findings within the abdomen or pelvis. No evidence of bowel obstruction or acute inflammatory process Mildly distended urinary bladder; recommend clinical correlation to exclude possibility of urinary retention. Electronically Signed   By: Marlaine Hind M.D.   On: 10/02/2021 19:54   CT ABDOMEN PELVIS W CONTRAST  Result Date: 10/02/2021 CLINICAL DATA:  Near syncopal episode. High probability for pulmonary embolism. Sepsis. Suspected bowel obstruction. EXAM: CT ANGIOGRAPHY CHEST CT ABDOMEN AND PELVIS WITH CONTRAST TECHNIQUE: Multidetector CT imaging of the chest was  performed using the standard protocol during bolus administration of intravenous contrast. Multiplanar CT image reconstructions and MIPs were obtained to evaluate the vascular anatomy. Multidetector CT imaging of the abdomen and pelvis was performed using the standard protocol during bolus administration of intravenous contrast. RADIATION DOSE REDUCTION: This exam was performed according to the departmental dose-optimization program which includes automated exposure control, adjustment of the mA and/or kV according to patient size and/or use of iterative reconstruction technique. CONTRAST:  155m OMNIPAQUE IOHEXOL 350 MG/ML SOLN COMPARISON:  None Available. FINDINGS: CTA CHEST FINDINGS Cardiovascular: Satisfactory opacification of pulmonary arteries noted, and no pulmonary emboli identified. No evidence of thoracic aortic dissection or aneurysm. Mediastinum/Nodes: No masses or pathologically enlarged lymph nodes identified. Lungs/Pleura: No pulmonary mass, infiltrate, or effusion. Upper abdomen: No acute findings. Musculoskeletal: No suspicious bone lesions identified. Review of the MIP images confirms the above findings. CT ABDOMEN and PELVIS FINDINGS Lower Chest: No acute findings. Hepatobiliary: No hepatic masses identified.Multiple small cysts are seen in both right and left lobes. Gallbladder is unremarkable. No evidence of biliary ductal dilatation. Pancreas:  No mass or inflammatory changes. Spleen: Within normal limits in size and appearance. Adrenals/Urinary Tract: No masses identified. Tiny right renal cyst noted no evidence of ureteral calculi or hydronephrosis. Urinary bladder is mildly distended but otherwise unremarkable. Stomach/Bowel: No evidence of obstruction, inflammatory process or abnormal fluid collections. Normal appendix visualized. Vascular/Lymphatic: No pathologically enlarged lymph nodes. No acute vascular findings. Reproductive: Prior hysterectomy noted. Adnexal regions are unremarkable  in appearance. Other:  None. Musculoskeletal:  No suspicious bone lesions identified. Review of the MIP images confirms the above findings. IMPRESSION: No evidence of pulmonary embolism or other active disease within the thorax. No acute findings within the abdomen or pelvis. No evidence of bowel obstruction or acute inflammatory process Mildly distended urinary bladder; recommend clinical correlation to exclude possibility of urinary retention. Electronically Signed   By: JMarlaine HindM.D.   On: 10/02/2021 19:54   DG Chest Portable 1 View  Result Date: 10/02/2021 CLINICAL DATA:  Fever and chest pain. EXAM: PORTABLE CHEST 1 VIEW COMPARISON:  Chest x-ray 10/04/2015 FINDINGS: Right chest port catheter tip projects over the SVC. There are left axillary surgical clips. The heart size and mediastinal contours are within normal limits. Both lungs are clear. The visualized skeletal structures are unremarkable. IMPRESSION: No active disease. Electronically Signed   By: ARonney AstersM.D.   On: 10/02/2021 17:54   IR IMAGING GUIDED PORT INSERTION  Result Date: 09/26/2021 INDICATION: 51year old female referred for port catheter EXAM: IMAGE GUIDED PORT CATHETER MEDICATIONS: None ANESTHESIA/SEDATION: Moderate (conscious) sedation was employed during this procedure. A total of Versed 2.0 mg and Fentanyl 100 mcg was administered intravenously. Moderate Sedation Time: 24 minutes. The patient's level of consciousness and vital signs were monitored continuously by radiology nursing throughout the procedure under my  direct supervision. FLUOROSCOPY TIME:  Fluoroscopy Time:  (1 mGy). COMPLICATIONS: None PROCEDURE: The procedure, risks, benefits, and alternatives were explained to the patient. Questions regarding the procedure were encouraged and answered. The patient understands and consents to the procedure. Ultrasound survey was performed with images stored and sent to PACs. Right IJ vein documented to be patent. The right  neck and chest was prepped with chlorhexidine, and draped in the usual sterile fashion using maximum barrier technique (cap and mask, sterile gown, sterile gloves, large sterile sheet, hand hygiene and cutaneous antiseptic). Local anesthesia was attained by infiltration with 1% lidocaine without epinephrine. Ultrasound demonstrated patency of the right internal jugular vein, and this was documented with an image. Under real-time ultrasound guidance, this vein was accessed with a 21 gauge micropuncture needle and image documentation was performed. A small dermatotomy was made at the access site with an 11 scalpel. A 0.018" wire was advanced into the SVC and used to estimate the length of the internal catheter. The access needle exchanged for a 51F micropuncture vascular sheath. The 0.018" wire was then removed and a 0.035" wire advanced into the IVC. An appropriate location for the subcutaneous reservoir was selected below the clavicle and an incision was made through the skin and underlying soft tissues. The subcutaneous tissues were then dissected using a combination of blunt and sharp surgical technique and a pocket was formed. A single lumen power injectable portacatheter was then tunneled through the subcutaneous tissues from the pocket to the dermatotomy and the port reservoir placed within the subcutaneous pocket. The venous access site was then serially dilated and a peel away vascular sheath placed over the wire. The wire was removed and the port catheter advanced into position under fluoroscopic guidance. The catheter tip is positioned in the cavoatrial junction. This was documented with a spot image. The portacatheter was then tested and found to flush and aspirate well. The port was flushed with saline followed by 100 units/mL heparinized saline. The pocket was then closed in two layers using first subdermal inverted interrupted absorbable sutures followed by a running subcuticular suture. The epidermis  was then sealed with Dermabond. The dermatotomy at the venous access site was also seal with Dermabond. Patient tolerated the procedure well and remained hemodynamically stable throughout. No complications encountered and no significant blood loss encountered IMPRESSION: Status post right IJ port catheter placement Signed, Dulcy Fanny. Nadene Rubins, RPVI [facility] Radiology Electronically Signed   By: Corrie Mckusick D.O.   On: 09/26/2021 13:34     Subjective: - no shortness of breath, no abdominal pain, nausea or vomiting.   Discharge Exam: BP 114/75 (BP Location: Right Arm)   Pulse (!) 101   Temp 98.3 F (36.8 C) (Oral)   Resp 18   Ht '5\' 8"'$  (1.727 m)   Wt 92.1 kg   LMP  (LMP Unknown)   SpO2 99%   BMI 30.87 kg/m   General: Pt is alert, awake, not in acute distress Cardiovascular: RRR, S1/S2 +, no rubs, no gallops Respiratory: CTA bilaterally, no wheezing, no rhonchi Extremities: no edema, no cyanosis   The results of significant diagnostics from this hospitalization (including imaging, microbiology, ancillary and laboratory) are listed below for reference.     Microbiology: Recent Results (from the past 240 hour(s))  Culture, blood (routine x 2)     Status: Abnormal   Collection Time: 10/02/21  6:06 PM   Specimen: BLOOD  Result Value Ref Range Status  Specimen Description   Final    BLOOD PORTA CATH Performed at [facility] [address], [place], [place] [address]    Special Requests   Final    BOTTLES DRAWN AEROBIC AND ANAEROBIC Blood Culture adequate volume Performed at [facility] [address], [place], [place] [address]    Culture  Setup Time   Final    GRAM POSITIVE RODS AEROBIC BOTTLE ONLY CRITICAL RESULT CALLED TO, READ BACK BY AND VERIFIED WITH: PHARMD MARYASHLYNN TUCKER 10/03/21'@6'$ :39 BY TW    Culture (A)  Final    BACILLUS SPECIES Standardized susceptibility  testing for this organism is not available. Performed at [facility], [place] [address], [place], [place] [address]    Report Status 10/05/2021 FINAL  Final  Culture, blood (routine x 2)     Status: Abnormal   Collection Time: 10/02/21  6:22 PM   Specimen: BLOOD  Result Value Ref Range Status   Specimen Description   Final    BLOOD SITE NOT SPECIFIED Performed at [facility] [address] [place], [place] [address]    Special Requests   Final    BOTTLES DRAWN AEROBIC AND ANAEROBIC Blood Culture adequate volume Performed at [facility] [address] [place], [place] [address]    Culture  Setup Time   Final    GRAM POSITIVE RODS ANAEROBIC BOTTLE ONLY CRITICAL VALUE NOTED.  VALUE IS CONSISTENT WITH PREVIOUSLY REPORTED AND CALLED VALUE.    Culture (A)  Final    BACILLUS SPECIES Standardized susceptibility testing for this organism is not available. Performed at [facility], [place] [address] [place],  [address]    Report Status 10/05/2021 FINAL  Final  Resp Panel by RT-PCR (Flu A&B, Covid) Anterior Nasal Swab     Status: None   Collection Time: 10/02/21  7:00 PM   Specimen: Anterior Nasal Swab  Result Value Ref Range Status   SARS Coronavirus 2 by RT PCR NEGATIVE NEGATIVE Final    Comment: (NOTE) SARS-CoV-2 target nucleic acids are NOT DETECTED.  The SARS-CoV-2 RNA is generally detectable in upper respiratory specimens during the acute phase of infection. The lowest concentration of SARS-CoV-2 viral copies this assay can detect is 138 copies/mL. A negative result does not preclude SARS-Cov-2 infection and should not be used as the sole basis for treatment or other patient management decisions. A negative result may occur with  improper specimen collection/handling, submission of specimen other than nasopharyngeal swab, presence of viral mutation(s) within the areas targeted by this assay, and inadequate number of  viral copies(<138 copies/mL). A negative result must be combined with clinical observations, patient history, and epidemiological information. The expected result is Negative.  Fact Sheet for Patients:  EntrepreneurPulse.com.au  Fact Sheet for Healthcare Providers:  IncredibleEmployment.be  This test is no t yet approved or cleared by the [place] FDA and  has been authorized for detection and/or diagnosis of SARS-CoV-2 by FDA under an Emergency Use Authorization (EUA). This EUA will remain  in effect (meaning this test can be used) for the duration of the COVID-19 declaration under Section 564(b)(1) of the Act, 21 U.S.C.section 360bbb-3(b)(1), unless the authorization is terminated  or revoked sooner.       Influenza A by PCR NEGATIVE NEGATIVE Final   Influenza B by PCR NEGATIVE NEGATIVE Final    Comment: (NOTE) The Xpert Xpress SARS-CoV-2/FLU/RSV plus assay is intended as an aid in the diagnosis of  influenza from Nasopharyngeal swab specimens and should not be used as a sole basis for treatment. Nasal washings and aspirates are unacceptable for Xpert [facility] SARS-CoV-2/FLU/RSV testing.  Fact Sheet for Patients: EntrepreneurPulse.com.au  Fact Sheet for Healthcare Providers: IncredibleEmployment.be  This test is not yet approved or cleared by the [place] FDA and has been authorized for detection and/or diagnosis of SARS-CoV-2 by FDA under an Emergency Use Authorization (EUA). This EUA will remain in effect (meaning this test can be used) for the duration of the COVID-19 declaration under Section 564(b)(1) of the Act, 21 U.S.C. section 360bbb-3(b)(1), unless the authorization is terminated or revoked.  Performed at [facility], [place] [address], [place], [place] [address]   Culture, blood (Routine X 2) w Reflex to ID Panel     Status: None (Preliminary result)   Collection  Time: 10/04/21  2:22 PM   Specimen: BLOOD  Result Value Ref Range Status   Specimen Description   Final    BLOOD BLOOD LEFT FOREARM Performed at [facility] [address], [place], [place] [address]    Special Requests   Final    BOTTLES DRAWN AEROBIC ONLY Blood Culture adequate volume Performed at [facility] [address] [place], [place] [address]    Culture   Final    NO GROWTH < 24 HOURS Performed at [facility] [address], [place], [place] [address]    Report Status PENDING  Incomplete  Culture, blood (Routine X 2) w Reflex to ID Panel     Status: None (Preliminary result)   Collection Time: 10/04/21  2:23 PM   Specimen: BLOOD  Result Value Ref Range Status   Specimen Description   Final    BLOOD LEFT ANTECUBITAL Performed at [facility] [address], [place], [place] [address]    Special Requests   Final    BOTTLES DRAWN AEROBIC ONLY Blood Culture adequate volume Performed at [facility] [address] [place], [place] [address]    Culture   Final    NO GROWTH < 24 HOURS Performed at [facility] [address], [place], [place] [address]    Report Status PENDING  Incomplete     Labs: Basic Metabolic Panel: Recent Labs  Lab 10/02/21 1806 10/02/21 1832 10/04/21 0347 10/05/21 0529  NA 140 139 136 138  K 3.3* 3.3* 3.2* 4.1  CL 108 101 104 107  CO2 23  --  26 26  GLUCOSE 96 98 95 89  BUN 6 3* 6 5*  CREATININE 0.49 0.50 0.57 0.63  CALCIUM 8.9  --  8.4* 8.2*  MG  --   --  1.7 2.0  PHOS  --   --   --  2.5   Liver Function Tests: Recent Labs  Lab 10/02/21 1806 10/04/21 0347 10/05/21 0529  AST '17 15 16  '$ ALT '21 18 17  '$ ALKPHOS 51 49 53  BILITOT 1.5* 0.8 0.7  PROT 6.6 5.6* 5.8*  ALBUMIN 3.8 3.1* 3.1*   CBC: Recent Labs  Lab 10/02/21 1806 10/02/21 1832 10/04/21 0347 10/05/21 0529  WBC 4.3  --  1.7* 8.1  NEUTROABS 2.8  --   --  4.7  HGB 12.7  11.9* 11.1* 11.2*  HCT 36.4 35.0* 31.9* 32.0*  MCV 89.4  --  90.4 91.2  PLT 122*  --  132* 147*   CBG: Recent Labs  Lab 10/03/21 0055  GLUCAP 103*  Hgb A1c No results for input(s): "HGBA1C" in the last 72 hours. Lipid Profile No results for input(s): "CHOL", "HDL", "LDLCALC", "TRIG", "CHOLHDL", "LDLDIRECT" in the last 72 hours. Thyroid function studies No results for input(s): "TSH", "T4TOTAL", "T3FREE", "THYROIDAB" in the last 72 hours.  Invalid input(s): "FREET3" Urinalysis    Component Value Date/Time   COLORURINE STRAW (A) 10/02/2021 [place] 10/02/2021 1835   LABSPEC 1.002 (L) 10/02/2021 1835   PHURINE 7.0 10/02/2021 [place] 10/02/2021 [place] (A) 10/02/2021 1835   BILIRUBINUR NEGATIVE 10/02/2021 [place] 10/02/2021 1835   PROTEINUR NEGATIVE 10/02/2021 1835   UROBILINOGEN 0.2 11/07/2006 1207   NITRITE NEGATIVE 10/02/2021 [place] 10/02/2021 1835    FURTHER DISCHARGE INSTRUCTIONS:   Get Medicines reviewed and adjusted: Please take all your medications with you for your next visit with your Primary MD   Laboratory/radiological data: Please request your Primary MD to go over all hospital tests and procedure/radiological results at the follow up, please ask your Primary MD to get all Hospital records sent to his/her office.   In some cases, they will be blood work, cultures and biopsy results pending at the time of your discharge. Please request that your primary care M.D. goes through all the records of your hospital data and follows up on these results.   Also Note the following: If you experience worsening of your admission symptoms, develop shortness of breath, life threatening emergency, suicidal or homicidal thoughts you must seek medical attention immediately by calling 911 or calling your MD immediately  if symptoms less severe.   You must read complete instructions/literature  along with all the possible adverse reactions/side effects for all the Medicines you take and that have been prescribed to you. Take any new Medicines after you have completely understood and accpet all the possible adverse reactions/side effects.    Do not drive when taking Pain medications or sleeping medications (Benzodaizepines)   Do not take more than prescribed Pain, Sleep and Anxiety Medications. It is not advisable to combine anxiety,sleep and pain medications without talking with your primary care practitioner   Special Instructions: If you have smoked or chewed Tobacco  in the last 2 yrs please stop smoking, stop any regular Alcohol  and or any Recreational drug use.   Wear Seat belts while driving.   Please note: You were cared for by a hospitalist during your hospital stay. Once you are discharged, your primary care physician will handle any further medical issues. Please note that NO REFILLS for any discharge medications will be authorized once you are discharged, as it is imperative that you return to your primary care physician (or establish a relationship with a primary care physician if you do not have one) for your post hospital discharge needs so that they can reassess your need for medications and monitor your lab values.  Time coordinating discharge: 40 minutes  SIGNED:  Marzetta Board, MD, PhD 10/05/2021, 12:42 PM

## 2021-10-05 NOTE — Progress Notes (Addendum)
Darlene Peters is a 51 year old female admitted for positive cultures with bacillus spp from her Port-A-Cath. Based on where the cultures were drawn, appears to be contaminant since both were growing bacillus and both cultures drawn from the port. Given repeat peripehral cultures are NGTD and overall suspicion for bacteremia is low, plan is to discontinue the vancomycin on 8/2 and finish out her therapy on linzeolid '600mg'$  q12h for 4 days per Dr. Juleen China. Price check was ran through insurance for no copay for the patient. Orders have been placed.  Sandford Craze, PharmD. [facility] Acute Care PGY-1 10/05/2021 11:32 AM

## 2021-10-05 NOTE — Progress Notes (Signed)
Pt. Discharged via wheelchair, not in distress. Discharge instruction and education discussed patient verbalized understanding. All patient belongings are with the family.

## 2021-10-06 ENCOUNTER — Other Ambulatory Visit (HOSPITAL_BASED_OUTPATIENT_CLINIC_OR_DEPARTMENT_OTHER): Payer: Self-pay

## 2021-10-06 ENCOUNTER — Encounter: Payer: Self-pay | Admitting: *Deleted

## 2021-10-07 ENCOUNTER — Encounter: Payer: Self-pay | Admitting: Hematology and Oncology

## 2021-10-08 ENCOUNTER — Other Ambulatory Visit (HOSPITAL_COMMUNITY): Payer: Self-pay

## 2021-10-09 LAB — CULTURE, BLOOD (ROUTINE X 2)
Culture: NO GROWTH
Culture: NO GROWTH
Special Requests: ADEQUATE
Special Requests: ADEQUATE

## 2021-10-11 ENCOUNTER — Telehealth: Payer: Self-pay

## 2021-10-11 NOTE — Telephone Encounter (Signed)
Notified Patient of completion of Disability Forms. Fax transmission confirmation received. Copy of Forma mailed to Patient as requested. No other needs or concerns voiced at this time.

## 2021-10-12 ENCOUNTER — Other Ambulatory Visit (HOSPITAL_BASED_OUTPATIENT_CLINIC_OR_DEPARTMENT_OTHER): Payer: Self-pay

## 2021-10-19 MED FILL — Dexamethasone Sodium Phosphate Inj 100 MG/10ML: INTRAMUSCULAR | Qty: 1 | Status: AC

## 2021-10-20 ENCOUNTER — Encounter: Payer: Self-pay | Admitting: *Deleted

## 2021-10-20 ENCOUNTER — Inpatient Hospital Stay: Payer: 59

## 2021-10-20 ENCOUNTER — Other Ambulatory Visit: Payer: Self-pay

## 2021-10-20 ENCOUNTER — Encounter: Payer: Self-pay | Admitting: Hematology and Oncology

## 2021-10-20 ENCOUNTER — Inpatient Hospital Stay (HOSPITAL_BASED_OUTPATIENT_CLINIC_OR_DEPARTMENT_OTHER): Payer: 59 | Admitting: Hematology and Oncology

## 2021-10-20 VITALS — BP 103/64 | HR 94 | Resp 18

## 2021-10-20 DIAGNOSIS — Z5189 Encounter for other specified aftercare: Secondary | ICD-10-CM | POA: Diagnosis not present

## 2021-10-20 DIAGNOSIS — C50412 Malignant neoplasm of upper-outer quadrant of left female breast: Secondary | ICD-10-CM

## 2021-10-20 DIAGNOSIS — F32A Depression, unspecified: Secondary | ICD-10-CM | POA: Diagnosis not present

## 2021-10-20 DIAGNOSIS — Z170 Estrogen receptor positive status [ER+]: Secondary | ICD-10-CM | POA: Diagnosis present

## 2021-10-20 DIAGNOSIS — Z79899 Other long term (current) drug therapy: Secondary | ICD-10-CM | POA: Diagnosis not present

## 2021-10-20 DIAGNOSIS — Z5111 Encounter for antineoplastic chemotherapy: Secondary | ICD-10-CM | POA: Diagnosis present

## 2021-10-20 DIAGNOSIS — Z800 Family history of malignant neoplasm of digestive organs: Secondary | ICD-10-CM | POA: Diagnosis not present

## 2021-10-20 DIAGNOSIS — Z803 Family history of malignant neoplasm of breast: Secondary | ICD-10-CM | POA: Diagnosis not present

## 2021-10-20 DIAGNOSIS — Z95828 Presence of other vascular implants and grafts: Secondary | ICD-10-CM

## 2021-10-20 LAB — CMP (CANCER CENTER ONLY)
ALT: 25 U/L (ref 0–44)
AST: 16 U/L (ref 15–41)
Albumin: 4.1 g/dL (ref 3.5–5.0)
Alkaline Phosphatase: 46 U/L (ref 38–126)
Anion gap: 3 — ABNORMAL LOW (ref 5–15)
BUN: 6 mg/dL (ref 6–20)
CO2: 28 mmol/L (ref 22–32)
Calcium: 9 mg/dL (ref 8.9–10.3)
Chloride: 105 mmol/L (ref 98–111)
Creatinine: 0.74 mg/dL (ref 0.44–1.00)
GFR, Estimated: >60 mL/min (ref >60)
Glucose, Bld: 99 mg/dL (ref 70–99)
Potassium: 3.7 mmol/L (ref 3.5–5.1)
Sodium: 136 mmol/L (ref 135–145)
Total Bilirubin: 1.2 mg/dL (ref 0.3–1.2)
Total Protein: 6.6 g/dL (ref 6.5–8.1)

## 2021-10-20 LAB — CBC WITH DIFFERENTIAL (CANCER CENTER ONLY)
Abs Immature Granulocytes: 0.01 10*3/uL (ref 0.00–0.07)
Basophils Absolute: 0 10*3/uL (ref 0.0–0.1)
Basophils Relative: 1 %
Eosinophils Absolute: 0 10*3/uL (ref 0.0–0.5)
Eosinophils Relative: 1 %
HCT: 33.2 % — ABNORMAL LOW (ref 36.0–46.0)
Hemoglobin: 11.6 g/dL — ABNORMAL LOW (ref 12.0–15.0)
Immature Granulocytes: 0 %
Lymphocytes Relative: 14 %
Lymphs Abs: 0.8 10*3/uL (ref 0.7–4.0)
MCH: 31.4 pg (ref 26.0–34.0)
MCHC: 34.9 g/dL (ref 30.0–36.0)
MCV: 90 fL (ref 80.0–100.0)
Monocytes Absolute: 0.5 10*3/uL (ref 0.1–1.0)
Monocytes Relative: 9 %
Neutro Abs: 4.2 10*3/uL (ref 1.7–7.7)
Neutrophils Relative %: 75 %
Platelet Count: 292 10*3/uL (ref 150–400)
RBC: 3.69 MIL/uL — ABNORMAL LOW (ref 3.87–5.11)
RDW: 13.5 % (ref 11.5–15.5)
WBC Count: 5.6 10*3/uL (ref 4.0–10.5)
nRBC: 0 % (ref 0.0–0.2)

## 2021-10-20 MED ORDER — SODIUM CHLORIDE 0.9% FLUSH
10.0000 mL | Freq: Once | INTRAVENOUS | Status: AC
  Administered 2021-10-20: 10 mL

## 2021-10-20 MED ORDER — FAMOTIDINE IN NACL 20-0.9 MG/50ML-% IV SOLN
20.0000 mg | Freq: Once | INTRAVENOUS | Status: AC
  Administered 2021-10-20: 20 mg via INTRAVENOUS
  Filled 2021-10-20: qty 50

## 2021-10-20 MED ORDER — SODIUM CHLORIDE 0.9 % IV SOLN
10.0000 mg | Freq: Once | INTRAVENOUS | Status: AC
  Administered 2021-10-20: 10 mg via INTRAVENOUS
  Filled 2021-10-20: qty 10

## 2021-10-20 MED ORDER — SODIUM CHLORIDE 0.9 % IV SOLN
600.0000 mg/m2 | Freq: Once | INTRAVENOUS | Status: AC
  Administered 2021-10-20: 1260 mg via INTRAVENOUS
  Filled 2021-10-20: qty 50

## 2021-10-20 MED ORDER — DIPHENHYDRAMINE HCL 50 MG/ML IJ SOLN
50.0000 mg | Freq: Once | INTRAMUSCULAR | Status: AC
  Administered 2021-10-20: 50 mg via INTRAVENOUS
  Filled 2021-10-20: qty 1

## 2021-10-20 MED ORDER — PALONOSETRON HCL INJECTION 0.25 MG/5ML
0.2500 mg | Freq: Once | INTRAVENOUS | Status: AC
  Administered 2021-10-20: 0.25 mg via INTRAVENOUS
  Filled 2021-10-20: qty 5

## 2021-10-20 MED ORDER — SODIUM CHLORIDE 0.9 % IV SOLN
75.0000 mg/m2 | Freq: Once | INTRAVENOUS | Status: AC
  Administered 2021-10-20: 160 mg via INTRAVENOUS
  Filled 2021-10-20: qty 16

## 2021-10-20 MED ORDER — HEPARIN SOD (PORK) LOCK FLUSH 100 UNIT/ML IV SOLN
500.0000 [IU] | Freq: Once | INTRAVENOUS | Status: AC | PRN
  Administered 2021-10-20: 500 [IU]

## 2021-10-20 MED ORDER — SODIUM CHLORIDE 0.9% FLUSH
10.0000 mL | INTRAVENOUS | Status: DC | PRN
  Administered 2021-10-20: 10 mL

## 2021-10-20 MED ORDER — PEGFILGRASTIM 6 MG/0.6ML ~~LOC~~ PSKT
6.0000 mg | PREFILLED_SYRINGE | Freq: Once | SUBCUTANEOUS | Status: AC
  Administered 2021-10-20: 6 mg via SUBCUTANEOUS
  Filled 2021-10-20: qty 0.6

## 2021-10-20 MED ORDER — SODIUM CHLORIDE 0.9 % IV SOLN: Freq: Once | INTRAVENOUS | Status: AC

## 2021-10-20 NOTE — Assessment & Plan Note (Signed)
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  She is now postsurgery and is here to discuss Oncotype results and role of chemotherapy.  We have discussed final pathology as well as Oncotype score which resulted at 28, distant recurrence risk at 9 years of 17%, absolute benefit of chemotherapy greater than 15%.  Given young age, excellent PS, Oncotype score 28, we have discussed about adjuvant chemotherapy with TC every 21 days for 4 cycles.  We have discussed about adverse effects of chemotherapy including but not limited to fatigue, nausea, alopecia vomiting, increased risk of infections, neuropathy etc.  She understands that some of the side effects can be permanent.    After cycle 1 of chemotherapy, she was briefly admitted for syncope, diarrhea and had some positive blood cultures which were thought to be contaminant. She is here before planned cycle 2 of chemotherapy.  She does not report any ongoing fevers or chills but has noticed dizziness once again along with the pressure in her head.  She has a mild cough.  Physical examination unremarkable.  Lungs are clear to auscultation.  We have discussed that the head pressure and a mild cough could be related to allergies.  She should try some Xyzal and decongestant.  I do not believe this is related to her breast cancer necessarily but I recommended that she follow-up on the symptoms and keep [place] posted if they continue to bother her.  She expressed understanding. I have offered dose reduction however she wants to proceed with full dose today. CBC reviewed, satisfactory, CMP pending at the time.  Return to clinic in 3 weeks before planned cycle 3 of chemotherapy

## 2021-10-20 NOTE — Patient Instructions (Addendum)
[facility]  Discharge Instructions: Thank you for choosing [facility] to provide your oncology and hematology care.   If you have a lab appointment with the [facility], please go directly to the [facility] and check in at the registration area.   Wear comfortable clothing and clothing appropriate for easy access to any Portacath or PICC line.   We strive to give you quality time with your provider. You may need to reschedule your appointment if you arrive late (15 or more minutes).  Arriving late affects you and other patients whose appointments are after yours.  Also, if you miss three or more appointments without notifying the office, you may be dismissed from the clinic at the provider's discretion.      For prescription refill requests, have your pharmacy contact our office and allow 72 hours for refills to be completed.    Today you received the following chemotherapy and/or immunotherapy agents: Docetaxel and Cytoxan      To help prevent nausea and vomiting after your treatment, we encourage you to take your nausea medication as directed.  BELOW ARE SYMPTOMS THAT SHOULD BE REPORTED IMMEDIATELY: *FEVER GREATER THAN 100.4 F (38 C) OR HIGHER *CHILLS OR SWEATING *NAUSEA AND VOMITING THAT IS NOT CONTROLLED WITH YOUR NAUSEA MEDICATION *UNUSUAL SHORTNESS OF BREATH *UNUSUAL BRUISING OR BLEEDING *URINARY PROBLEMS (pain or burning when urinating, or frequent urination) *BOWEL PROBLEMS (unusual diarrhea, constipation, pain near the anus) TENDERNESS IN MOUTH AND THROAT WITH OR WITHOUT PRESENCE OF ULCERS (sore throat, sores in mouth, or a toothache) UNUSUAL RASH, SWELLING OR PAIN  UNUSUAL VAGINAL DISCHARGE OR ITCHING   Items with * indicate a potential emergency and should be followed up as soon as possible or go to the Emergency Department if any problems should occur.  Please show the CHEMOTHERAPY ALERT CARD or IMMUNOTHERAPY ALERT CARD at  check-in to the Emergency Department and triage nurse.  Should you have questions after your visit or need to cancel or reschedule your appointment, please contact [facility]  Dept: [phone]  and follow the prompts.  Office hours are 8:00 a.m. to 4:30 p.m. Monday - Friday. Please note that voicemails left after 4:00 p.m. may not be returned until the following business day.  We are closed weekends and major holidays. You have access to a nurse at all times for urgent questions. Please call the main number to the clinic Dept: [phone] and follow the prompts.   For any non-urgent questions, you may also contact your provider using MyChart. We now offer e-Visits for anyone 22 and older to request care online for non-urgent symptoms. For details visit [email or web address].   Also download the MyChart app! Go to the app store, search "MyChart", open the app, select [facility], and log in with your MyChart username and password.  Masks are optional in the cancer centers. If you would like for your care team to wear a mask while they are taking care of you, please let them know. You may have one support person who is at least 52 years old accompany you for your appointments.

## 2021-10-20 NOTE — Progress Notes (Signed)
Infused pt docetaxel as first time today d/t previous reaction, tolerated treatment well.

## 2021-10-20 NOTE — Progress Notes (Signed)
Dr. Gilberto Better is [facility] [place] CONSULT NOTE  Patient Care Team: [name], MD as PCP - General (Obstetrics and Gynecology) Rockwell Germany, RN as Oncology Nurse Navigator Tressie Ellis, Paulette Blanch, RN as Oncology Nurse Navigator Benay Pike, MD as Consulting Physician (Hematology and Oncology) Jovita Kussmaul, MD as Consulting Physician (General Surgery) Eppie Gibson, MD as Attending Physician (Radiation Oncology)  CHIEF COMPLAINTS/PURPOSE OF CONSULTATION:  Newly diagnosed breast cancer  HISTORY OF PRESENTING ILLNESS:  Darlene Peters 51 y.o. female is here because of recent diagnosis of left breast cancer  I reviewed her records extensively and collaborated the history with the patient.  SUMMARY OF ONCOLOGIC HISTORY: Oncology History  Malignant neoplasm of upper-outer quadrant of left breast in female, estrogen receptor positive ([place])  06/14/2021 Initial Diagnosis   Malignant neoplasm of upper-outer quadrant of left breast in female, estrogen receptor positive ([place])   07/26/2021 Genetic Testing   Negative hereditary cancer genetic testing: no pathogenic variants detected in [facility] BRCAPlus Panel and CancerNext +RNAinsight Panel.  Report dates are Jul 26, 2021 and August 08, 2021.   The BRCAplus panel offered by [employer] and includes sequencing and deletion/duplication analysis for the following 8 genes: ATM, BRCA1, BRCA2, CDH1, CHEK2, PALB2, PTEN, and TP53.  The CancerNext gene panel offered by [employer] includes sequencing, rearrangement analysis, and RNA analysis for the following 36 genes:   APC, ATM, AXIN2, BARD1, BMPR1A, BRCA1, BRCA2, BRIP1, CDH1, CDK4, CDKN2A, CHEK2, DICER1, HOXB13, EPCAM, GREM1, MLH1, MSH2, MSH3, MSH6, MUTYH, NBN, NF1, NTHL1, PALB2, PMS2, POLD1, POLE, PTEN, RAD51C, RAD51D, RECQL, SMAD4, SMARCA4, STK11, and TP53.    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 Oncotype score of 28, distant recurrence risk at 9 years of 17%, absolute benefit of chemotherapy greater than 15% on Oncotype she tested positive for ER and PR and HER2 negative   09/28/2021 -  Chemotherapy   Patient is on Treatment Plan : BREAST TC q21d     She works at [employer]. Husband is estranged. She has 4 kids, one daughter and three son. Daughters are 35, sons are 34, 84 and 105. She has 4 grandchildren. Oldest son lives here.    Interval History  She is here before anticipated second cycle of docetaxel and cyclophosphamide. After first cycle she was briefly admitted for diarrhea, lightheadedness, syncopal episode.  She had positive blood cultures however this was thought to be contaminant.  She was discharged after recovery and she is here for second cycle of chemotherapy. She felt well except last night and this morning when she started feeling dizziness and a pressure in the head. No sinus issues. She has some cough, no fevers or chills.  She wonders if this could be related to her allergies although her allergies usually present with sneezing. She describes the pressure in her head as a transient sensation.  She does not know if this could be from her environmental allergies. No change in bowel habits or urinary habits. Rest of the pertinent 10 point ROS reviewed and negative.  MEDICAL HISTORY:  Past Medical History:  Diagnosis Date   Asthma    "worse when lying down"   Breast cancer [facility])    Family history of breast cancer 07/19/2021   Family history of prostate cancer 07/19/2021  GERD (gastroesophageal reflux disease)    Psoriasis    Urticaria     SURGICAL HISTORY: Past Surgical History:  Procedure Laterality Date   ABDOMINAL HYSTERECTOMY     partial   BREAST BIOPSY Left 05/26/2021   BREAST LUMPECTOMY WITH  RADIOACTIVE SEED AND SENTINEL LYMPH NODE BIOPSY Left 08/03/2021   Procedure: LEFT BREAST LUMPECTOMY WITH RADIOACTIVE SEED AND SENTINEL LYMPH NODE BIOPSY;  Surgeon: Jovita Kussmaul, MD;  Location: [place];  Service: General;  Laterality: Left;   IR IMAGING GUIDED PORT INSERTION  09/26/2021   RADIOACTIVE SEED GUIDED AXILLARY SENTINEL LYMPH NODE Left 08/03/2021   Procedure: RADIOACTIVE SEED GUIDED LEFT AXILLARY SENTINEL LYMPH NODE DISSECTION;  Surgeon: Jovita Kussmaul, MD;  Location: [place];  Service: General;  Laterality: Left;   TUMOR REMOVAL Left 08/2021   breast    SOCIAL HISTORY: Social History   Socioeconomic History   Marital status: Married    Spouse name: Not on file   Number of children: Not on file   Years of education: Not on file   Highest education level: Not on file  Occupational History   Not on file  Tobacco Use   Smoking status: Never    Passive exposure: Never   Smokeless tobacco: Never  Vaping Use   Vaping Use: Never used  Substance and Sexual Activity   Alcohol use: No   Drug use: No   Sexual activity: Yes    Birth control/protection: Surgical  Other Topics Concern   Not on file  Social History Narrative   Not on file   Social Determinants of Health   Financial Resource Strain: High Risk (07/14/2021)   Overall Financial Resource Strain (CARDIA)    Difficulty of Paying Living Expenses: Hard  Food Insecurity: Not on file  Transportation Needs: Unmet Transportation Needs (10/19/2021)   PRAPARE - Transportation    Lack of Transportation (Medical): Yes    Lack of Transportation (Non-Medical): No  Physical Activity: Not on file  Stress: Not on file  Social Connections: Not on file  Intimate Partner Violence: Not on file    FAMILY HISTORY: Family History  Problem Relation Age of Onset   Allergic rhinitis Mother    Allergic rhinitis Sister    Allergic rhinitis Sister    Breast cancer Maternal Aunt 45   Prostate  cancer Maternal Uncle        mets; dx after 50   Prostate cancer Paternal Uncle    Leukemia Paternal Uncle    Prostate cancer Paternal Uncle 42   Gastric cancer Paternal Uncle    Skin cancer Maternal Grandmother 55   Breast cancer Cousin 1       maternal cousin   Cervical cancer Cousin    Breast cancer Cousin        dx 23s; maternal female cousin   Breast cancer Cousin        paternal female cousin x2    ALLERGIES:  is allergic to shellfish allergy, fish allergy, gluten meal, latex, other, and docetaxel.  MEDICATIONS:  Current Outpatient Medications  Medication Sig Dispense Refill   Acetaminophen (TYLENOL 8 HOUR PO) Take 1 tablet by mouth every 6 (six) hours as needed (pain).     albuterol (PROVENTIL HFA) 108 (90 Base) MCG/ACT inhaler Inhale 2 puffs into the lungs every 4 (four) hours as needed for wheezing or shortness of breath. 8.5 g 2   buPROPion (WELLBUTRIN XL) 150 MG 24 hr tablet Take one tablet by  mouth in the morning along with the $Remove'300mg'[id number]$  dose. 30 tablet 0   buPROPion (WELLBUTRIN XL) 300 MG 24 hr tablet Take 1 tablet  by mouth daily. 30 tablet 0   clobetasol (TEMOVATE) 0.05 % external solution Apply topically to affected areas twice daily as needed (not to face,groin,or axilla) 50 mL 3   desonide (DESOWEN) 0.05 % lotion Apply topically to affected areas twice daily as needed (not to face,groin,or axilla) 60 mL 3   dexamethasone (DECADRON) 4 MG tablet Take 2 tablets (8 mg total) by mouth 2 (two) times daily. Start the day before Taxotere. Then again the day after chemo for 3 days. 30 tablet 1   docusate sodium (COLACE) 100 MG capsule Take 1 capsule (100 mg total) by mouth every 12 (twelve) hours. 60 capsule 0   gabapentin (NEURONTIN) 100 MG capsule Take 1 capsule (100 mg total) by mouth 3 (three) times daily. 90 capsule 2   levocetirizine (XYZAL) 5 MG tablet Take 1 tablet (5 mg total) by mouth every evening. 30 tablet 5   lidocaine-prilocaine (EMLA) cream Apply to affected area  once. 30 g 3   ondansetron (ZOFRAN) 8 MG tablet Take 1 tablet (8 mg total) by mouth 2 (two) times daily as needed for refractory nausea / vomiting. Start on day 3 after chemo. 30 tablet 1   polyethylene glycol (MIRALAX / GLYCOLAX) 17 g packet Take 17 g by mouth daily. 14 each 0   prochlorperazine (COMPAZINE) 10 MG tablet Take 1 tablet (10 mg total) by mouth every 6 (six) hours as needed for nausea or vomiting. 30 tablet 1   triamcinolone cream (KENALOG) 0.1 % Apply topically to affected areas twice daily as needed (not to face,groin,or axilla) (Patient not taking: Reported on 10/02/2021) 453.6 g 3   No current facility-administered medications for this visit.    REVIEW OF SYSTEMS:   Constitutional: Denies fevers, chills or abnormal night sweats Eyes: Denies blurriness of vision, double vision or watery eyes Ears, nose, mouth, throat, and face: Denies mucositis or sore throat Respiratory: Denies cough, dyspnea or wheezes Cardiovascular: Denies palpitation, chest discomfort or lower extremity swelling Gastrointestinal:  Denies nausea, heartburn or change in bowel habits Skin: Denies abnormal skin rashes Lymphatics: Denies new lymphadenopathy or easy bruising Neurological:Denies numbness, tingling or new weaknesses Behavioral/Psych: Mood is stable, no new changes  Breast: Noticed the dent in the breast along with some nipple retraction in the left breast All other systems were reviewed with the patient and are negative.  PHYSICAL EXAMINATION: ECOG PERFORMANCE STATUS: 0 - Asymptomatic  Vitals:   10/20/21 0840  BP: 121/76  Pulse: 96  Resp: 18  Temp: 98.1 F (36.7 C)  SpO2: 99%    Filed Weights   10/20/21 0840  Weight: 201 lb 3.2 oz (91.3 kg)     Physical Exam Constitutional:      General: She is not in acute distress. HENT:     Head: Normocephalic and atraumatic.  Cardiovascular:     Rate and Rhythm: Normal rate and regular rhythm.  Pulmonary:     Effort: Pulmonary effort  is normal.     Breath sounds: Normal breath sounds.  Chest:     Comments: Left breast and left axilla appears to be healing well.  Port site appears well Musculoskeletal:     Cervical back: Normal range of motion and neck supple. No rigidity.  Lymphadenopathy:     Cervical: No cervical adenopathy.  Skin:    General: Skin is warm and  dry.  Neurological:     General: No focal deficit present.     Mental Status: She is alert.      LABORATORY DATA:  I have reviewed the data as listed Lab Results  Component Value Date   WBC 5.6 10/20/2021   HGB 11.6 (L) 10/20/2021   HCT 33.2 (L) 10/20/2021   MCV 90.0 10/20/2021   PLT 292 10/20/2021   Lab Results  Component Value Date   NA 136 10/20/2021   K 3.7 10/20/2021   CL 105 10/20/2021   CO2 28 10/20/2021    RADIOGRAPHIC STUDIES: I have personally reviewed the radiological reports and agreed with the findings in the report.  ASSESSMENT AND PLAN:  Malignant neoplasm of upper-outer quadrant of left breast in female, estrogen receptor positive ([place]) This is a 51 year old female patient with newly diagnosed left breast invasive ductal carcinoma, grade 2, ER 15% moderate staining, PR negative, HER2 negative, KI of 2% referred to medical oncology for adjuvant recommendations.  She is now postsurgery and is here to discuss Oncotype results and role of chemotherapy.  We have discussed final pathology as well as Oncotype score which resulted at 28, distant recurrence risk at 9 years of 17%, absolute benefit of chemotherapy greater than 15%.  Given young age, excellent PS, Oncotype score 28, we have discussed about adjuvant chemotherapy with TC every 21 days for 4 cycles.  We have discussed about adverse effects of chemotherapy including but not limited to fatigue, nausea, alopecia vomiting, increased risk of infections, neuropathy etc.  She understands that some of the side effects can be permanent.    After cycle 1 of chemotherapy, she was  briefly admitted for syncope, diarrhea and had some positive blood cultures which were thought to be contaminant. She is here before planned cycle 2 of chemotherapy.  She does not report any ongoing fevers or chills but has noticed dizziness once again along with the pressure in her head.  She has a mild cough.  Physical examination unremarkable.  Lungs are clear to auscultation.  We have discussed that the head pressure and a mild cough could be related to allergies.  She should try some Xyzal and decongestant.  I do not believe this is related to her breast cancer necessarily but I recommended that she follow-up on the symptoms and keep [place] posted if they continue to bother her.  She expressed understanding. I have offered dose reduction however she wants to proceed with full dose today. CBC reviewed, satisfactory, CMP pending at the time.  Return to clinic in 3 weeks before planned cycle 3 of chemotherapy   Total time spent: 30 minutes. All questions were answered. The patient knows to call the clinic with any problems, questions or concerns.    Benay Pike, MD 10/20/21

## 2021-10-21 ENCOUNTER — Other Ambulatory Visit (HOSPITAL_BASED_OUTPATIENT_CLINIC_OR_DEPARTMENT_OTHER): Payer: Self-pay

## 2021-10-21 ENCOUNTER — Other Ambulatory Visit: Payer: Self-pay

## 2021-10-21 ENCOUNTER — Other Ambulatory Visit: Payer: Self-pay | Admitting: *Deleted

## 2021-10-21 ENCOUNTER — Encounter: Payer: Self-pay | Admitting: Hematology and Oncology

## 2021-10-21 MED ORDER — FAMOTIDINE 20 MG PO TABS
20.0000 mg | ORAL_TABLET | Freq: Two times a day (BID) | ORAL | 3 refills | Status: AC
  Filled 2021-10-21: qty 60, 30d supply, fill #0
  Filled 2021-11-09: qty 60, 30d supply, fill #1
  Filled 2022-01-04: qty 60, 30d supply, fill #2

## 2021-10-22 ENCOUNTER — Other Ambulatory Visit: Payer: Self-pay

## 2021-10-22 ENCOUNTER — Inpatient Hospital Stay: Payer: 59

## 2021-10-26 ENCOUNTER — Telehealth: Payer: Self-pay | Admitting: *Deleted

## 2021-10-26 NOTE — Telephone Encounter (Signed)
This RN spoke with pt per her call stating onset of "bleeding from my bottom"  She states slight blood noted on tissue post voiding- and then she is able to wipe and note it is from her rectum.  She denies any clots on tissue, or blood in the toilet. She denies any bleeding in her underwear.  She states she is more constipated and having hard stools.  She has had 4 children.  Per above - discussed likely mucus membranes or or internal hemorroids ( which a lot of women have post child bearing and are unaware of ) are irritated due

## 2021-10-27 ENCOUNTER — Ambulatory Visit: Payer: 59

## 2021-10-28 ENCOUNTER — Other Ambulatory Visit (HOSPITAL_BASED_OUTPATIENT_CLINIC_OR_DEPARTMENT_OTHER): Payer: Self-pay

## 2021-11-01 ENCOUNTER — Other Ambulatory Visit: Payer: Self-pay | Admitting: Hematology and Oncology

## 2021-11-01 ENCOUNTER — Ambulatory Visit: Payer: 59

## 2021-11-03 ENCOUNTER — Ambulatory Visit: Payer: 59 | Attending: General Surgery

## 2021-11-03 DIAGNOSIS — R600 Localized edema: Secondary | ICD-10-CM | POA: Insufficient documentation

## 2021-11-03 DIAGNOSIS — M25612 Stiffness of left shoulder, not elsewhere classified: Secondary | ICD-10-CM | POA: Insufficient documentation

## 2021-11-03 DIAGNOSIS — Z170 Estrogen receptor positive status [ER+]: Secondary | ICD-10-CM | POA: Insufficient documentation

## 2021-11-03 DIAGNOSIS — C50412 Malignant neoplasm of upper-outer quadrant of left female breast: Secondary | ICD-10-CM | POA: Insufficient documentation

## 2021-11-03 DIAGNOSIS — R293 Abnormal posture: Secondary | ICD-10-CM | POA: Insufficient documentation

## 2021-11-03 NOTE — Therapy (Signed)
OUTPATIENT PHYSICAL THERAPY BREAST CANCER RE-EVALUATION   Patient Name: Darlene Peters MRN: [id number] DOB:October 12, 1970, 51 y.o., female Today's Date: 11/03/2021   PT End of Session - 11/03/21 1653     Visit Number 7    Number of Visits 14    Date for PT Re-Evaluation 11/03/21    PT Start Time 1603    PT Stop Time 1654    PT Time Calculation (min) 51 min    Activity Tolerance Patient tolerated treatment well    Behavior During Therapy [facility] for tasks assessed/performed              Past Medical History:  Diagnosis Date   Asthma    "worse when lying down"   Breast cancer ([place])    Family history of breast cancer 07/19/2021   Family history of prostate cancer 07/19/2021   GERD (gastroesophageal reflux disease)    Psoriasis    Urticaria    Past Surgical History:  Procedure Laterality Date   ABDOMINAL HYSTERECTOMY     partial   BREAST BIOPSY Left 05/26/2021   BREAST LUMPECTOMY WITH RADIOACTIVE SEED AND SENTINEL LYMPH NODE BIOPSY Left 08/03/2021   Procedure: LEFT BREAST LUMPECTOMY WITH RADIOACTIVE SEED AND SENTINEL LYMPH NODE BIOPSY;  Surgeon: Jovita Kussmaul, MD;  Location: [place];  Service: General;  Laterality: Left;   IR IMAGING GUIDED PORT INSERTION  09/26/2021   RADIOACTIVE SEED GUIDED AXILLARY SENTINEL LYMPH NODE Left 08/03/2021   Procedure: RADIOACTIVE SEED GUIDED LEFT AXILLARY SENTINEL LYMPH NODE DISSECTION;  Surgeon: Jovita Kussmaul, MD;  Location: [place];  Service: General;  Laterality: Left;   TUMOR REMOVAL Left 08/2021   breast   Patient Active Problem List   Diagnosis Date Noted   Port-A-Cath in place 10/20/2021   Bacteremia 10/03/2021   Syncope 10/03/2021   Constipation 10/03/2021   Genetic testing 07/27/2021   Family history of breast cancer 07/19/2021   Family history of prostate cancer 07/19/2021   Anxiety 06/22/2021   Difficulty sleeping 06/22/2021   Major depressive disorder, single episode, unspecified  06/22/2021   Malignant neoplasm of upper-outer quadrant of left breast in female, estrogen receptor positive ([place]) 06/14/2021   Other fatigue 11/05/2019   Psoriasis 10/14/2019   Gastroesophageal reflux disease without esophagitis 05/01/2019   Intermittent asthma 07/05/2018   Low back pain 01/11/2017    PCP: Suzanna Obey, MD  REFERRING PROVIDER: Jovita Kussmaul, MD  REFERRING DIAG: Left Breast Cancer  THERAPY DIAG:  Stiffness of left shoulder, not elsewhere classified  Localized edema  Abnormal posture  Malignant neoplasm of upper-outer quadrant of left breast in female, estrogen receptor positive ([place])  Rationale for Evaluation and Treatment Rehabilitation  ONSET DATE: 05/31/2021  SUBJECTIVE:  SUBJECTIVE STATEMENT: I was hospitalized for 5 days in August with bacteria in my blood. I was having really bad pain and I passed out in the shower.. I went to the ED and was admitted. I feel a little pull in the axillary region with ROM but nothing bad. My breast is back to normal now and there isn't much pain now. I still can't lift heavy things. I can do most things at home now. Lateral breast pain is gone but a new pain occurred that happens every 3 hours at the medial breast and shoots through then goes away. The MD said that is normal.  I have 2 more chemos left and then start radiation in November. I feel so much better about everything. I am ready to be discharged.    PERTINENT HISTORY:  Patient was diagnosed on 05/31/2021 with left Breast Cancer. It measures 2.6 cm and is located in the upper-outer quadrant. It is ER+,PR-, HER 2 - with a Ki67 of <5%.  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 0/6 LN  PATIENT GOALS:   Reassess how my recovery is going related to arm function, pain, and swelling.  PAIN:  Are you having pain? Yes: NPRS scale:0- 2/10 Pain location: left breast Pain description: intermittent sharp pain occurs nearly every 3 hours. Aggravating factors: nothing Relieving factors: nothing  PRECAUTIONS: Recent Surgery, left UE Lymphedema risk, Other: back pain  ACTIVITY LEVEL / LEISURE: cleaning with right hand,bathing,   OBJECTIVE:   PATIENT SURVEYS:  QUICK DASH: 08/25/2021  82% 11/03/2021  30% OBSERVATIONS:  11/03/2021 Incisions healed. No visible swelling at chest or axilla No axillary cording  08/25/2021  Scar tissue noted under axillary incision, scabs still present at both incisions so not ready for scar massage yet, generalized swelling at left breast. Axillary cording noted PALPATION: Mild tenderness left breast incision area, left axillary border of pectorals  POSTURE:  Forward head rounded shoulders  LYMPHEDEMA ASSESSMENT:     UPPER EXTREMITY AROM/PROM:   A/PROM RIGHT  06/20/2021    Shoulder extension 62  Shoulder flexion 150  Shoulder abduction 165  Shoulder internal rotation 75  Shoulder external rotation 105                          (Blank rows = not tested)   A/PROM LEFT  06/20/2021 LEFT  08/25/2021 L 09/20/21 11/03/2021  Shoulder extension 38, back of arm 32 sore 56 60  Shoulder flexion 130 118 sore/pain 160 155  Shoulder abduction 145 105 161 170  Shoulder internal rotation NT  55 70  Shoulder external rotation 80 sharp pains in neck 75 85 104                          (Blank rows = not tested)     CERVICAL AROM:  WNL         UPPER EXTREMITY STRENGTH: WNL     LYMPHEDEMA ASSESSMENTS:    LANDMARK RIGHT  06/20/2021  10 cm proximal to olecranon process 31.0  Olecranon process 25.7  10 cm proximal to ulnar styloid process 20.3  Just proximal to ulnar styloid process 15.1  Across hand at thumb web space 20.1  At base of 2nd digit 5.9  (Blank rows =  not tested)   LANDMARK LEFT  06/20/2021 Left 08/25/2021   10 cm proximal to  olecranon process 30.2 30.0 30.0  Olecranon process 26.1 25.1 24.9  10 cm proximal to ulnar styloid process 19.1 18.8 18.8  Just proximal to ulnar styloid process 14.7 14.6 14.3  Across hand at thumb web space 19.3 18.9 19.0  At base of 2nd digit 6.0 5.7 5.8  (Blank rows = not tested)      Surgery type/Date:08/03/2021 Left Lumpectomy with deep axillary SLNB Number of lymph nodes removed: 0/6 Current/past treatment (chemo, radiation, hormone therapy): will have radiation Other symptoms:  Heaviness/tightness Yesleft breast,  11/03/2021 no longer feels heavy in breast Pain Yesintermittent left breast Pitting edema No Infections No Decreased scar mobility Yes,  8/05/04/2021 scar mobility improved Stemmer sign No     TREATMENT TODAY  11/03/2021 Reevaluated pt for DC or further treatment. Pt demonstrates good left shoulder ROM. There is no axillary cording and no visible left breast swelling. She feels comfortable with MLD and her HEP. She was educated in supine scapular series and was able to return demonstrate after instruction. She will start with yellow x 5 reps and progress reps to ten, working up to 2 x 10. She will continue ROM as she goes through radiation and will wear her compression bra as long as possible during radiation. She feels ready for DC but was reminded that if she feels she needs [place] she can always return with a new referral    09/20/21- MLD: short neck, 5 diaphragmatic breaths, right axillary nodes and establishment of interaxillary pathway, left inguinal nodes and establishment of axillo inguinal pathway, L breast moving fluid towards pathways then retracing all steps. Instructed pt throughout and had pt return demonstrate all steps while providing verbal and tactile cues for correct technique.   09/15/21- MFR: to cording in L axilla extending down to forearm with numerous cords palpable, cording in  axilla very thick but by end of session was much improved   PROM: to L shoulder in to abduction and flexion  09/13/21- MLD: short neck, 5 diaphragmatic breaths, right axillary nodes and establishment of interaxillary pathway, left inguinal nodes and establishment of axillo inguinal pathway, L breast moving fluid towards pathways then retracing all steps MFR: to cording in L axilla extending down to forearm    PATIENT EDUCATION:  Education details:self MLD, scar mobliization, supine scapular series Person educated: Patient Education method: Explanation, Demonstration, Tactile cues, Verbal cues, and Handouts Education comprehension: returned demonstration   HOME EXERCISE PROGRAM:  Reviewed previously given post op HEP. Pt had not been doing clasped hands flexion or abd wall slides, but had been doing flexion wall slides. Educated on importance and gave another copy of post op exercises  ASSESSMENT:  CLINICAL IMPRESSION: Despite being hospitalized for 5 days pt has made excellent progress and has achieved 3 of 4 established goals. Quick dash score affected slightly by some left hand pain which lowered her score but was improved by 52% overall. She feels ready for release to HEP. She knows to continue with her ROM exs. during radiation, and to wear her compression bra as much as possible during radiation. She was educated in supine scapular series exercises and given a yellow band to complete them. She was advised to call with questions or concerns. She is feeling much better about things now that she has healed and is not as anxious. She is discharged from formal PT.  Pt will benefit from skilled therapeutic intervention to improve on the following deficits: Decreased knowledge of precautions, impaired UE functional use, pain, decreased ROM,  postural dysfunction.   PT treatment/interventions: ADL/Self care home management, Therapeutic exercises, Therapeutic activity, Patient/Family education,  Joint mobilization, Orthotic/Fit training, Aquatic Therapy, Manual lymph drainage, scar mobilization, and Manual therapy     GOALS: Goals reviewed with patient? Yes  LONG TERM GOALS:  (STG=LTG)  GOALS Name Target Date  Goal status  1 Pt will demonstrate she has regained full shoulder ROM and function post operatively compared to baselines.  Baseline: 11/03/2021 MET MET 11/03/2021  2 Left axillary cording will be resolved 8/3/12023 MET MET 11/03/2021  3 Pt will improve quick dash to no greater than 20% 11/03/2021 NOT MET 30% NOT MET  4 Pt will have arm/shoulder pain no greater than 3/10 11/03/2021 MET MET 11/03/2021  5 Pt will report a 50% improvement in pain in L breast to allow improved comfort. 11/03/2021 MET MET 11/03/2021     PLAN: PT FREQUENCY/DURATION: 2x/week for 6 weeks  PLAN FOR NEXT SESSION: STM to left upper quarter, MFR to upper arm cord, review HEP, AA/PROM, pics of LTR.  Continue MLD to L breast and instruct pt,Progress to strength as tolerated.  PHYSICAL THERAPY DISCHARGE SUMMARY  Visits from Start of Care: 7  Current functional level related to goals / functional outcomes: Achieved 4/5 goals established   Remaining deficits: Intermittent left breast pain   Education / Equipment: Compression bra, theraband   Patient agrees to discharge. Patient goals were partially met. Patient is being discharged due to being pleased with the current functional level.  Claris Pong, PT 11/03/2021, 4:56 PM

## 2021-11-03 NOTE — Patient Instructions (Signed)

## 2021-11-04 ENCOUNTER — Other Ambulatory Visit (HOSPITAL_COMMUNITY): Payer: Self-pay

## 2021-11-08 ENCOUNTER — Encounter: Payer: Self-pay | Admitting: Hematology and Oncology

## 2021-11-08 ENCOUNTER — Other Ambulatory Visit (HOSPITAL_COMMUNITY): Payer: Self-pay

## 2021-11-08 MED ORDER — BUPROPION HCL ER (XL) 300 MG PO TB24
ORAL_TABLET | ORAL | 0 refills | Status: AC
  Filled 2021-11-08: qty 30, 30d supply, fill #0

## 2021-11-09 ENCOUNTER — Other Ambulatory Visit (HOSPITAL_COMMUNITY): Payer: Self-pay

## 2021-11-10 ENCOUNTER — Encounter: Payer: Self-pay | Admitting: Hematology and Oncology

## 2021-11-10 ENCOUNTER — Other Ambulatory Visit (HOSPITAL_BASED_OUTPATIENT_CLINIC_OR_DEPARTMENT_OTHER): Payer: Self-pay

## 2021-11-10 ENCOUNTER — Encounter: Payer: Self-pay | Admitting: Adult Health

## 2021-11-10 ENCOUNTER — Encounter (HOSPITAL_BASED_OUTPATIENT_CLINIC_OR_DEPARTMENT_OTHER): Payer: Self-pay | Admitting: Pharmacist

## 2021-11-10 ENCOUNTER — Inpatient Hospital Stay (HOSPITAL_BASED_OUTPATIENT_CLINIC_OR_DEPARTMENT_OTHER): Payer: 59 | Admitting: Adult Health

## 2021-11-10 ENCOUNTER — Encounter: Payer: Self-pay | Admitting: *Deleted

## 2021-11-10 ENCOUNTER — Other Ambulatory Visit (HOSPITAL_COMMUNITY): Payer: Self-pay

## 2021-11-10 ENCOUNTER — Inpatient Hospital Stay: Payer: 59

## 2021-11-10 ENCOUNTER — Other Ambulatory Visit: Payer: Self-pay

## 2021-11-10 ENCOUNTER — Inpatient Hospital Stay: Payer: 59 | Attending: Hematology and Oncology

## 2021-11-10 VITALS — BP 139/72 | HR 85 | Temp 97.2°F | Resp 14 | Ht 69.0 in | Wt 207.6 lb

## 2021-11-10 DIAGNOSIS — Z79899 Other long term (current) drug therapy: Secondary | ICD-10-CM | POA: Diagnosis not present

## 2021-11-10 DIAGNOSIS — F32A Depression, unspecified: Secondary | ICD-10-CM | POA: Insufficient documentation

## 2021-11-10 DIAGNOSIS — Z5189 Encounter for other specified aftercare: Secondary | ICD-10-CM | POA: Insufficient documentation

## 2021-11-10 DIAGNOSIS — C50412 Malignant neoplasm of upper-outer quadrant of left female breast: Secondary | ICD-10-CM | POA: Diagnosis present

## 2021-11-10 DIAGNOSIS — Z170 Estrogen receptor positive status [ER+]: Secondary | ICD-10-CM

## 2021-11-10 DIAGNOSIS — Z803 Family history of malignant neoplasm of breast: Secondary | ICD-10-CM | POA: Insufficient documentation

## 2021-11-10 DIAGNOSIS — Z5111 Encounter for antineoplastic chemotherapy: Secondary | ICD-10-CM | POA: Insufficient documentation

## 2021-11-10 DIAGNOSIS — Z95828 Presence of other vascular implants and grafts: Secondary | ICD-10-CM

## 2021-11-10 DIAGNOSIS — Z800 Family history of malignant neoplasm of digestive organs: Secondary | ICD-10-CM | POA: Diagnosis not present

## 2021-11-10 LAB — CMP (CANCER CENTER ONLY)
ALT: 35 U/L (ref 0–44)
AST: 21 U/L (ref 15–41)
Albumin: 3.6 g/dL (ref 3.5–5.0)
Alkaline Phosphatase: 41 U/L (ref 38–126)
Anion gap: 2 — ABNORMAL LOW (ref 5–15)
BUN: 7 mg/dL (ref 6–20)
CO2: 27 mmol/L (ref 22–32)
Calcium: 8.6 mg/dL — ABNORMAL LOW (ref 8.9–10.3)
Chloride: 109 mmol/L (ref 98–111)
Creatinine: 0.58 mg/dL (ref 0.44–1.00)
GFR, Estimated: >60 mL/min (ref >60)
Glucose, Bld: 88 mg/dL (ref 70–99)
Potassium: 3.5 mmol/L (ref 3.5–5.1)
Sodium: 138 mmol/L (ref 135–145)
Total Bilirubin: 0.8 mg/dL (ref 0.3–1.2)
Total Protein: 5.8 g/dL — ABNORMAL LOW (ref 6.5–8.1)

## 2021-11-10 LAB — CBC WITH DIFFERENTIAL (CANCER CENTER ONLY)
Abs Immature Granulocytes: 0 10*3/uL (ref 0.00–0.07)
Basophils Absolute: 0 10*3/uL (ref 0.0–0.1)
Basophils Relative: 1 %
Eosinophils Absolute: 0 10*3/uL (ref 0.0–0.5)
Eosinophils Relative: 1 %
HCT: 30.1 % — ABNORMAL LOW (ref 36.0–46.0)
Hemoglobin: 10.4 g/dL — ABNORMAL LOW (ref 12.0–15.0)
Immature Granulocytes: 0 %
Lymphocytes Relative: 13 %
Lymphs Abs: 0.7 10*3/uL (ref 0.7–4.0)
MCH: 31.7 pg (ref 26.0–34.0)
MCHC: 34.6 g/dL (ref 30.0–36.0)
MCV: 91.8 fL (ref 80.0–100.0)
Monocytes Absolute: 0.4 10*3/uL (ref 0.1–1.0)
Monocytes Relative: 9 %
Neutro Abs: 4 10*3/uL (ref 1.7–7.7)
Neutrophils Relative %: 76 %
Platelet Count: 232 10*3/uL (ref 150–400)
RBC: 3.28 MIL/uL — ABNORMAL LOW (ref 3.87–5.11)
RDW: 14.9 % (ref 11.5–15.5)
WBC Count: 5.2 10*3/uL (ref 4.0–10.5)
nRBC: 0 % (ref 0.0–0.2)

## 2021-11-10 MED ORDER — SODIUM CHLORIDE 0.9 % IV SOLN: Freq: Once | INTRAVENOUS | Status: AC

## 2021-11-10 MED ORDER — SODIUM CHLORIDE 0.9 % IV SOLN
600.0000 mg/m2 | Freq: Once | INTRAVENOUS | Status: AC
  Administered 2021-11-10: 1260 mg via INTRAVENOUS
  Filled 2021-11-10: qty 50

## 2021-11-10 MED ORDER — DEXAMETHASONE 4 MG PO TABS
4.0000 mg | ORAL_TABLET | Freq: Two times a day (BID) | ORAL | 1 refills | Status: AC
  Filled 2021-11-10 – 2021-12-06 (×2): qty 30, 15d supply, fill #0

## 2021-11-10 MED ORDER — KETOROLAC TROMETHAMINE 30 MG/ML IJ SOLN
30.0000 mg | Freq: Once | INTRAMUSCULAR | Status: AC
  Administered 2021-11-10: 30 mg via INTRAVENOUS
  Filled 2021-11-10: qty 1

## 2021-11-10 MED ORDER — SODIUM CHLORIDE 0.9 % IV SOLN
75.0000 mg/m2 | Freq: Once | INTRAVENOUS | Status: AC
  Administered 2021-11-10: 160 mg via INTRAVENOUS
  Filled 2021-11-10: qty 16

## 2021-11-10 MED ORDER — KETOROLAC TROMETHAMINE 30 MG/ML IJ SOLN: 30.0000 mg | Freq: Once | INTRAMUSCULAR | Status: DC

## 2021-11-10 MED ORDER — SODIUM CHLORIDE 0.9 % IV SOLN
10.0000 mg | Freq: Once | INTRAVENOUS | Status: AC
  Administered 2021-11-10: 10 mg via INTRAVENOUS
  Filled 2021-11-10: qty 10

## 2021-11-10 MED ORDER — PROCHLORPERAZINE EDISYLATE 10 MG/2ML IJ SOLN
10.0000 mg | Freq: Once | INTRAMUSCULAR | Status: AC
  Administered 2021-11-10: 10 mg via INTRAVENOUS
  Filled 2021-11-10: qty 2

## 2021-11-10 MED ORDER — PEGFILGRASTIM 6 MG/0.6ML ~~LOC~~ PSKT
6.0000 mg | PREFILLED_SYRINGE | Freq: Once | SUBCUTANEOUS | Status: AC
  Administered 2021-11-10: 6 mg via SUBCUTANEOUS
  Filled 2021-11-10: qty 0.6

## 2021-11-10 MED ORDER — SODIUM CHLORIDE 0.9% FLUSH: 10.0000 mL | Freq: Once | INTRAVENOUS | Status: DC

## 2021-11-10 MED ORDER — SODIUM CHLORIDE 0.9% FLUSH
10.0000 mL | INTRAVENOUS | Status: DC | PRN
  Administered 2021-11-10: 10 mL

## 2021-11-10 MED ORDER — HEPARIN SOD (PORK) LOCK FLUSH 100 UNIT/ML IV SOLN
500.0000 [IU] | Freq: Once | INTRAVENOUS | Status: AC | PRN
  Administered 2021-11-10: 500 [IU]

## 2021-11-10 NOTE — Progress Notes (Signed)
[facility] Follow up:    Darlene Peters, [address] [place] [place] [address]   DIAGNOSIS:  Cancer Staging  Malignant neoplasm of upper-outer quadrant of left breast in female, estrogen receptor positive ([place]) Staging form: Breast, AJCC 8th Edition - Clinical stage from 06/22/2021: Stage IIA (cT2, cN0, cM0, G2, ER+, PR-, HER2-) - Unsigned Stage prefix: Initial diagnosis Histologic grading system: 3 grade system   SUMMARY OF ONCOLOGIC HISTORY: Oncology History  Malignant neoplasm of upper-outer quadrant of left breast in female, estrogen receptor positive ([place])  06/14/2021 Initial Diagnosis   Malignant neoplasm of upper-outer quadrant of left breast in female, estrogen receptor positive ([place])   07/26/2021 Genetic Testing   Negative hereditary cancer genetic testing: no pathogenic variants detected in [facility] BRCAPlus Panel and CancerNext +RNAinsight Panel.  Report dates are Jul 26, 2021 and August 08, 2021.   The BRCAplus panel offered by [employer] and includes sequencing and deletion/duplication analysis for the following 8 genes: ATM, BRCA1, BRCA2, CDH1, CHEK2, PALB2, PTEN, and TP53.  The CancerNext gene panel offered by [employer] includes sequencing, rearrangement analysis, and RNA analysis for the following 36 genes:   APC, ATM, AXIN2, BARD1, BMPR1A, BRCA1, BRCA2, BRIP1, CDH1, CDK4, CDKN2A, CHEK2, DICER1, HOXB13, EPCAM, GREM1, MLH1, MSH2, MSH3, MSH6, MUTYH, NBN, NF1, NTHL1, PALB2, PMS2, POLD1, POLE, PTEN, RAD51C, RAD51D, RECQL, SMAD4, SMARCA4, STK11, and TP53.    08/03/2021 Definitive Surgery   She had left breast lumpectomy with invasive ductal carcinoma, 2.7 cm, grade 2, resection margins negative, all sentinel lymph nodes negative, prior prognostics ER 15% positive moderate staining, PR 0% negative, HER2 negative, Ki-67 of 5%. Oncotype score of 28, distant recurrence risk at 9 years of 17%, absolute benefit of chemotherapy greater than 15%  on Oncotype she tested positive for ER and PR and HER2 negative   09/28/2021 - 10/20/2021 Chemotherapy   Patient is on Treatment Plan : BREAST TC q21d     09/28/2021 -  Chemotherapy   Patient is on Treatment Plan : BREAST TC q21d       CURRENT THERAPY: taxotere, cytoxan  INTERVAL HISTORY: Darlene Peters 51 y.o. female returns for follow-up prior to receiving cycle 3 of Taxotere and Cytoxan.  She has felt like the chemotherapy has been worse than she had anticipated.  She gets intermittent abdominal cramping and some sharp pains in her right breast around 2-3 o'clock that are similar to the pain she experienced in her breast when she was diagnosed with her cancer.  She notes some very mild intermittent peripheral neuropathy in the pads of her fingertips and toes that is more temperature related.  She also has experienced a headache that began when she received her chemotherapy in has not improved since that time.  She has no vision changes, focal weakness, or neurologic changes.  Otherwise she is doing well and is ready to proceed with her treatment today.   Patient Active Problem List   Diagnosis Date Noted   Port-A-Cath in place 10/20/2021   Bacteremia 10/03/2021   Syncope 10/03/2021   Constipation 10/03/2021   Genetic testing 07/27/2021   Family history of breast cancer 07/19/2021   Family history of prostate cancer 07/19/2021   Anxiety 06/22/2021   Difficulty sleeping 06/22/2021   Major depressive disorder, single episode, unspecified 06/22/2021   Malignant neoplasm of upper-outer quadrant of left breast in female, estrogen receptor positive ([place]) 06/14/2021   Other fatigue 11/05/2019   Psoriasis 10/14/2019   Gastroesophageal  reflux disease without esophagitis 05/01/2019   Intermittent asthma 07/05/2018   Low back pain 01/11/2017    is allergic to shellfish allergy, fish allergy, gluten meal, latex, other, and docetaxel.  MEDICAL HISTORY: Past Medical History:  Diagnosis  Date   Asthma    "worse when lying down"   Breast cancer ([place])    Family history of breast cancer 07/19/2021   Family history of prostate cancer 07/19/2021   GERD (gastroesophageal reflux disease)    Psoriasis    Urticaria     SURGICAL HISTORY: Past Surgical History:  Procedure Laterality Date   ABDOMINAL HYSTERECTOMY     partial   BREAST BIOPSY Left 05/26/2021   BREAST LUMPECTOMY WITH RADIOACTIVE SEED AND SENTINEL LYMPH NODE BIOPSY Left 08/03/2021   Procedure: LEFT BREAST LUMPECTOMY WITH RADIOACTIVE SEED AND SENTINEL LYMPH NODE BIOPSY;  Surgeon: Jovita Kussmaul, MD;  Location: [facility];  Service: General;  Laterality: Left;   IR IMAGING GUIDED PORT INSERTION  09/26/2021   RADIOACTIVE SEED GUIDED AXILLARY SENTINEL LYMPH NODE Left 08/03/2021   Procedure: RADIOACTIVE SEED GUIDED LEFT AXILLARY SENTINEL LYMPH NODE DISSECTION;  Surgeon: Jovita Kussmaul, MD;  Location: [facility];  Service: General;  Laterality: Left;   TUMOR REMOVAL Left 08/2021   breast    SOCIAL HISTORY: Social History   Socioeconomic History   Marital status: Married    Spouse name: Not on file   Number of children: Not on file   Years of education: Not on file   Highest education level: Not on file  Occupational History   Not on file  Tobacco Use   Smoking status: Never    Passive exposure: Never   Smokeless tobacco: Never  Vaping Use   Vaping Use: Never used  Substance and Sexual Activity   Alcohol use: No   Drug use: No   Sexual activity: Yes    Birth control/protection: Surgical  Other Topics Concern   Not on file  Social History Narrative   Not on file   Social Determinants of Health   Financial Resource Strain: High Risk (07/14/2021)   Overall Financial Resource Strain (CARDIA)    Difficulty of Paying Living Expenses: Hard  Food Insecurity: Not on file  Transportation Needs: Unmet Transportation Needs (10/19/2021)   PRAPARE - Transportation    Lack of  Transportation (Medical): Yes    Lack of Transportation (Non-Medical): No  Physical Activity: Not on file  Stress: Not on file  Social Connections: Not on file  Intimate Partner Violence: Not on file    FAMILY HISTORY: Family History  Problem Relation Age of Onset   Allergic rhinitis Mother    Allergic rhinitis Sister    Allergic rhinitis Sister    Breast cancer Maternal Aunt 45   Prostate cancer Maternal Uncle        mets; dx after 50   Prostate cancer Paternal Uncle    Leukemia Paternal Uncle    Prostate cancer Paternal Uncle 55   Gastric cancer Paternal Uncle    Skin cancer Maternal Grandmother 75   Breast cancer Cousin 31       maternal cousin   Cervical cancer Cousin    Breast cancer Cousin        dx 40s; maternal female cousin   Breast cancer Cousin        paternal female cousin x2    Review of Systems  Constitutional:  Positive for fatigue. Negative for appetite change, chills, fever and unexpected  weight change.  HENT:   Negative for hearing loss, lump/mass, mouth sores and trouble swallowing.   Eyes:  Negative for eye problems and icterus.  Respiratory:  Negative for chest tightness, cough and shortness of breath.   Cardiovascular:  Negative for chest pain, leg swelling and palpitations.  Gastrointestinal:  Negative for abdominal distention, abdominal pain, constipation, diarrhea, nausea and vomiting.  Endocrine: Negative for hot flashes.  Genitourinary:  Negative for difficulty urinating.   Musculoskeletal:  Negative for arthralgias and gait problem.  Skin:  Negative for itching and rash.  Neurological:  Positive for headaches and numbness. Negative for dizziness, extremity weakness, gait problem, light-headedness, seizures and speech difficulty.  Hematological:  Negative for adenopathy. Does not bruise/bleed easily.  Psychiatric/Behavioral:  Negative for confusion, decreased concentration and depression. The patient is not nervous/anxious.       PHYSICAL  EXAMINATION  ECOG PERFORMANCE STATUS: 1 - Symptomatic but completely ambulatory  Vitals:   11/10/21 1053  BP: 139/72  Pulse: 85  Resp: 14  Temp: (!) 97.2 F (36.2 C)  SpO2: 100%    Physical Exam Constitutional:      General: She is not in acute distress.    Appearance: Normal appearance. She is not toxic-appearing.  HENT:     Head: Normocephalic and atraumatic.  Eyes:     General: No scleral icterus. Cardiovascular:     Rate and Rhythm: Normal rate and regular rhythm.     Pulses: Normal pulses.     Heart sounds: Normal heart sounds.  Pulmonary:     Effort: Pulmonary effort is normal.     Breath sounds: Normal breath sounds.  Chest:     Comments: M right breast without nodules, masses, skin or nipple changes Abdominal:     General: Abdomen is flat. Bowel sounds are normal. There is no distension.     Palpations: Abdomen is soft.     Tenderness: There is no abdominal tenderness.  Musculoskeletal:        General: No swelling.     Cervical back: Neck supple.  Lymphadenopathy:     Cervical: No cervical adenopathy.  Skin:    General: Skin is warm and dry.     Findings: No rash.  Neurological:     General: No focal deficit present.     Mental Status: She is alert.  Psychiatric:        Mood and Affect: Mood normal.        Behavior: Behavior normal.     LABORATORY DATA:  CBC    Component Value Date/Time   WBC 5.2 11/10/2021 0956   WBC 8.1 10/05/2021 0529   RBC 3.28 (L) 11/10/2021 0956   HGB 10.4 (L) 11/10/2021 0956   HGB 14.0 08/09/2021 1505   HCT 30.1 (L) 11/10/2021 0956   HCT 40.9 08/09/2021 1505   PLT 232 11/10/2021 0956   MCV 91.8 11/10/2021 0956   MCV 91 08/09/2021 1505   MCH 31.7 11/10/2021 0956   MCHC 34.6 11/10/2021 0956   RDW 14.9 11/10/2021 0956   RDW 13.2 08/09/2021 1505   LYMPHSABS 0.7 11/10/2021 0956   LYMPHSABS 1.0 08/09/2021 1505   MONOABS 0.4 11/10/2021 0956   EOSABS 0.0 11/10/2021 0956   EOSABS 0.2 08/09/2021 1505   BASOSABS 0.0  11/10/2021 0956   BASOSABS 0.0 08/09/2021 1505    CMP     Component Value Date/Time   NA 138 11/10/2021 0956   NA 140 08/09/2021 1505   K 3.5 11/10/2021 0956  CL 109 11/10/2021 0956   CO2 27 11/10/2021 0956   GLUCOSE 88 11/10/2021 0956   BUN 7 11/10/2021 0956   BUN 11 08/09/2021 1505   CREATININE 0.58 11/10/2021 0956   CALCIUM 8.6 (L) 11/10/2021 0956   PROT 5.8 (L) 11/10/2021 0956   PROT 6.5 08/09/2021 1505   ALBUMIN 3.6 11/10/2021 0956   ALBUMIN 4.3 08/09/2021 1505   AST 21 11/10/2021 0956   ALT 35 11/10/2021 0956   ALKPHOS 41 11/10/2021 0956   BILITOT 0.8 11/10/2021 0956   GFRNONAA >60 11/10/2021 0956   GFRAA >60 04/28/2018 0819       ASSESSMENT and THERAPY PLAN:   Malignant neoplasm of upper-outer quadrant of left breast in female, estrogen receptor positive ([place]) Darlene Peters is here today for f/u today prior to receiving her third cycle of Taxotere and Cytoxan.  Her labs are stable and will proceed with treatment today.  She has mild peripheral neuropathy that is grade 1.  She does not require dose modifcation at this point.    We will give her some toradol for her headache.  I believe it is related to the Aloxi given with her chemotherapy, so we will refrain from using this pre chemo today.    Her abdominal discomfort could certainly be related to the dexamethasone, so after cycle 3 she will take 1 tab BID instead of 2 tab BID to see how she does.    I also ordered a diagnostic mammogram and ultrasound of her right breast.    We will see Darlene Peters back in 3 weeks for labs, f/u, and her final chemotherapy.     All questions were answered. The patient knows to call the clinic with any problems, questions or concerns. We can certainly see the patient much sooner if necessary  Total encounter time:30 minutes*in face-to-face visit time, chart review, lab review, care coordination, order entry, and documentation of the encounter time.  Wilber Bihari, NP 11/10/21 9:47  PM Medical Oncology and Hematology [facility] [place], [place] [address] Tel. [phone]    Fax. [phone]  *Total Encounter Time as defined by the Centers for Medicare and Medicaid Services includes, in addition to the face-to-face time of a patient visit (documented in the note above) non-face-to-face time: obtaining and reviewing outside history, ordering and reviewing medications, tests or procedures, care coordination (communications with other health care professionals or caregivers) and documentation in the medical record.

## 2021-11-10 NOTE — Assessment & Plan Note (Signed)
Darlene Peters is here today for f/u today prior to receiving her third cycle of Taxotere and Cytoxan.  Her labs are stable and will proceed with treatment today.  She has mild peripheral neuropathy that is grade 1.  She does not require dose modifcation at this point.    We will give her some toradol for her headache.  I believe it is related to the Aloxi given with her chemotherapy, so we will refrain from using this pre chemo today.    Her abdominal discomfort could certainly be related to the dexamethasone, so after cycle 3 she will take 1 tab BID instead of 2 tab BID to see how she does.    I also ordered a diagnostic mammogram and ultrasound of her right breast.    We will see Darlene Peters back in 3 weeks for labs, f/u, and her final chemotherapy.

## 2021-11-10 NOTE — Patient Instructions (Signed)
[facility]  Discharge Instructions: Thank you for choosing [facility] to provide your oncology and hematology care.   If you have a lab appointment with the [facility], please go directly to the [facility] and check in at the registration area.   Wear comfortable clothing and clothing appropriate for easy access to any Portacath or PICC line.   We strive to give you quality time with your provider. You may need to reschedule your appointment if you arrive late (15 or more minutes).  Arriving late affects you and other patients whose appointments are after yours.  Also, if you miss three or more appointments without notifying the office, you may be dismissed from the clinic at the provider's discretion.      For prescription refill requests, have your pharmacy contact our office and allow 72 hours for refills to be completed.    Today you received the following chemotherapy and/or immunotherapy agents: Docetaxel and Cytoxan      To help prevent nausea and vomiting after your treatment, we encourage you to take your nausea medication as directed.  BELOW ARE SYMPTOMS THAT SHOULD BE REPORTED IMMEDIATELY: *FEVER GREATER THAN 100.4 F (38 C) OR HIGHER *CHILLS OR SWEATING *NAUSEA AND VOMITING THAT IS NOT CONTROLLED WITH YOUR NAUSEA MEDICATION *UNUSUAL SHORTNESS OF BREATH *UNUSUAL BRUISING OR BLEEDING *URINARY PROBLEMS (pain or burning when urinating, or frequent urination) *BOWEL PROBLEMS (unusual diarrhea, constipation, pain near the anus) TENDERNESS IN MOUTH AND THROAT WITH OR WITHOUT PRESENCE OF ULCERS (sore throat, sores in mouth, or a toothache) UNUSUAL RASH, SWELLING OR PAIN  UNUSUAL VAGINAL DISCHARGE OR ITCHING   Items with * indicate a potential emergency and should be followed up as soon as possible or go to the Emergency Department if any problems should occur.  Please show the CHEMOTHERAPY ALERT CARD or IMMUNOTHERAPY ALERT CARD at  check-in to the Emergency Department and triage nurse.  Should you have questions after your visit or need to cancel or reschedule your appointment, please contact [facility]  Dept: [phone]  and follow the prompts.  Office hours are 8:00 a.m. to 4:30 p.m. Monday - Friday. Please note that voicemails left after 4:00 p.m. may not be returned until the following business day.  We are closed weekends and major holidays. You have access to a nurse at all times for urgent questions. Please call the main number to the clinic Dept: [phone] and follow the prompts.   For any non-urgent questions, you may also contact your provider using MyChart. We now offer e-Visits for anyone 77 and older to request care online for non-urgent symptoms. For details visit [email or web address].   Also download the MyChart app! Go to the app store, search "MyChart", open the app, select [facility], and log in with your MyChart username and password.  Masks are optional in the cancer centers. If you would like for your care team to wear a mask while they are taking care of you, please let them know. You may have one support Iley Breeden who is at least 52 years old accompany you for your appointments.

## 2021-11-11 ENCOUNTER — Telehealth: Payer: Self-pay

## 2021-11-11 NOTE — Telephone Encounter (Signed)
Return call to pt who states she is still having h/a.  Pt has not taken any meds as she prefers to speak with [place] for recommendations.  No n/v and patient states she has been maintaining hydration.  Pt has not taken decadron but has taken compazine despite no n/v.  I advise pt that this could be the cause of her headache.  Per NP we recommend stopping compazine unless n/v begins, start decadron as prescribed, excedrin migraine if needed and powerade/gatorade.  I also advised pt that if she would like to have IV fluids we can start incorporating this into her tx plan.  Pt verbalized understanding and thanks

## 2021-11-12 ENCOUNTER — Other Ambulatory Visit: Payer: Self-pay

## 2021-11-12 ENCOUNTER — Inpatient Hospital Stay: Payer: 59

## 2021-11-14 ENCOUNTER — Other Ambulatory Visit (HOSPITAL_COMMUNITY): Payer: Self-pay

## 2021-11-17 ENCOUNTER — Encounter: Payer: Self-pay | Admitting: Hematology and Oncology

## 2021-11-23 ENCOUNTER — Other Ambulatory Visit: Payer: Self-pay

## 2021-11-28 ENCOUNTER — Ambulatory Visit
Admission: RE | Admit: 2021-11-28 | Discharge: 2021-11-28 | Disposition: A | Discharge status: Home / Self Care | Payer: 59 | Source: Ambulatory Visit | Attending: Adult Health | Admitting: Adult Health

## 2021-11-28 ENCOUNTER — Other Ambulatory Visit (HOSPITAL_COMMUNITY): Payer: Self-pay

## 2021-11-28 ENCOUNTER — Other Ambulatory Visit: Payer: Self-pay | Admitting: Adult Health

## 2021-11-28 DIAGNOSIS — C50412 Malignant neoplasm of upper-outer quadrant of left female breast: Secondary | ICD-10-CM

## 2021-11-28 DIAGNOSIS — N644 Mastodynia: Secondary | ICD-10-CM

## 2021-11-28 MED ORDER — BUPROPION HCL 75 MG PO TABS
75.0000 mg | ORAL_TABLET | Freq: Two times a day (BID) | ORAL | 2 refills | Status: AC
  Filled 2021-11-28: qty 60, 30d supply, fill #0
  Filled 2022-01-04: qty 60, 30d supply, fill #1
  Filled 2022-02-26: qty 60, 30d supply, fill #2

## 2021-11-29 ENCOUNTER — Other Ambulatory Visit (HOSPITAL_COMMUNITY): Payer: Self-pay

## 2021-11-30 ENCOUNTER — Encounter (INDEPENDENT_AMBULATORY_CARE_PROVIDER_SITE_OTHER): Payer: Self-pay

## 2021-12-01 ENCOUNTER — Other Ambulatory Visit: Payer: Self-pay

## 2021-12-01 ENCOUNTER — Inpatient Hospital Stay: Payer: 59

## 2021-12-01 ENCOUNTER — Other Ambulatory Visit: Payer: Self-pay | Admitting: Hematology and Oncology

## 2021-12-01 ENCOUNTER — Inpatient Hospital Stay (HOSPITAL_BASED_OUTPATIENT_CLINIC_OR_DEPARTMENT_OTHER): Payer: 59 | Admitting: Hematology and Oncology

## 2021-12-01 ENCOUNTER — Encounter: Payer: Self-pay | Admitting: Hematology and Oncology

## 2021-12-01 VITALS — BP 118/77 | HR 89 | Resp 16

## 2021-12-01 VITALS — BP 133/81 | HR 81 | Temp 97.0°F | Resp 14 | Ht 69.0 in | Wt 205.5 lb

## 2021-12-01 DIAGNOSIS — R5383 Other fatigue: Secondary | ICD-10-CM

## 2021-12-01 DIAGNOSIS — Z170 Estrogen receptor positive status [ER+]: Secondary | ICD-10-CM

## 2021-12-01 DIAGNOSIS — C50412 Malignant neoplasm of upper-outer quadrant of left female breast: Secondary | ICD-10-CM

## 2021-12-01 DIAGNOSIS — G629 Polyneuropathy, unspecified: Secondary | ICD-10-CM

## 2021-12-01 DIAGNOSIS — T451X5A Adverse effect of antineoplastic and immunosuppressive drugs, initial encounter: Secondary | ICD-10-CM

## 2021-12-01 DIAGNOSIS — Z5111 Encounter for antineoplastic chemotherapy: Secondary | ICD-10-CM | POA: Diagnosis not present

## 2021-12-01 DIAGNOSIS — Z95828 Presence of other vascular implants and grafts: Secondary | ICD-10-CM

## 2021-12-01 LAB — CMP (CANCER CENTER ONLY)
ALT: 33 U/L (ref 0–44)
AST: 22 U/L (ref 15–41)
Albumin: 3.6 g/dL (ref 3.5–5.0)
Alkaline Phosphatase: 42 U/L (ref 38–126)
Anion gap: 6 (ref 5–15)
BUN: 7 mg/dL (ref 6–20)
CO2: 27 mmol/L (ref 22–32)
Calcium: 8.6 mg/dL — ABNORMAL LOW (ref 8.9–10.3)
Chloride: 108 mmol/L (ref 98–111)
Creatinine: 0.51 mg/dL (ref 0.44–1.00)
GFR, Estimated: >60 mL/min (ref >60)
Glucose, Bld: 95 mg/dL (ref 70–99)
Potassium: 3.5 mmol/L (ref 3.5–5.1)
Sodium: 141 mmol/L (ref 135–145)
Total Bilirubin: 0.9 mg/dL (ref 0.3–1.2)
Total Protein: 5.8 g/dL — ABNORMAL LOW (ref 6.5–8.1)

## 2021-12-01 LAB — CBC WITH DIFFERENTIAL (CANCER CENTER ONLY)
Abs Immature Granulocytes: 0.01 10*3/uL (ref 0.00–0.07)
Basophils Absolute: 0 10*3/uL (ref 0.0–0.1)
Basophils Relative: 1 %
Eosinophils Absolute: 0 10*3/uL (ref 0.0–0.5)
Eosinophils Relative: 1 %
HCT: 31.1 % — ABNORMAL LOW (ref 36.0–46.0)
Hemoglobin: 10.5 g/dL — ABNORMAL LOW (ref 12.0–15.0)
Immature Granulocytes: 0 %
Lymphocytes Relative: 16 %
Lymphs Abs: 0.8 10*3/uL (ref 0.7–4.0)
MCH: 32.1 pg (ref 26.0–34.0)
MCHC: 33.8 g/dL (ref 30.0–36.0)
MCV: 95.1 fL (ref 80.0–100.0)
Monocytes Absolute: 0.5 10*3/uL (ref 0.1–1.0)
Monocytes Relative: 10 %
Neutro Abs: 3.6 10*3/uL (ref 1.7–7.7)
Neutrophils Relative %: 72 %
Platelet Count: 212 10*3/uL (ref 150–400)
RBC: 3.27 MIL/uL — ABNORMAL LOW (ref 3.87–5.11)
RDW: 16 % — ABNORMAL HIGH (ref 11.5–15.5)
WBC Count: 5 10*3/uL (ref 4.0–10.5)
nRBC: 0 % (ref 0.0–0.2)

## 2021-12-01 MED ORDER — SODIUM CHLORIDE 0.9 % IV SOLN: Freq: Once | INTRAVENOUS | Status: AC

## 2021-12-01 MED ORDER — SODIUM CHLORIDE 0.9 % IV SOLN
600.0000 mg/m2 | Freq: Once | INTRAVENOUS | Status: AC
  Administered 2021-12-01: 1260 mg via INTRAVENOUS
  Filled 2021-12-01: qty 63

## 2021-12-01 MED ORDER — PEGFILGRASTIM 6 MG/0.6ML ~~LOC~~ PSKT
6.0000 mg | PREFILLED_SYRINGE | Freq: Once | SUBCUTANEOUS | Status: AC
  Administered 2021-12-01: 6 mg via SUBCUTANEOUS
  Filled 2021-12-01: qty 0.6

## 2021-12-01 MED ORDER — SODIUM CHLORIDE 0.9% FLUSH
10.0000 mL | Freq: Once | INTRAVENOUS | Status: AC
  Administered 2021-12-01: 10 mL

## 2021-12-01 MED ORDER — SODIUM CHLORIDE 0.9 % IV SOLN
75.0000 mg/m2 | Freq: Once | INTRAVENOUS | Status: AC
  Administered 2021-12-01: 160 mg via INTRAVENOUS
  Filled 2021-12-01: qty 16

## 2021-12-01 MED ORDER — SODIUM CHLORIDE 0.9% FLUSH
10.0000 mL | INTRAVENOUS | Status: DC | PRN
  Administered 2021-12-01: 10 mL

## 2021-12-01 MED ORDER — SODIUM CHLORIDE 0.9 % IV SOLN
10.0000 mg | Freq: Once | INTRAVENOUS | Status: AC
  Administered 2021-12-01: 10 mg via INTRAVENOUS
  Filled 2021-12-01: qty 10

## 2021-12-01 MED ORDER — PROCHLORPERAZINE EDISYLATE 10 MG/2ML IJ SOLN
10.0000 mg | Freq: Once | INTRAMUSCULAR | Status: AC
  Administered 2021-12-01: 10 mg via INTRAVENOUS
  Filled 2021-12-01: qty 2

## 2021-12-01 MED ORDER — HEPARIN SOD (PORK) LOCK FLUSH 100 UNIT/ML IV SOLN
500.0000 [IU] | Freq: Once | INTRAVENOUS | Status: AC | PRN
  Administered 2021-12-01: 500 [IU]

## 2021-12-01 NOTE — Progress Notes (Signed)
Dr. Gilberto Better is [facility] CONSULT NOTE  Patient Care Team: Sueanne Margarita, DO as PCP - General (Internal Medicine) Rockwell Germany, RN as Oncology Nurse Navigator Mauro Kaufmann, RN as Oncology Nurse Navigator Benay Pike, MD as Consulting Physician (Hematology and Oncology) Jovita Kussmaul, MD as Consulting Physician (General Surgery) Eppie Gibson, MD as Attending Physician (Radiation Oncology)  CHIEF COMPLAINTS/PURPOSE OF CONSULTATION:  Newly diagnosed breast cancer  HISTORY OF PRESENTING ILLNESS:  Darlene Peters 51 y.o. female is here because of recent diagnosis of left breast cancer  I reviewed her records extensively and collaborated the history with the patient.  SUMMARY OF ONCOLOGIC HISTORY: Oncology History  Malignant neoplasm of upper-outer quadrant of left breast in female, estrogen receptor positive ([place] [facility])  06/14/2021 Initial Diagnosis   Malignant neoplasm of upper-outer quadrant of left breast in female, estrogen receptor positive ([facility])   07/26/2021 Genetic Testing   Negative hereditary cancer genetic testing: no pathogenic variants detected in [facility] BRCAPlus Panel and CancerNext +RNAinsight Panel.  Report dates are Jul 26, 2021 and August 08, 2021.   The BRCAplus panel offered by [employer] and includes sequencing and deletion/duplication analysis for the following 8 genes: ATM, BRCA1, BRCA2, CDH1, CHEK2, PALB2, PTEN, and TP53.  The CancerNext gene panel offered by [employer] includes sequencing, rearrangement analysis, and RNA analysis for the following 36 genes:   APC, ATM, AXIN2, BARD1, BMPR1A, BRCA1, BRCA2, BRIP1, CDH1, CDK4, CDKN2A, CHEK2, DICER1, HOXB13, EPCAM, GREM1, MLH1, MSH2, MSH3, MSH6, MUTYH, NBN, NF1, NTHL1, PALB2, PMS2, POLD1, POLE, PTEN, RAD51C, RAD51D, RECQL, SMAD4, SMARCA4, STK11, and TP53.    08/03/2021 Definitive Surgery   She had left breast lumpectomy with invasive ductal carcinoma, 2.7 cm, grade 2, resection margins negative, all  sentinel lymph nodes negative, prior prognostics ER 15% positive moderate staining, PR 0% negative, HER2 negative, Ki-67 of 5%. Oncotype score of 28, distant recurrence risk at 9 years of 17%, absolute benefit of chemotherapy greater than 15% on Oncotype she tested positive for ER and PR and HER2 negative   09/28/2021 - 10/20/2021 Chemotherapy   Patient is on Treatment Plan : BREAST TC q21d     09/28/2021 -  Chemotherapy   Patient is on Treatment Plan : BREAST TC q21d     She works at [employer] for [employer]. Husband is estranged. She has 4 kids, one daughter and three son. Daughters are 19, sons are 59, 70 and 73. She has 4 grandchildren. Oldest son lives here.    Interval History  She is here before anticipated fourth cycle of docetaxel and cyclophosphamide. Since last visit, she describes some jumping in her muscles especially her abdomen, her legs and her arms.  She went to see her PCP as well, was diagnosed with vitamin D deficiency and was started on vitamin D supplementation.  She describes some mild tingling or numbness in her hands or her foot which is not necessarily persistent, not bothersome, does not affect her activities of daily living.  She describes some sweats and chills but this is normal for her.  No fevers.  No change in breathing or bowel habits or urinary habits otherwise.    Rest of the pertinent 10 point ROS reviewed and negative.  MEDICAL HISTORY:  Past Medical History:  Diagnosis Date   Asthma    "worse when lying down"   Breast cancer [facility])    Family history of breast cancer 07/19/2021   Family history of prostate cancer 07/19/2021   GERD (gastroesophageal  reflux disease)    Psoriasis    Urticaria     SURGICAL HISTORY: Past Surgical History:  Procedure Laterality Date   ABDOMINAL HYSTERECTOMY     partial   BREAST BIOPSY Left 05/26/2021   BREAST LUMPECTOMY WITH RADIOACTIVE SEED AND SENTINEL LYMPH NODE BIOPSY Left 08/03/2021   Procedure: LEFT BREAST  LUMPECTOMY WITH RADIOACTIVE SEED AND SENTINEL LYMPH NODE BIOPSY;  Surgeon: Jovita Kussmaul, MD;  Location: [facility];  Service: General;  Laterality: Left;   IR IMAGING GUIDED PORT INSERTION  09/26/2021   RADIOACTIVE SEED GUIDED AXILLARY SENTINEL LYMPH NODE Left 08/03/2021   Procedure: RADIOACTIVE SEED GUIDED LEFT AXILLARY SENTINEL LYMPH NODE DISSECTION;  Surgeon: Jovita Kussmaul, MD;  Location: [facility];  Service: General;  Laterality: Left;   TUMOR REMOVAL Left 08/2021   breast    SOCIAL HISTORY: Social History   Socioeconomic History   Marital status: Married    Spouse name: Not on file   Number of children: Not on file   Years of education: Not on file   Highest education level: Not on file  Occupational History   Not on file  Tobacco Use   Smoking status: Never    Passive exposure: Never   Smokeless tobacco: Never  Vaping Use   Vaping Use: Never used  Substance and Sexual Activity   Alcohol use: No   Drug use: No   Sexual activity: Yes    Birth control/protection: Surgical  Other Topics Concern   Not on file  Social History Narrative   Not on file   Social Determinants of Health   Financial Resource Strain: High Risk (07/14/2021)   Overall Financial Resource Strain (CARDIA)    Difficulty of Paying Living Expenses: Hard  Food Insecurity: Not on file  Transportation Needs: Unmet Transportation Needs (10/19/2021)   PRAPARE - Transportation    Lack of Transportation (Medical): Yes    Lack of Transportation (Non-Medical): No  Physical Activity: Not on file  Stress: Not on file  Social Connections: Not on file  Intimate Partner Violence: Not on file    FAMILY HISTORY: Family History  Problem Relation Age of Onset   Allergic rhinitis Mother    Allergic rhinitis Sister    Allergic rhinitis Sister    Breast cancer Maternal Aunt 45   Prostate cancer Maternal Uncle        mets; dx after 50   Prostate cancer Paternal Uncle    Leukemia  Paternal Uncle    Prostate cancer Paternal Uncle 77   Gastric cancer Paternal Uncle    Skin cancer Maternal Grandmother 45   Breast cancer Cousin 43       maternal cousin   Cervical cancer Cousin    Breast cancer Cousin        dx 53s; maternal female cousin   Breast cancer Cousin        paternal female cousin x2    ALLERGIES:  is allergic to shellfish allergy, fish allergy, gluten meal, latex, other, and docetaxel.  MEDICATIONS:  Current Outpatient Medications  Medication Sig Dispense Refill   Acetaminophen (TYLENOL 8 HOUR PO) Take 1 tablet by mouth every 6 (six) hours as needed (pain).     albuterol (PROVENTIL HFA) 108 (90 Base) MCG/ACT inhaler Inhale 2 puffs into the lungs every 4 (four) hours as needed for wheezing or shortness of breath. 8.5 g 2   buPROPion (WELLBUTRIN XL) 300 MG 24 hr tablet Take 1 tablet  by mouth  daily. 30 tablet 0   buPROPion (WELLBUTRIN) 75 MG tablet Take 1 tablet (75 mg total) by mouth 2 (two) times daily. 60 tablet 2   clobetasol (TEMOVATE) 0.05 % external solution Apply topically to affected areas twice daily as needed (not to face,groin,or axilla) 50 mL 3   desonide (DESOWEN) 0.05 % lotion Apply topically to affected areas up to  twice daily as needed (not to face,groin,or axilla) 60 mL 3   dexamethasone (DECADRON) 4 MG tablet Take 1 tablet (4 mg total) by mouth 2 (two) times daily. Start the day before Taxotere. Then again the day after chemo for 3 days. 30 tablet 1   docusate sodium (COLACE) 100 MG capsule Take 1 capsule (100 mg total) by mouth every 12 (twelve) hours. 60 capsule 0   famotidine (PEPCID) 20 MG tablet Take 1 tablet (20 mg total) by mouth 2 (two) times daily. 60 tablet 3   gabapentin (NEURONTIN) 100 MG capsule Take 1 capsule (100 mg total) by mouth 3 (three) times daily. 90 capsule 2   levocetirizine (XYZAL) 5 MG tablet Take 1 tablet (5 mg total) by mouth every evening. 30 tablet 5   lidocaine-prilocaine (EMLA) cream Apply to affected area  once. 30 g 3   polyethylene glycol (MIRALAX / GLYCOLAX) 17 g packet Take 17 g by mouth daily. 14 each 0   prochlorperazine (COMPAZINE) 10 MG tablet Take 1 tablet (10 mg total) by mouth every 6 (six) hours as needed for nausea or vomiting. 30 tablet 1   triamcinolone cream (KENALOG) 0.1 % Apply topically to affected areas twice daily as needed (not to face,groin,or axilla) (Patient not taking: Reported on 10/02/2021) 453.6 g 3   No current facility-administered medications for this visit.    REVIEW OF SYSTEMS:   Constitutional: Denies fevers, chills or abnormal night sweats Eyes: Denies blurriness of vision, double vision or watery eyes Ears, nose, mouth, throat, and face: Denies mucositis or sore throat Respiratory: Denies cough, dyspnea or wheezes Cardiovascular: Denies palpitation, chest discomfort or lower extremity swelling Gastrointestinal:  Denies nausea, heartburn or change in bowel habits Skin: Denies abnormal skin rashes Lymphatics: Denies new lymphadenopathy or easy bruising Neurological:Denies numbness, tingling or new weaknesses Behavioral/Psych: Mood is stable, no new changes  Breast: Noticed the dent in the breast along with some nipple retraction in the left breast All other systems were reviewed with the patient and are negative.  PHYSICAL EXAMINATION: ECOG PERFORMANCE STATUS: 0 - Asymptomatic  Vitals:   12/01/21 0859  BP: 133/81  Pulse: 81  Resp: 14  Temp: (!) 97 F (36.1 C)  SpO2: 100%    Filed Weights   12/01/21 0859  Weight: 205 lb 8 oz (93.2 kg)     Physical Exam Constitutional:      General: She is not in acute distress. HENT:     Head: Normocephalic and atraumatic.  Cardiovascular:     Rate and Rhythm: Normal rate and regular rhythm.  Pulmonary:     Effort: Pulmonary effort is normal.     Breath sounds: Normal breath sounds.  Musculoskeletal:     Cervical back: Normal range of motion and neck supple. No rigidity.  Lymphadenopathy:      Cervical: No cervical adenopathy.  Skin:    General: Skin is warm and dry.  Neurological:     General: No focal deficit present.     Mental Status: She is alert.      LABORATORY DATA:  I have reviewed the data as listed  Lab Results  Component Value Date   WBC 5.0 12/01/2021   HGB 10.5 (L) 12/01/2021   HCT 31.1 (L) 12/01/2021   MCV 95.1 12/01/2021   PLT 212 12/01/2021   Lab Results  Component Value Date   NA 141 12/01/2021   K 3.5 12/01/2021   CL 108 12/01/2021   CO2 27 12/01/2021    RADIOGRAPHIC STUDIES: I have personally reviewed the radiological reports and agreed with the findings in the report.  ASSESSMENT AND PLAN:  Malignant neoplasm of upper-outer quadrant of left breast in female, estrogen receptor positive (Darlene Peters) This is a 51 year old female patient with newly diagnosed left breast invasive ductal carcinoma, grade 2, ER 15% moderate staining, PR negative, HER2 negative, KI of 2% referred to medical oncology for adjuvant recommendations.  She is now postsurgery and is here to discuss Oncotype results and role of chemotherapy.  We have discussed final pathology as well as Oncotype score which resulted at 28, distant recurrence risk at 9 years of 17%, absolute benefit of chemotherapy greater than 15%.  Given young age, excellent PS, Oncotype score 28, we have discussed about adjuvant chemotherapy with TC every 21 days for 4 cycles.  We have discussed about adverse effects of chemotherapy including but not limited to fatigue, nausea, alopecia vomiting, increased risk of infections, neuropathy etc.  She understands that some of the side effects can be permanent.   She is here for her planned fourth cycle of chemotherapy.  Since her last visit, she describes some fatigue as well as sensation of jumping in her muscles.  She has mild neuropathy in her feet but this is not persistent and does not interfere with her activities of daily living.  Physical examination today  unremarkable.  We have reviewed the labs, satisfactory to proceed with treatment.  At this time we have discussed about dose reduction but patient feels that her symptoms are mild enough that she does not want to proceed with dose reduction.  She will proceed with adjuvant radiation after chemotherapy and then return to clinic in about 8 weeks.   Total time spent: 30 minutes. All questions were answered. The patient knows to call the clinic with any problems, questions or concerns.    Benay Pike, MD 12/01/21

## 2021-12-01 NOTE — Assessment & Plan Note (Signed)
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  She is now postsurgery and is here to discuss Oncotype results and role of chemotherapy.  We have discussed final pathology as well as Oncotype score which resulted at 28, distant recurrence risk at 9 years of 17%, absolute benefit of chemotherapy greater than 15%.  Given young age, excellent PS, Oncotype score 28, we have discussed about adjuvant chemotherapy with TC every 21 days for 4 cycles.  We have discussed about adverse effects of chemotherapy including but not limited to fatigue, nausea, alopecia vomiting, increased risk of infections, neuropathy etc.  She understands that some of the side effects can be permanent.   She is here for her planned fourth cycle of chemotherapy.  Since her last visit, she describes some fatigue as well as sensation of jumping in her muscles.  She has mild neuropathy in her feet but this is not persistent and does not interfere with her activities of daily living.  Physical examination today unremarkable.  We have reviewed the labs, satisfactory to proceed with treatment.  At this time we have discussed about dose reduction but patient feels that her symptoms are mild enough that she does not want to proceed with dose reduction.  She will proceed with adjuvant radiation after chemotherapy and then return to clinic in about 8 weeks.

## 2021-12-01 NOTE — Patient Instructions (Addendum)
[facility]  Discharge Instructions: Thank you for choosing [facility] to provide your oncology and hematology care.   If you have a lab appointment with the [facility], please go directly to the [facility] and check in at the registration area.   Wear comfortable clothing and clothing appropriate for easy access to any Portacath or PICC line.   We strive to give you quality time with your provider. You may need to reschedule your appointment if you arrive late (15 or more minutes).  Arriving late affects you and other patients whose appointments are after yours.  Also, if you miss three or more appointments without notifying the office, you may be dismissed from the clinic at the provider's discretion.      For prescription refill requests, have your pharmacy contact our office and allow 72 hours for refills to be completed.    Today you received the following chemotherapy and/or immunotherapy agents: Docetaxel/Cytoxan      To help prevent nausea and vomiting after your treatment, we encourage you to take your nausea medication as directed.  BELOW ARE SYMPTOMS THAT SHOULD BE REPORTED IMMEDIATELY: *FEVER GREATER THAN 100.4 F (38 C) OR HIGHER *CHILLS OR SWEATING *NAUSEA AND VOMITING THAT IS NOT CONTROLLED WITH YOUR NAUSEA MEDICATION *UNUSUAL SHORTNESS OF BREATH *UNUSUAL BRUISING OR BLEEDING *URINARY PROBLEMS (pain or burning when urinating, or frequent urination) *BOWEL PROBLEMS (unusual diarrhea, constipation, pain near the anus) TENDERNESS IN MOUTH AND THROAT WITH OR WITHOUT PRESENCE OF ULCERS (sore throat, sores in mouth, or a toothache) UNUSUAL RASH, SWELLING OR PAIN  UNUSUAL VAGINAL DISCHARGE OR ITCHING   Items with * indicate a potential emergency and should be followed up as soon as possible or go to the Emergency Department if any problems should occur.  Please show the CHEMOTHERAPY ALERT CARD or IMMUNOTHERAPY ALERT CARD at  check-in to the Emergency Department and triage nurse.  Should you have questions after your visit or need to cancel or reschedule your appointment, please contact [facility]  Dept: [phone]  and follow the prompts.  Office hours are 8:00 a.m. to 4:30 p.m. Monday - Friday. Please note that voicemails left after 4:00 p.m. may not be returned until the following business day.  We are closed weekends and major holidays. You have access to a nurse at all times for urgent questions. Please call the main number to the clinic Dept: [phone] and follow the prompts.   For any non-urgent questions, you may also contact your provider using MyChart. We now offer e-Visits for anyone 43 and older to request care online for non-urgent symptoms. For details visit [email or web address].   Also download the MyChart app! Go to the app store, search "MyChart", open the app, select [facility], and log in with your MyChart username and password.  Masks are optional in the cancer centers. If you would like for your care team to wear a mask while they are taking care of you, please let them know. You may have one support person who is at least 52 years old accompany you for your appointments. Pegfilgrastim Injection What is this medication? PEGFILGRASTIM (PEG fil gra stim) lowers the risk of infection in people who are receiving chemotherapy. It works by Building control surveyor make more white blood cells, which protects your body from infection. It may also be used to help people who have been exposed to high doses of radiation. This medicine may be used for  other purposes; ask your health care provider or pharmacist if you have questions. COMMON BRAND NAME(S): [employer], Neulasta, Nyvepria, Stimufend, UDENYCA, Ziextenzo What should I tell my care team before I take this medication? They need to know if you have any of these conditions: Kidney disease Latex allergy Ongoing  radiation therapy Sickle cell disease Skin reactions to acrylic adhesives (On-Body Injector only) An unusual or allergic reaction to pegfilgrastim, filgrastim, other medications, foods, dyes, or preservatives Pregnant or trying to get pregnant Breast-feeding How should I use this medication? This medication is for injection under the skin. If you get this medication at home, you will be taught how to prepare and give the pre-filled syringe or how to use the On-body Injector. Refer to the patient Instructions for Use for detailed instructions. Use exactly as directed. Tell your care team immediately if you suspect that the On-body Injector may not have performed as intended or if you suspect the use of the On-body Injector resulted in a missed or partial dose. It is important that you put your used needles and syringes in a special sharps container. Do not put them in a trash can. If you do not have a sharps container, call your pharmacist or care team to get one. Talk to your care team about the use of this medication in children. While this medication may be prescribed for selected conditions, precautions do apply. Overdosage: If you think you have taken too much of this medicine contact a poison control center or emergency room at once. NOTE: This medicine is only for you. Do not share this medicine with others. What if I miss a dose? It is important not to miss your dose. Call your care team if you miss your dose. If you miss a dose due to an On-body Injector failure or leakage, a new dose should be administered as soon as possible using a single prefilled syringe for manual use. What may interact with this medication? Interactions have not been studied. This list may not describe all possible interactions. Give your health care provider a list of all the medicines, herbs, non-prescription drugs, or dietary supplements you use. Also tell them if you smoke, drink alcohol, or use illegal drugs. Some  items may interact with your medicine. What should I watch for while using this medication? Your condition will be monitored carefully while you are receiving this medication. You may need blood work done while you are taking this medication. Talk to your care team about your risk of cancer. You may be more at risk for certain types of cancer if you take this medication. If you are going to need a MRI, CT scan, or other procedure, tell your care team that you are using this medication (On-Body Injector only). What side effects may I notice from receiving this medication? Side effects that you should report to your care team as soon as possible: Allergic reactions--skin rash, itching, hives, swelling of the face, lips, tongue, or throat Capillary leak syndrome--stomach or muscle pain, unusual weakness or fatigue, feeling faint or lightheaded, decrease in the amount of urine, swelling of the ankles, hands, or feet, trouble breathing High white blood cell level--fever, fatigue, trouble breathing, night sweats, change in vision, weight loss Inflammation of the aorta--fever, fatigue, back, chest, or stomach pain, severe headache Kidney injury (glomerulonephritis)--decrease in the amount of urine, red or dark brown urine, foamy or bubbly urine, swelling of the ankles, hands, or feet Shortness of breath or trouble breathing Spleen injury--pain in  upper left stomach or shoulder Unusual bruising or bleeding Side effects that usually do not require medical attention (report to your care team if they continue or are bothersome): Bone pain Pain in the hands or feet This list may not describe all possible side effects. Call your doctor for medical advice about side effects. You may report side effects to FDA at 1-800-FDA-1088. Where should I keep my medication? Keep out of the reach of children. If you are using this medication at home, you will be instructed on how to store it. Throw away any unused  medication after the expiration date on the label. NOTE: This sheet is a summary. It may not cover all possible information. If you have questions about this medicine, talk to your doctor, pharmacist, or health care provider.  2023 Elsevier/Gold Standard (2013-05-23 00:00:00)

## 2021-12-02 ENCOUNTER — Encounter: Payer: Self-pay | Admitting: *Deleted

## 2021-12-03 ENCOUNTER — Inpatient Hospital Stay: Payer: 59

## 2021-12-03 ENCOUNTER — Other Ambulatory Visit: Payer: Self-pay

## 2021-12-06 ENCOUNTER — Other Ambulatory Visit (HOSPITAL_COMMUNITY): Payer: Self-pay

## 2021-12-13 ENCOUNTER — Encounter: Payer: Self-pay | Admitting: Adult Health

## 2021-12-13 NOTE — Progress Notes (Signed)
Received email from patient regarding referral for food.  Emailed patient back to advise of [employer] and provided an expense sheet to reference to as to what [facility] funds can be used for.  Copied Selinda Eon in social work on email regarding the food referral.  Patient provided my number for any additional financial questions or concerns regarding [facility].

## 2021-12-14 ENCOUNTER — Telehealth: Payer: Self-pay

## 2021-12-14 NOTE — Telephone Encounter (Signed)
Notified Patient of completion of Disability Forms. Fax transmission confirmation received. Copy of forms sent to Patient via e-mail as requested. No other needs or concerns voiced at this time.

## 2021-12-16 NOTE — Progress Notes (Signed)
Location of Breast Cancer:  Malignant neoplasm of upper-outer quadrant of left breast in female, estrogen receptor positive  Histology per Pathology Report:  08/03/2021 A. 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 LYMPH NODE, LEFT AXILLARY #1, SENTINEL, EXCISION: - Lymph node, negative for carcinoma (0/1) C. LYMPH NODE, LEFT AXILLARY #2, SENTINEL, EXCISION: - Lymph node, negative for carcinoma (0/1) D. LYMPH NODE, LEFT AXILLARY, SENTINEL, EXCISION: - Lymph node, negative for carcinoma (0/1) E. LYMPH NODE, LEFT AXILLARY, SENTINEL, EXCISION: - Lymph node, negative for carcinoma (0/1) F. LYMPH NODE, LEFT AXILLARY, SENTINEL, EXCISION: - Lymph node, negative for carcinoma (0/1) G. LYMPH NODE, LEFT AXILLARY #3, SENTINEL, EXCISION: - Benign fibroadipose tissue, negative for carcinoma - Lymphoid tissue is not identified H. LYMPH NODE, TARGETED LEFT AXILLA, EXCISION: - Lymph node, negative for carcinoma (0/1)   Receptor Status: ER(15%), PR (Negative), Her2-neu (Negative via [place]), Ki-67(<5%)  Past/Anticipated interventions by surgeon, if any:  08/22/2021 --Dr. Autumn Messing (office visit)  The patient is about 2 weeks status post left breast lumpectomy for breast cancer. She tolerated the surgery well. At this point she will meet with medical and radiation oncology to discuss adjuvant therapy. I will plan to see her back in about 6 months.   08/03/2021 --Dr. Autumn Messing LEFT BREAST LUMPECTOMY WITH RADIOACTIVE SEED LOCALIZATION  DEEP LEFT AXILLARY SENTINEL LYMPH NODE BIOPSY  WITH RADIOACTIVE SEED GUIDED LEFT AXILLARY TARGETED LYMPH NODE DISSECTION   Past/Anticipated interventions by medical oncology, if any:  Under care of Dr. Arletha Pili  [place] 12/16/2021 She is here before anticipated fourth cycle of docetaxel and cyclophosphamide  Since her last visit, she describes some fatigue as well as sensation of jumping in her muscles.   She has mild neuropathy in her feet but this is not persistent and does not interfere with her activities of daily living.  Physical examination today unremarkable.  We have reviewed the labs, satisfactory to proceed with treatment.   At this time we have discussed about dose reduction but patient feels that her symptoms are mild enough that she does not want to proceed with dose reduction.   She will proceed with adjuvant radiation after chemotherapy and then return to clinic in about 8 weeks.   Lymphedema issues, if any:  Reports mild swelling to axilla area   Pain issues, if any:  Yes. Reports pain to axilla and opposite side of surgical site. Also reports pain with range of motion to left arm/shoulder  SAFETY ISSUES: Prior radiation? No Pacemaker/ICD? No Possible current pregnancy? No--hysterectomy Is the patient on methotrexate? No  Current Complaints / other details:  Continues to deal with fatigue and generalized muscle aches. Concerned about weight gain due to generalized weakness and difficulty feeling motivated to do physical activity

## 2021-12-19 NOTE — Progress Notes (Signed)
Radiation Oncology         [phone] ________________________________  Name: Darlene Peters MRN: [id number]  Date: 12/20/2021  DOB: May 02, 1970  Follow-Up Visit Note  Outpatient  CC: Sueanne Margarita, DO  Benay Pike, MD  Diagnosis:   No diagnosis found.   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     Since consultation date of 06/22/21, she underwent genetic testing on 07/18/21. Results showed no clinically significant variants detected by BRCAplus or +RNAinsight testing.  The patient opted to proceed with left breast lumpectomy with nodal biopsies on 08/03/21 under the care of Dr. Marlou Starks. Pathology from the procedure revealed: tumor size of 2.7 cm; histology of grade 2 invasive ductal carcinoma with intermediate to high-grade DCIS and focal necrosis; all margins negative for both invasive and in-situ carcinoma; margin status to invasive disease of <1 mm from the inferior margin; margin status to in situ disease of <1 mm from the inferior margin; nodal status of 6/6 left axillary lymph node excisions negative for carcinoma (5 sentinel and 1 targeted left axillary lymph node);  ER status: 20% positive and PR status 80% positive, both with strong staining intensity; Proliferation marker Ki67 at 20%; Her2 status negative; Grade 2.  Oncotype DX was obtained on the final surgical sample and the recurrence score of 28 predicts a risk of recurrence outside the breast over the next 9 years of 17%, if the patient's only systemic therapy is an antiestrogen for 5 years. It also predicts a significant benefit from chemotherapy.  Systemic therapy, if applicable, involved (dates and therapy as follows): The patient has  been treated with 4 cycles of adjuvant chemotherapy consisting of Taxotere and Cytoxan from 09/28/21 through 12/01/21 under the care of Dr. Chryl Heck. Chemo toxicities reported by the patient throughout the course of systemic treatment included some fatigue, a sensation of jumping in her muscles, intermittent abdominal cramping, sharp pains in her right breast, and mild neuropathy in her feet (not persistent and did not interfere with her ADL's). About 5 days after her first cycle of chemotherapy, the patient presented to the ED on 10/02/21 with abdominal pain and constipation x 3 days, palpitations, and sudden lightheadedness resulting in a loss of consciousness briefly. Blood cultures collected for additional work-up came back positive for bacillus and she was admitted for treatment. Following treatment, the patient was discharged on 10/05/21 in stable condition. Imaging performed while inpatient are detailed follows: -- CT AP with contrast on 10/02/21 showed no evidence of PE or other active disease within the thorax, no acute findings within the abdomen or pelvis, and no evidence of bowel obstruction or acute inflammatory process. (CT did show a mildly distended urinary bladder).   On 11/28/21, the patient presented with bilateral breast pain prompting a bilateral diagnostic mammogram and ultrasound on 11/28/21 which showed no evidence of new or recurrent breast carcinoma, no axillary lymphadenopathy, and no abnormalities in the right breast. Benign post-surgical changes were also seen in the left breast.   The patient will return to Dr. Chryl Heck in the near future to discuss antiestrogen treatment options following XRT.  Symptomatically, the patient reports: ***        ALLERGIES:  is allergic to shellfish allergy, fish allergy, gluten  meal, latex, other, and docetaxel.  Meds: Current Outpatient Medications  Medication Sig Dispense Refill   Acetaminophen (TYLENOL 8 HOUR PO) Take 1 tablet by mouth every 6  (six) hours as needed (pain).     albuterol (PROVENTIL HFA) 108 (90 Base) MCG/ACT inhaler Inhale 2 puffs into the lungs every 4 (four) hours as needed for wheezing or shortness of breath. 8.5 g 2   buPROPion (WELLBUTRIN XL) 300 MG 24 hr tablet Take 1 tablet  by mouth daily. 30 tablet 0   buPROPion (WELLBUTRIN) 75 MG tablet Take 1 tablet (75 mg total) by mouth 2 (two) times daily. 60 tablet 2   clobetasol (TEMOVATE) 0.05 % external solution Apply topically to affected areas twice daily as needed (not to face,groin,or axilla) 50 mL 3   desonide (DESOWEN) 0.05 % lotion Apply topically to affected areas up to  twice daily as needed (not to face,groin,or axilla) 60 mL 3   dexamethasone (DECADRON) 4 MG tablet Take 1 tablet (4 mg total) by mouth 2 (two) times daily. Start the day before Taxotere. Then again the day after chemo for 3 days. 30 tablet 1   docusate sodium (COLACE) 100 MG capsule Take 1 capsule (100 mg total) by mouth every 12 (twelve) hours. 60 capsule 0   famotidine (PEPCID) 20 MG tablet Take 1 tablet (20 mg total) by mouth 2 (two) times daily. 60 tablet 3   gabapentin (NEURONTIN) 100 MG capsule Take 1 capsule (100 mg total) by mouth 3 (three) times daily. 90 capsule 2   levocetirizine (XYZAL) 5 MG tablet Take 1 tablet (5 mg total) by mouth every evening. 30 tablet 5   lidocaine-prilocaine (EMLA) cream Apply to affected area once. 30 g 3   polyethylene glycol (MIRALAX / GLYCOLAX) 17 g packet Take 17 g by mouth daily. 14 each 0   prochlorperazine (COMPAZINE) 10 MG tablet Take 1 tablet (10 mg total) by mouth every 6 (six) hours as needed for nausea or vomiting. 30 tablet 1   triamcinolone cream (KENALOG) 0.1 % Apply topically to affected areas twice daily as needed (not to face,groin,or axilla) (Patient not taking: Reported on 10/02/2021) 453.6 g 3   No current facility-administered medications for this encounter.    Physical Findings:  vitals were not taken for this visit. .     General:  Alert and oriented, in no acute distress HEENT: Head is normocephalic. Extraocular movements are intact. Oropharynx is clear. Neck: Neck is supple, no palpable cervical or supraclavicular lymphadenopathy. Heart: Regular in rate and rhythm with no murmurs, rubs, or gallops. Chest: Clear to auscultation bilaterally, with no rhonchi, wheezes, or rales. Abdomen: Soft, nontender, nondistended, with no rigidity or guarding. Extremities: No cyanosis or edema. Lymphatics: see Neck Exam Musculoskeletal: symmetric strength and muscle tone throughout. Neurologic: No obvious focalities. Speech is fluent.  Psychiatric: Judgment and insight are intact. Affect is appropriate. Breast exam reveals ***  Lab Findings: Lab Results  Component Value Date   WBC 5.0 12/01/2021   HGB 10.5 (L) 12/01/2021   HCT 31.1 (L) 12/01/2021   MCV 95.1 12/01/2021   PLT 212 12/01/2021    _0 @  Radiographic Findings: MM DIAG BREAST TOMO BILATERAL  Result Date: 11/28/2021 CLINICAL DATA:  Patient presents with bilateral areas of breast pain, the upper outer left breast, and on the right, mostly medial. Patient underwent a left lumpectomy and lymph node dissection in May 2023 for breast carcinoma. EXAM: DIGITAL DIAGNOSTIC BILATERAL MAMMOGRAM WITH TOMOSYNTHESIS; ULTRASOUND LEFT BREAST LIMITED; ULTRASOUND RIGHT  BREAST LIMITED TECHNIQUE: Bilateral digital diagnostic mammography and breast tomosynthesis was performed.; Targeted ultrasound examination of the left breast was performed.; Targeted ultrasound examination of the right breast was performed COMPARISON:  Previous exam(s). ACR Breast Density Category c: The breast tissue is heterogeneously dense, which may obscure small masses. FINDINGS: There are post surgical changes in the central to lateral left breast, and left axilla. There are no breast masses, areas of nonsurgical architectural distortion, suspicious areas of asymmetry or suspicious calcifications. Targeted  left breast ultrasound is performed, showing normal fibroglandular tissue in the area of the palpable abnormality, lateral left breast, as well the inferior left axilla. No mass or suspicious lesion. Targeted right breast ultrasound is performed, showing normal fibroglandular tissue in the medial right breast, lower inner quadrant, in area of focal pain. No mass or suspicious lesion. IMPRESSION: 1. No evidence of new or recurrent breast carcinoma. 2. Benign post surgical changes on the left. RECOMMENDATION: 1. Diagnostic mammography in 1 year per standard post lumpectomy protocol. I have discussed the findings and recommendations with the patient. If applicable, a reminder letter will be sent to the patient regarding the next appointment. BI-RADS CATEGORY  2: Benign. Electronically Signed   By: Lajean Manes M.D.   On: 11/28/2021 15:31  US BREAST LTD UNI RIGHT INC AXILLA  Result Date: 11/28/2021 CLINICAL DATA:  Patient presents with bilateral areas of breast pain, the upper outer left breast, and on the right, mostly medial. Patient underwent a left lumpectomy and lymph node dissection in May 2023 for breast carcinoma. EXAM: DIGITAL DIAGNOSTIC BILATERAL MAMMOGRAM WITH TOMOSYNTHESIS; ULTRASOUND LEFT BREAST LIMITED; ULTRASOUND RIGHT BREAST LIMITED TECHNIQUE: Bilateral digital diagnostic mammography and breast tomosynthesis was performed.; Targeted ultrasound examination of the left breast was performed.; Targeted ultrasound examination of the right breast was performed COMPARISON:  Previous exam(s). ACR Breast Density Category c: The breast tissue is heterogeneously dense, which may obscure small masses. FINDINGS: There are post surgical changes in the central to lateral left breast, and left axilla. There are no breast masses, areas of nonsurgical architectural distortion, suspicious areas of asymmetry or suspicious calcifications. Targeted left breast ultrasound is performed, showing normal fibroglandular tissue  in the area of the palpable abnormality, lateral left breast, as well the inferior left axilla. No mass or suspicious lesion. Targeted right breast ultrasound is performed, showing normal fibroglandular tissue in the medial right breast, lower inner quadrant, in area of focal pain. No mass or suspicious lesion. IMPRESSION: 1. No evidence of new or recurrent breast carcinoma. 2. Benign post surgical changes on the left. RECOMMENDATION: 1. Diagnostic mammography in 1 year per standard post lumpectomy protocol. I have discussed the findings and recommendations with the patient. If applicable, a reminder letter will be sent to the patient regarding the next appointment. BI-RADS CATEGORY  2: Benign. Electronically Signed   By: Lajean Manes M.D.   On: 11/28/2021 15:31  US BREAST LTD UNI LEFT INC AXILLA  Result Date: 11/28/2021 CLINICAL DATA:  Patient presents with bilateral areas of breast pain, the upper outer left breast, and on the right, mostly medial. Patient underwent a left lumpectomy and lymph node dissection in May 2023 for breast carcinoma. EXAM: DIGITAL DIAGNOSTIC BILATERAL MAMMOGRAM WITH TOMOSYNTHESIS; ULTRASOUND LEFT BREAST LIMITED; ULTRASOUND RIGHT BREAST LIMITED TECHNIQUE: Bilateral digital diagnostic mammography and breast tomosynthesis was performed.; Targeted ultrasound examination of the left breast was performed.; Targeted ultrasound examination of the right breast was performed COMPARISON:  Previous exam(s). ACR Breast Density Category c: The breast tissue  is heterogeneously dense, which may obscure small masses. FINDINGS: There are post surgical changes in the central to lateral left breast, and left axilla. There are no breast masses, areas of nonsurgical architectural distortion, suspicious areas of asymmetry or suspicious calcifications. Targeted left breast ultrasound is performed, showing normal fibroglandular tissue in the area of the palpable abnormality, lateral left breast, as well the  inferior left axilla. No mass or suspicious lesion. Targeted right breast ultrasound is performed, showing normal fibroglandular tissue in the medial right breast, lower inner quadrant, in area of focal pain. No mass or suspicious lesion. IMPRESSION: 1. No evidence of new or recurrent breast carcinoma. 2. Benign post surgical changes on the left. RECOMMENDATION: 1. Diagnostic mammography in 1 year per standard post lumpectomy protocol. I have discussed the findings and recommendations with the patient. If applicable, a reminder letter will be sent to the patient regarding the next appointment. BI-RADS CATEGORY  2: Benign. Electronically Signed   By: Lajean Manes M.D.   On: 11/28/2021 15:31   Impression/Plan: We discussed adjuvant radiotherapy today.  I recommend *** in order to ***.  I reviewed the logistics, benefits, risks, and potential side effects of this treatment in detail. Risks may include but not necessary be limited to acute and late injury tissue in the radiation fields such as skin irritation (change in color/pigmentation, itching, dryness, pain, peeling). She may experience fatigue. We also discussed possible risk of long term cosmetic changes or scar tissue. There is also a smaller risk for lung toxicity, ***cardiac toxicity, ***brachial plexopathy, ***lymphedema, ***musculoskeletal changes, ***rib fragility or ***induction of a second malignancy, ***late chronic non-healing soft tissue wound.    The patient asked good questions which I answered to her satisfaction. She is enthusiastic about proceeding with treatment. A consent form has been *** signed and placed in her chart.  A total of *** medically necessary complex treatment devices will be fabricated and supervised by me: *** fields with MLCs for custom blocks to protect heart, and lungs;  and, a Vac-lok. MORE COMPLEX DEVICES MAY BE MADE IN DOSIMETRY FOR FIELD IN FIELD BEAMS FOR DOSE HOMOGENEITY.  I have requested : 3D Simulation which  is medically necessary to give adequate dose to at risk tissues while sparing lungs and heart.  I have requested a DVH of the following structures: lungs, heart, *** lumpectomy cavity.    The patient will receive *** Gy in *** fractions to the *** with *** fields.  This will be *** followed by a boost.  On date of service, in total, I spent *** minutes on this encounter. Patient was seen in person.  _____________________________________   Eppie Gibson, MD  This document serves as a record of services personally performed by Eppie Gibson, MD. It was created on her behalf by Roney Mans, a trained medical scribe. The creation of this record is based on the scribe's personal observations and the provider's statements to them. This document has been checked and approved by the attending provider.

## 2021-12-20 ENCOUNTER — Encounter: Payer: Self-pay | Admitting: Radiation Oncology

## 2021-12-20 ENCOUNTER — Ambulatory Visit
Admission: RE | Admit: 2021-12-20 | Discharge: 2021-12-20 | Disposition: A | Discharge status: Home / Self Care | Payer: 59 | Source: Ambulatory Visit | Attending: Radiation Oncology | Admitting: Radiation Oncology

## 2021-12-20 ENCOUNTER — Other Ambulatory Visit: Payer: Self-pay

## 2021-12-20 VITALS — BP 138/73 | HR 103 | Temp 97.1°F | Resp 18 | Ht 69.0 in | Wt 206.5 lb

## 2021-12-20 DIAGNOSIS — T451X5A Adverse effect of antineoplastic and immunosuppressive drugs, initial encounter: Secondary | ICD-10-CM | POA: Diagnosis not present

## 2021-12-20 DIAGNOSIS — N644 Mastodynia: Secondary | ICD-10-CM | POA: Insufficient documentation

## 2021-12-20 DIAGNOSIS — Z170 Estrogen receptor positive status [ER+]: Secondary | ICD-10-CM | POA: Insufficient documentation

## 2021-12-20 DIAGNOSIS — C50412 Malignant neoplasm of upper-outer quadrant of left female breast: Secondary | ICD-10-CM | POA: Insufficient documentation

## 2021-12-20 DIAGNOSIS — R253 Fasciculation: Secondary | ICD-10-CM | POA: Diagnosis not present

## 2021-12-20 DIAGNOSIS — Z7952 Long term (current) use of systemic steroids: Secondary | ICD-10-CM | POA: Insufficient documentation

## 2021-12-20 DIAGNOSIS — Z510 Encounter for antineoplastic radiation therapy: Secondary | ICD-10-CM | POA: Insufficient documentation

## 2021-12-20 DIAGNOSIS — Z9221 Personal history of antineoplastic chemotherapy: Secondary | ICD-10-CM | POA: Diagnosis not present

## 2021-12-20 DIAGNOSIS — M25619 Stiffness of unspecified shoulder, not elsewhere classified: Secondary | ICD-10-CM | POA: Diagnosis not present

## 2021-12-20 DIAGNOSIS — G620 Drug-induced polyneuropathy: Secondary | ICD-10-CM | POA: Diagnosis not present

## 2021-12-20 DIAGNOSIS — Z79899 Other long term (current) drug therapy: Secondary | ICD-10-CM | POA: Insufficient documentation

## 2021-12-20 DIAGNOSIS — R5383 Other fatigue: Secondary | ICD-10-CM | POA: Diagnosis not present

## 2021-12-20 DIAGNOSIS — R2689 Other abnormalities of gait and mobility: Secondary | ICD-10-CM | POA: Diagnosis not present

## 2021-12-22 ENCOUNTER — Telehealth: Payer: Self-pay | Admitting: Hematology and Oncology

## 2021-12-22 NOTE — Telephone Encounter (Signed)
Scheduled appointment per 10/18 secure chat. Patient is aware.

## 2021-12-25 NOTE — Progress Notes (Unsigned)
[facility] Follow up:    Darlene Margarita, DO [address] [place] [address]   DIAGNOSIS: Cancer Staging  Malignant neoplasm of upper-outer quadrant of left breast in female, estrogen receptor positive ([place]) Staging form: Breast, AJCC 8th Edition - Clinical stage from 06/22/2021: Stage IIA (cT2, cN0, cM0, G2, ER+, PR-, HER2-) - Unsigned Stage prefix: Initial diagnosis Histologic grading system: 3 grade system - Pathologic stage from 12/20/2021: Stage IA (pT2, pN0, cM0, G2, ER+, PR+, HER2-) - Signed by Eppie Gibson, MD on 12/20/2021 Stage prefix: Initial diagnosis Histologic grading system: 3 grade system   SUMMARY OF ONCOLOGIC HISTORY: Oncology History  Malignant neoplasm of upper-outer quadrant of left breast in female, estrogen receptor positive ([place])  06/14/2021 Initial Diagnosis   Malignant neoplasm of upper-outer quadrant of left breast in female, estrogen receptor positive ([place])   07/26/2021 Genetic Testing   Negative hereditary cancer genetic testing: no pathogenic variants detected in [facility] BRCAPlus Panel and CancerNext +RNAinsight Panel.  Report dates are Jul 26, 2021 and August 08, 2021.   The BRCAplus panel offered by [employer] and includes sequencing and deletion/duplication analysis for the following 8 genes: ATM, BRCA1, BRCA2, CDH1, CHEK2, PALB2, PTEN, and TP53.  The CancerNext gene panel offered by [employer] includes sequencing, rearrangement analysis, and RNA analysis for the following 36 genes:   APC, ATM, AXIN2, BARD1, BMPR1A, BRCA1, BRCA2, BRIP1, CDH1, CDK4, CDKN2A, CHEK2, DICER1, HOXB13, EPCAM, GREM1, MLH1, MSH2, MSH3, MSH6, MUTYH, NBN, NF1, NTHL1, PALB2, PMS2, POLD1, POLE, PTEN, RAD51C, RAD51D, RECQL, SMAD4, SMARCA4, STK11, and TP53.    08/03/2021 Definitive Surgery   She had left breast lumpectomy with invasive ductal carcinoma, 2.7 cm, grade 2, resection margins negative, all sentinel lymph nodes negative, prior prognostics ER 15%  positive moderate staining, PR 0% negative, HER2 negative, Ki-67 of 5%. Oncotype score of 28, distant recurrence risk at 9 years of 17%, absolute benefit of chemotherapy greater than 15% on Oncotype she tested positive for ER and PR and HER2 negative   09/28/2021 - 10/20/2021 Chemotherapy   Patient is on Treatment Plan : BREAST TC q21d     09/28/2021 -  Chemotherapy   Patient is on Treatment Plan : BREAST TC q21d     12/20/2021 Cancer Staging   Staging form: Breast, AJCC 8th Edition - Pathologic stage from 12/20/2021: Stage IA (pT2, pN0, cM0, G2, ER+, PR+, HER2-) - Signed by Eppie Gibson, MD on 12/20/2021 Stage prefix: Initial diagnosis Histologic grading system: 3 grade system     CURRENT THERAPY: Taxotere/Cytoxan  INTERVAL HISTORY: Darlene Peters 51 y.o. female returns for    Patient Active Problem List   Diagnosis Date Noted   Port-A-Cath in place 10/20/2021   Bacteremia 10/03/2021   Syncope 10/03/2021   Constipation 10/03/2021   Genetic testing 07/27/2021   Family history of breast cancer 07/19/2021   Family history of prostate cancer 07/19/2021   Anxiety 06/22/2021   Difficulty sleeping 06/22/2021   Major depressive disorder, single episode, unspecified 06/22/2021   Malignant neoplasm of upper-outer quadrant of left breast in female, estrogen receptor positive ([place]) 06/14/2021   Other fatigue 11/05/2019   Psoriasis 10/14/2019   Gastroesophageal reflux disease without esophagitis 05/01/2019   Intermittent asthma 07/05/2018   Low back pain 01/11/2017    is allergic to shellfish allergy, fish allergy, gluten meal, latex, other, and docetaxel.  MEDICAL HISTORY: Past Medical History:  Diagnosis Date   Asthma    "worse when lying down"   Breast cancer (  [place])    Family history of breast cancer 07/19/2021   Family history of prostate cancer 07/19/2021   GERD (gastroesophageal reflux disease)    Psoriasis    Urticaria     SURGICAL HISTORY: Past Surgical History:   Procedure Laterality Date   ABDOMINAL HYSTERECTOMY     partial   BREAST BIOPSY Left 05/26/2021   BREAST LUMPECTOMY WITH RADIOACTIVE SEED AND SENTINEL LYMPH NODE BIOPSY Left 08/03/2021   Procedure: LEFT BREAST LUMPECTOMY WITH RADIOACTIVE SEED AND SENTINEL LYMPH NODE BIOPSY;  Surgeon: Jovita Kussmaul, MD;  Location: [facility];  Service: General;  Laterality: Left;   IR IMAGING GUIDED PORT INSERTION  09/26/2021   RADIOACTIVE SEED GUIDED AXILLARY SENTINEL LYMPH NODE Left 08/03/2021   Procedure: RADIOACTIVE SEED GUIDED LEFT AXILLARY SENTINEL LYMPH NODE DISSECTION;  Surgeon: Jovita Kussmaul, MD;  Location: [facility];  Service: General;  Laterality: Left;   TUMOR REMOVAL Left 08/2021   breast    SOCIAL HISTORY: Social History   Socioeconomic History   Marital status: Married    Spouse name: Not on file   Number of children: Not on file   Years of education: Not on file   Highest education level: Not on file  Occupational History   Not on file  Tobacco Use   Smoking status: Never    Passive exposure: Never   Smokeless tobacco: Never  Vaping Use   Vaping Use: Never used  Substance and Sexual Activity   Alcohol use: No   Drug use: No   Sexual activity: Yes    Birth control/protection: Surgical  Other Topics Concern   Not on file  Social History Narrative   Not on file   Social Determinants of Health   Financial Resource Strain: High Risk (07/14/2021)   Overall Financial Resource Strain (CARDIA)    Difficulty of Paying Living Expenses: Hard  Food Insecurity: Not on file  Transportation Needs: Unmet Transportation Needs (10/19/2021)   PRAPARE - Transportation    Lack of Transportation (Medical): Yes    Lack of Transportation (Non-Medical): No  Physical Activity: Not on file  Stress: Not on file  Social Connections: Not on file  Intimate Partner Violence: Not on file    FAMILY HISTORY: Family History  Problem Relation Age of Onset   Allergic  rhinitis Mother    Allergic rhinitis Sister    Allergic rhinitis Sister    Breast cancer Maternal Aunt 45   Prostate cancer Maternal Uncle        mets; dx after 50   Prostate cancer Paternal Uncle    Leukemia Paternal Uncle    Prostate cancer Paternal Uncle 67   Gastric cancer Paternal Uncle    Skin cancer Maternal Grandmother 13   Breast cancer Cousin 2       maternal cousin   Cervical cancer Cousin    Breast cancer Cousin        dx 13s; maternal female cousin   Breast cancer Cousin        paternal female cousin x2    Review of Systems  Constitutional:  Negative for appetite change, chills, fatigue, fever and unexpected weight change.  HENT:   Negative for hearing loss, lump/mass and trouble swallowing.   Eyes:  Negative for eye problems and icterus.  Respiratory:  Negative for chest tightness, cough and shortness of breath.   Cardiovascular:  Negative for chest pain, leg swelling and palpitations.  Gastrointestinal:  Negative for abdominal distention, abdominal pain,  constipation, diarrhea, nausea and vomiting.  Endocrine: Negative for hot flashes.  Genitourinary:  Negative for difficulty urinating.   Musculoskeletal:  Negative for arthralgias.  Skin:  Negative for itching and rash.  Neurological:  Negative for dizziness, extremity weakness, headaches and numbness.  Hematological:  Negative for adenopathy. Does not bruise/bleed easily.  Psychiatric/Behavioral:  Negative for depression. The patient is not nervous/anxious.       PHYSICAL EXAMINATION  ECOG PERFORMANCE STATUS: {CHL ONC ECOG PS:[id number]}  There were no vitals filed for this visit.  Physical Exam Constitutional:      General: She is not in acute distress.    Appearance: Normal appearance. She is not toxic-appearing.  HENT:     Head: Normocephalic and atraumatic.  Eyes:     General: No scleral icterus. Cardiovascular:     Rate and Rhythm: Normal rate and regular rhythm.     Pulses: Normal pulses.      Heart sounds: Normal heart sounds.  Pulmonary:     Effort: Pulmonary effort is normal.     Breath sounds: Normal breath sounds.  Abdominal:     General: Abdomen is flat. Bowel sounds are normal. There is no distension.     Palpations: Abdomen is soft.     Tenderness: There is no abdominal tenderness.  Musculoskeletal:        General: No swelling.     Cervical back: Neck supple.  Lymphadenopathy:     Cervical: No cervical adenopathy.  Skin:    General: Skin is warm and dry.     Findings: No rash.  Neurological:     General: No focal deficit present.     Mental Status: She is alert.  Psychiatric:        Mood and Affect: Mood normal.        Behavior: Behavior normal.     LABORATORY DATA:  CBC    Component Value Date/Time   WBC 5.0 12/01/2021 0831   WBC 8.1 10/05/2021 0529   RBC 3.27 (L) 12/01/2021 0831   HGB 10.5 (L) 12/01/2021 0831   HGB 14.0 08/09/2021 1505   HCT 31.1 (L) 12/01/2021 0831   HCT 40.9 08/09/2021 1505   PLT 212 12/01/2021 0831   MCV 95.1 12/01/2021 0831   MCV 91 08/09/2021 1505   MCH 32.1 12/01/2021 0831   MCHC 33.8 12/01/2021 0831   RDW 16.0 (H) 12/01/2021 0831   RDW 13.2 08/09/2021 1505   LYMPHSABS 0.8 12/01/2021 0831   LYMPHSABS 1.0 08/09/2021 1505   MONOABS 0.5 12/01/2021 0831   EOSABS 0.0 12/01/2021 0831   EOSABS 0.2 08/09/2021 1505   BASOSABS 0.0 12/01/2021 0831   BASOSABS 0.0 08/09/2021 1505    CMP     Component Value Date/Time   NA 141 12/01/2021 0831   NA 140 08/09/2021 1505   K 3.5 12/01/2021 0831   CL 108 12/01/2021 0831   CO2 27 12/01/2021 0831   GLUCOSE 95 12/01/2021 0831   BUN 7 12/01/2021 0831   BUN 11 08/09/2021 1505   CREATININE 0.51 12/01/2021 0831   CALCIUM 8.6 (L) 12/01/2021 0831   PROT 5.8 (L) 12/01/2021 0831   PROT 6.5 08/09/2021 1505   ALBUMIN 3.6 12/01/2021 0831   ALBUMIN 4.3 08/09/2021 1505   AST 22 12/01/2021 0831   ALT 33 12/01/2021 0831   ALKPHOS 42 12/01/2021 0831   BILITOT 0.9 12/01/2021 0831    GFRNONAA >60 12/01/2021 0831   GFRAA >60 04/28/2018 3893       PENDING LABS:  RADIOGRAPHIC STUDIES:  No results found.   PATHOLOGY:     ASSESSMENT and THERAPY PLAN:   No problem-specific Assessment & Plan notes found for this encounter.   No orders of the defined types were placed in this encounter.   All questions were answered. The patient knows to call the clinic with any problems, questions or concerns. We can certainly see the patient much sooner if necessary. This note was electronically signed. Scot Dock, NP 12/25/2021

## 2021-12-26 ENCOUNTER — Inpatient Hospital Stay: Payer: 59 | Attending: Hematology and Oncology

## 2021-12-26 ENCOUNTER — Encounter: Payer: Self-pay | Admitting: Adult Health

## 2021-12-26 ENCOUNTER — Inpatient Hospital Stay (HOSPITAL_BASED_OUTPATIENT_CLINIC_OR_DEPARTMENT_OTHER): Payer: 59 | Admitting: Adult Health

## 2021-12-26 VITALS — BP 125/81 | HR 102 | Temp 98.0°F | Resp 16 | Ht 69.0 in | Wt 209.5 lb

## 2021-12-26 DIAGNOSIS — R5383 Other fatigue: Secondary | ICD-10-CM | POA: Insufficient documentation

## 2021-12-26 DIAGNOSIS — R253 Fasciculation: Secondary | ICD-10-CM | POA: Insufficient documentation

## 2021-12-26 DIAGNOSIS — T451X5A Adverse effect of antineoplastic and immunosuppressive drugs, initial encounter: Secondary | ICD-10-CM | POA: Insufficient documentation

## 2021-12-26 DIAGNOSIS — Z170 Estrogen receptor positive status [ER+]: Secondary | ICD-10-CM

## 2021-12-26 DIAGNOSIS — C50412 Malignant neoplasm of upper-outer quadrant of left female breast: Secondary | ICD-10-CM | POA: Insufficient documentation

## 2021-12-26 DIAGNOSIS — G620 Drug-induced polyneuropathy: Secondary | ICD-10-CM

## 2021-12-26 DIAGNOSIS — Z510 Encounter for antineoplastic radiation therapy: Secondary | ICD-10-CM | POA: Insufficient documentation

## 2021-12-26 DIAGNOSIS — R2689 Other abnormalities of gait and mobility: Secondary | ICD-10-CM

## 2021-12-26 DIAGNOSIS — M25619 Stiffness of unspecified shoulder, not elsewhere classified: Secondary | ICD-10-CM | POA: Insufficient documentation

## 2021-12-26 LAB — CMP (CANCER CENTER ONLY)
ALT: 34 U/L (ref 0–44)
AST: 26 U/L (ref 15–41)
Albumin: 3.6 g/dL (ref 3.5–5.0)
Alkaline Phosphatase: 39 U/L (ref 38–126)
Anion gap: 4 — ABNORMAL LOW (ref 5–15)
BUN: 8 mg/dL (ref 6–20)
CO2: 27 mmol/L (ref 22–32)
Calcium: 8.9 mg/dL (ref 8.9–10.3)
Chloride: 108 mmol/L (ref 98–111)
Creatinine: 0.55 mg/dL (ref 0.44–1.00)
GFR, Estimated: >60 mL/min (ref >60)
Glucose, Bld: 88 mg/dL (ref 70–99)
Potassium: 3.8 mmol/L (ref 3.5–5.1)
Sodium: 139 mmol/L (ref 135–145)
Total Bilirubin: 0.8 mg/dL (ref 0.3–1.2)
Total Protein: 5.9 g/dL — ABNORMAL LOW (ref 6.5–8.1)

## 2021-12-26 LAB — CBC WITH DIFFERENTIAL (CANCER CENTER ONLY)
Abs Immature Granulocytes: 0.02 10*3/uL (ref 0.00–0.07)
Basophils Absolute: 0 10*3/uL (ref 0.0–0.1)
Basophils Relative: 1 %
Eosinophils Absolute: 0.2 10*3/uL (ref 0.0–0.5)
Eosinophils Relative: 3 %
HCT: 34 % — ABNORMAL LOW (ref 36.0–46.0)
Hemoglobin: 11.3 g/dL — ABNORMAL LOW (ref 12.0–15.0)
Immature Granulocytes: 0 %
Lymphocytes Relative: 15 %
Lymphs Abs: 0.7 10*3/uL (ref 0.7–4.0)
MCH: 32.5 pg (ref 26.0–34.0)
MCHC: 33.2 g/dL (ref 30.0–36.0)
MCV: 97.7 fL (ref 80.0–100.0)
Monocytes Absolute: 0.4 10*3/uL (ref 0.1–1.0)
Monocytes Relative: 8 %
Neutro Abs: 3.6 10*3/uL (ref 1.7–7.7)
Neutrophils Relative %: 73 %
Platelet Count: 228 10*3/uL (ref 150–400)
RBC: 3.48 MIL/uL — ABNORMAL LOW (ref 3.87–5.11)
RDW: 16 % — ABNORMAL HIGH (ref 11.5–15.5)
WBC Count: 4.9 10*3/uL (ref 4.0–10.5)
nRBC: 0 % (ref 0.0–0.2)

## 2021-12-26 LAB — VITAMIN B12: Vitamin B-12: 829 pg/mL (ref 180–914)

## 2021-12-26 LAB — MAGNESIUM: Magnesium: 1.9 mg/dL (ref 1.7–2.4)

## 2021-12-26 LAB — VITAMIN D 25 HYDROXY (VIT D DEFICIENCY, FRACTURES): Vit D, 25-Hydroxy: 16.9 ng/mL — ABNORMAL LOW (ref 30–100)

## 2021-12-26 NOTE — Assessment & Plan Note (Signed)
Corky Sing is a 51 year old woman with history of stage Ia estrogen progesterone positive breast cancer diagnosed in April 2023.  She is status post left breast lumpectomy, adjuvant chemotherapy, and will begin adjuvant radiation on Wednesday, October 25.  She is having a multitude of side effects from her final cycle of chemotherapy.  To evaluate the issue of her eye twitching and muscle twitching we will get a full set of labs including CBC c-Met magnesium B12 vitamin D level.  This will also help understand her continued fatigue and pain along with her neuropathy.  For the pain that she is experiencing I recommended that she take 1 extra strength Tylenol and 1 Aleve 3 times a day as needed for pain.  I wrote this out in detail for her.  The watering eyes is a side effect from chemotherapy.  She has previously been to the eye doctor and she tells me that they did not say anything about her lacrimal ducts.  I recommended that she use Systane eyedrops 4 times a day.  [place] has follow-up with [place] in 4 weeks with Dr. Chryl Heck, after her visit with Dr. Chryl Heck we will then see her for survivorship.

## 2021-12-27 NOTE — Therapy (Signed)
OUTPATIENT PHYSICAL THERAPY ONCOLOGY EVALUATION  Patient Name: Darlene Peters MRN: [id number] DOB:04/19/1970, 51 y.o., female Today's Date: 12/28/2021   PT End of Session - 12/28/21 1557     Visit Number 1    Number of Visits 18    Date for PT Re-Evaluation 02/08/22    PT Start Time 1558    PT Stop Time 1650    PT Time Calculation (min) 52 min    Activity Tolerance Patient tolerated treatment well    Behavior During Therapy [facility] for tasks assessed/performed             Past Medical History:  Diagnosis Date   Asthma    "worse when lying down"   Breast cancer ([place])    Family history of breast cancer 07/19/2021   Family history of prostate cancer 07/19/2021   GERD (gastroesophageal reflux disease)    Psoriasis    Urticaria    Past Surgical History:  Procedure Laterality Date   ABDOMINAL HYSTERECTOMY     partial   BREAST BIOPSY Left 05/26/2021   BREAST LUMPECTOMY WITH RADIOACTIVE SEED AND SENTINEL LYMPH NODE BIOPSY Left 08/03/2021   Procedure: LEFT BREAST LUMPECTOMY WITH RADIOACTIVE SEED AND SENTINEL LYMPH NODE BIOPSY;  Surgeon: Jovita Kussmaul, MD;  Location: [facility];  Service: General;  Laterality: Left;   IR IMAGING GUIDED PORT INSERTION  09/26/2021   RADIOACTIVE SEED GUIDED AXILLARY SENTINEL LYMPH NODE Left 08/03/2021   Procedure: RADIOACTIVE SEED GUIDED LEFT AXILLARY SENTINEL LYMPH NODE DISSECTION;  Surgeon: Jovita Kussmaul, MD;  Location: [facility];  Service: General;  Laterality: Left;   TUMOR REMOVAL Left 08/2021   breast   Patient Active Problem List   Diagnosis Date Noted   Port-A-Cath in place 10/20/2021   Bacteremia 10/03/2021   Syncope 10/03/2021   Constipation 10/03/2021   Genetic testing 07/27/2021   Family history of breast cancer 07/19/2021   Family history of prostate cancer 07/19/2021   Anxiety 06/22/2021   Difficulty sleeping 06/22/2021   Major depressive disorder, single episode, unspecified 06/22/2021    Malignant neoplasm of upper-outer quadrant of left breast in female, estrogen receptor positive ([place]) 06/14/2021   Other fatigue 11/05/2019   Psoriasis 10/14/2019   Gastroesophageal reflux disease without esophagitis 05/01/2019   Intermittent asthma 07/05/2018   Low back pain 01/11/2017    PCP: Maebelle Munroe, DO  REFERRING PROVIDER: Wilber Bihari NP  REFERRING DIAG: s/p Left Breast Cancer  THERAPY DIAG:  Malignant neoplasm of upper-outer quadrant of left breast in female, estrogen receptor positive ([place])  Stiffness of left shoulder, not elsewhere classified  Paclitaxel induced neuropathy ([place])  Loss of balance  ONSET DATE: 06/14/2021  Rationale for Evaluation and Treatment Rehabilitation  SUBJECTIVE:  SUBJECTIVE STATEMENT: Pts. Is having pain in her bones, and muscles, especially her glut muscles. It has not improved with exercises. She is experiencing "jumping" in her muscles from the chemo in the stomach area,quads and triceps on left. The stomach jumping is constant.  The finger tips on the right and lateral 3 finger tips on the left hurt from chemo so she can not use them as well. Typing hurts. She has trouble with gripping. Her first 2 toes are numb but don't really hurt. Her fingers hurt. She is on a low dose of gabapentin but it doesn't seem to help. She is taking Naproxen/Tylenol for pain. Its still tight under the left axillary region with ER to get into radiation position. I feel like I am having balance problems too.  PERTINENT HISTORY:  Patient was diagnosed on 05/31/2021 with left Breast Cancer. It measures 2.6 cm and is located in the upper-outer quadrant. It is ER+,PR-, HER 2 - with a Ki67 of <5%.  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 0/6 LN. She had adjuvant chemotherapy with Taxotere/Cytoxan every 21 Days x 4 cycles ending on 12/01/2021, and adjuvant radiation starting on 12/28/2021. She will be having 4 weeks or radiation.   PAIN:  Are you having pain? Yes NPRS scale: 5/10 Pain location: bilateral hands, upperback/ Left shoulder blade Pain orientation: Left  PAIN TYPE: dull Pain description: constant  Aggravating factors: lying down, rising from sitting, walking Relieving factors: massager on legs, shoulder blade, stretching, aquatics  PRECAUTIONS: Left UE lymphedema risk, neuropathy, psoriasis  WEIGHT BEARING RESTRICTIONS: No  FALLS:  Has patient fallen in last 6 months? Yes. Number of falls 1  LIVING ENVIRONMENT: Lives with: lives with their spouse Lives in: House/apartment Stairs: Yes; External: 3 steps; on left going up Has following equipment at home: None  OCCUPATION: Not presently working  LEISURE: walking,  reading  HAND DOMINANCE: right   PRIOR LEVEL OF FUNCTION: Independent  PATIENT GOALS: Get stronger, less pain,more stamina and balance, more energy   OBJECTIVE:  COGNITION: Overall cognitive status: Within functional limits for tasks assessed   PALPATION: NT  OBSERVATIONS / OTHER ASSESSMENTS: slumped sitting posture, with gait normal gait over 50 feet with one step off center before returning  SENSATION: Light touch: Deficits back of arm  POSTURE: forward head, rounded shoulders  UPPER EXTREMITY AROM/PROM:  A/PROM RIGHT   eval   Shoulder extension 64  Shoulder flexion 165  Shoulder abduction 180  Shoulder internal rotation 75  Shoulder external rotation 110    (Blank rows = not tested)  A/PROM LEFT   eval  Shoulder extension 65  Shoulder flexion 150  Shoulder abduction 170  Shoulder internal rotation 65  Shoulder external rotation 105    (Blank rows = not tested)  CERVICAL AROM: All within functional limits:     UPPER  EXTREMITY STRENGTH: NT   LOWER EXTREMITY AROM/PROM:  A/PROM Right eval  Hip flexion 4  Hip extension   Hip abduction 5  Hip adduction 4  Hip internal rotation 4-  Hip external rotation 3+  Knee flexion 5  Knee extension 4  Ankle dorsiflexion 5  Ankle plantarflexion 5  Ankle inversion   Ankle eversion    (Blank rows = not tested)  A/PROM LEFT eval  Hip flexion 4  Hip extension   Hip abduction 5 seated  Hip adduction 4+ seated  Hip internal rotation 4  Hip external rotation 4  Knee flexion 5  Knee extension 4+  Ankle dorsiflexion 5  Ankle plantarflexion 5  Ankle inversion   Ankle eversion    (Blank rows = not tested)  LOWER EXTREMITY MMT: see above  LYMPHEDEMA ASSESSMENTS:   SURGERY TYPE/DATE: 08/03/2021 Left Lumpectomy with deep axillary SLNB  NUMBER OF LYMPH NODES REMOVED: 0/6  CHEMOTHERAPY: 4 cycles, ended 12/01/2021  RADIATION:to start 12/28/2021  HORMONE TREATMENT: will start after radiation  INFECTIONS: Bacterial infection in blood in July 2023  LYMPHEDEMA ASSESSMENTS:   LANDMARK RIGHT  eval  10 cm proximal to olecranon process 32.3  Olecranon process 26.6  10 cm proximal to ulnar styloid process 20.8  Just proximal to ulnar styloid process 15.3  Across hand at thumb web space 19.6  At base of 2nd digit 6.1  (Blank rows = not tested)  LANDMARK LEFT  eval  10 cm proximal to olecranon process 31.4  Olecranon process 25.9  10 cm proximal to ulnar styloid process 19.3  Just proximal to ulnar styloid process 14.5  Across hand at thumb web space 19.2  At base of 2nd digit 5.8  (Blank rows = not tested)  FUNCTIONAL TESTS:  30 seconds chair stand test  8 repetitions Timed up and go (TUG): 4 position balance test; Unable to hold tandem stance on either side  for more than 3 secs,, trunk sway with all positions  GAIT: Distance walked: 50 feet Assistive device utilized: None Level of assistance: Complete Independence Comments: 1 minor step  off course, no LOB  L-DEX LYMPHEDEMA SCREENING:  The patient was assessed using the L-Dex machine today to produce a lymphedema index baseline score. The patient will be reassessed on a regular basis (typically every 3 months) to obtain new L-Dex scores. If the score is > 6.5 points away from his/her baseline score indicating onset of subclinical lymphedema, it will be recommended to wear a compression garment for 4 weeks, 12 hours per day and then be reassessed. If the score continues to be > 6.5 points from baseline at reassessment, we will initiate lymphedema treatment. Assessing in this manner has a 95% rate of preventing clinically significant lymphedema.    QUICK DASH SURVEY: 68%   TODAY'S TREATMENT:                                                                                                                           DATE:  Evaluation, discussed POC, aquatics etc. Messaged [facility] to see if pt can do aquatic therapy with her porta cath and radiation  PATIENT EDUCATION:  Education details: POC, aquatics Person educated: Patient Education method: Explanation Education comprehension: verbalized understanding  HOME EXERCISE PROGRAM: None given  ASSESSMENT:  CLINICAL IMPRESSION: Patient is a 51 y.o. female who was seen today for physical therapy evaluation and treatment a/p left lumpectomy with Deep axillary SLNB on 08/03/2021. 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 She also has limitations in left shoulder ROM that makes positioning for radiation difficult. She had a poor score on her 30 second sit to stand test and she was unable to maintain tandem stance for more than 2-3 seconds regardless of which leg was forward. She will benefit from skilled therapy to address deficits and return to PLOF   OBJECTIVE IMPAIRMENTS: decreased balance, decreased endurance, decreased ROM, decreased strength, impaired UE  functional use, postural dysfunction, and pain.   ACTIVITY LIMITATIONS: lifting, standing, sleeping, bed mobility, reach over head, and locomotion level  PARTICIPATION LIMITATIONS: driving, occupation, and recreational activities such as walking  PERSONAL FACTORS: 3+ comorbidities: Left breast cancer s/p chemo and presently having radiation  are also affecting patient's functional outcome.   REHAB POTENTIAL: Good  CLINICAL DECISION MAKING: Stable/uncomplicated  EVALUATION COMPLEXITY: Low  GOALS: Goals reviewed with patient? Yes  SHORT TERM GOALS: Target date: 01/18/2022    Pt will be independent in a HEP for LE strength and balance Baseline: Goal status: INITIAL  2.  Pt will restore left shoulder ROM to WNL for improved ease with radiation Baseline:  Goal status: INITIAL  3.  Pt will be able to perform 11 sit to stands in 30 seconds Baseline:  Goal status: INITIAL    LONG TERM GOALS: Target date: 02/08/2022    Pts quick dash will be no greater than 20% Baseline:  Goal status: INITIAL  2.  Pt will be able to maintain tandem stance with either foot forward x 10-15 seconds to demonstrate improved balance Baseline:  Goal status: INITIAL  3.  Pt will be able to perform 15 sit to stands in 30 seconds to decrease fall risk Baseline:  Goal status: INITIAL  4.  Pt will maintain feet together stance 20 seconds without sway Baseline:  Goal status: INITIAL  5.  Pt will have 5/5 strength bilateral LE's for improved safety Baseline:  Goal status: INITIAL  6.  Pt will be independent in aquatic exs so she may join a Y or other gym Baseline:  Goal status: INITIAL  PLAN:  PT FREQUENCY: 3x/week  PT DURATION: 6 weeks  PLANNED INTERVENTIONS: Therapeutic exercises, Therapeutic activity, Neuromuscular re-education, Balance training, Gait training, Patient/Family education, Self Care, Joint mobilization, Orthotic/Fit training, Aquatic Therapy, Dry Needling, scar mobilization,  and Manual therapy  PLAN FOR NEXT SESSION: SOZO next and schedule 3 month SOZO,Shoulder ROM;wand exs flex, scaption, ER, PROM HEP: sit to stand,balance, generalized strength, grip Aquatic therapy if allowed with port and radiation (messaged Mendel Ryder today)   Claris Pong, PT 12/28/2021, 5:25 PM

## 2021-12-28 ENCOUNTER — Other Ambulatory Visit: Payer: Self-pay

## 2021-12-28 ENCOUNTER — Other Ambulatory Visit (HOSPITAL_COMMUNITY): Payer: Self-pay

## 2021-12-28 ENCOUNTER — Ambulatory Visit
Admission: RE | Admit: 2021-12-28 | Discharge: 2021-12-28 | Disposition: A | Discharge status: Home / Self Care | Payer: 59 | Source: Ambulatory Visit | Attending: Radiation Oncology | Admitting: Radiation Oncology

## 2021-12-28 ENCOUNTER — Encounter: Payer: Self-pay | Admitting: *Deleted

## 2021-12-28 ENCOUNTER — Ambulatory Visit: Payer: 59 | Attending: Adult Health

## 2021-12-28 ENCOUNTER — Telehealth: Payer: Self-pay

## 2021-12-28 DIAGNOSIS — M25619 Stiffness of unspecified shoulder, not elsewhere classified: Secondary | ICD-10-CM | POA: Insufficient documentation

## 2021-12-28 DIAGNOSIS — Z95828 Presence of other vascular implants and grafts: Secondary | ICD-10-CM

## 2021-12-28 DIAGNOSIS — T451X5A Adverse effect of antineoplastic and immunosuppressive drugs, initial encounter: Secondary | ICD-10-CM | POA: Insufficient documentation

## 2021-12-28 DIAGNOSIS — R209 Unspecified disturbances of skin sensation: Secondary | ICD-10-CM | POA: Diagnosis not present

## 2021-12-28 DIAGNOSIS — Z170 Estrogen receptor positive status [ER+]: Secondary | ICD-10-CM | POA: Diagnosis not present

## 2021-12-28 DIAGNOSIS — Z510 Encounter for antineoplastic radiation therapy: Secondary | ICD-10-CM | POA: Diagnosis not present

## 2021-12-28 DIAGNOSIS — G620 Drug-induced polyneuropathy: Secondary | ICD-10-CM | POA: Insufficient documentation

## 2021-12-28 DIAGNOSIS — C50412 Malignant neoplasm of upper-outer quadrant of left female breast: Secondary | ICD-10-CM | POA: Diagnosis present

## 2021-12-28 DIAGNOSIS — R2689 Other abnormalities of gait and mobility: Secondary | ICD-10-CM | POA: Diagnosis not present

## 2021-12-28 DIAGNOSIS — M25612 Stiffness of left shoulder, not elsewhere classified: Secondary | ICD-10-CM

## 2021-12-28 DIAGNOSIS — R5383 Other fatigue: Secondary | ICD-10-CM

## 2021-12-28 LAB — RAD ONC ARIA SESSION SUMMARY
Course Elapsed Days: 0
Plan Fractions Treated to Date: 1
Plan Prescribed Dose Per Fraction: 2.67 Gy
Plan Total Fractions Prescribed: 15
Plan Total Prescribed Dose: 40.05 Gy
Reference Point Dosage Given to Date: 2.67 Gy
Reference Point Session Dosage Given: 2.67 Gy
Session Number: 1

## 2021-12-28 MED ORDER — ERGOCALCIFEROL 1.25 MG (50000 UT) PO CAPS
50000.0000 [IU] | ORAL_CAPSULE | ORAL | 0 refills | Status: AC
  Filled 2021-12-28: qty 4, 28d supply, fill #0
  Filled 2022-02-20: qty 4, 28d supply, fill #1
  Filled 2022-03-22: qty 4, 28d supply, fill #2

## 2021-12-28 NOTE — Telephone Encounter (Signed)
Patient informed of recent vitamin D levels. Patient noted to be vitamin D deficient at this time. Patient aware that she will be prescriped vitamin D supplement that she is to take once a week. Patient verbalized an understanding of the information and confirmed preferred pharmacy.  Rx sent.

## 2021-12-29 ENCOUNTER — Other Ambulatory Visit: Payer: Self-pay

## 2021-12-29 ENCOUNTER — Inpatient Hospital Stay: Payer: 59 | Admitting: Dietician

## 2021-12-29 ENCOUNTER — Ambulatory Visit
Admission: RE | Admit: 2021-12-29 | Discharge: 2021-12-29 | Disposition: A | Discharge status: Home / Self Care | Payer: 59 | Source: Ambulatory Visit | Attending: Radiation Oncology | Admitting: Radiation Oncology

## 2021-12-29 DIAGNOSIS — Z510 Encounter for antineoplastic radiation therapy: Secondary | ICD-10-CM | POA: Diagnosis not present

## 2021-12-29 LAB — RAD ONC ARIA SESSION SUMMARY
Course Elapsed Days: 1
Plan Fractions Treated to Date: 2
Plan Prescribed Dose Per Fraction: 2.67 Gy
Plan Total Fractions Prescribed: 15
Plan Total Prescribed Dose: 40.05 Gy
Reference Point Dosage Given to Date: 5.34 Gy
Reference Point Session Dosage Given: 2.67 Gy
Session Number: 2

## 2021-12-29 NOTE — Progress Notes (Signed)
Patient attended Nutrition 101 class 12/29/21

## 2021-12-30 ENCOUNTER — Ambulatory Visit
Admission: RE | Admit: 2021-12-30 | Discharge: 2021-12-30 | Disposition: A | Discharge status: Home / Self Care | Payer: 59 | Source: Ambulatory Visit | Attending: Radiation Oncology | Admitting: Radiation Oncology

## 2021-12-30 ENCOUNTER — Other Ambulatory Visit: Payer: Self-pay

## 2021-12-30 DIAGNOSIS — Z510 Encounter for antineoplastic radiation therapy: Secondary | ICD-10-CM | POA: Diagnosis not present

## 2021-12-30 LAB — RAD ONC ARIA SESSION SUMMARY
Course Elapsed Days: 2
Plan Fractions Treated to Date: 3
Plan Prescribed Dose Per Fraction: 2.67 Gy
Plan Total Fractions Prescribed: 15
Plan Total Prescribed Dose: 40.05 Gy
Reference Point Dosage Given to Date: 8.01 Gy
Reference Point Session Dosage Given: 2.67 Gy
Session Number: 3

## 2022-01-02 ENCOUNTER — Ambulatory Visit
Admission: RE | Admit: 2022-01-02 | Discharge: 2022-01-02 | Disposition: A | Discharge status: Home / Self Care | Payer: 59 | Source: Ambulatory Visit | Attending: Radiation Oncology | Admitting: Radiation Oncology

## 2022-01-02 ENCOUNTER — Other Ambulatory Visit: Payer: Self-pay

## 2022-01-02 DIAGNOSIS — Z510 Encounter for antineoplastic radiation therapy: Secondary | ICD-10-CM | POA: Diagnosis not present

## 2022-01-02 DIAGNOSIS — C50412 Malignant neoplasm of upper-outer quadrant of left female breast: Secondary | ICD-10-CM

## 2022-01-02 DIAGNOSIS — Z170 Estrogen receptor positive status [ER+]: Secondary | ICD-10-CM

## 2022-01-02 LAB — RAD ONC ARIA SESSION SUMMARY
Course Elapsed Days: 5
Plan Fractions Treated to Date: 4
Plan Prescribed Dose Per Fraction: 2.67 Gy
Plan Total Fractions Prescribed: 15
Plan Total Prescribed Dose: 40.05 Gy
Reference Point Dosage Given to Date: 10.68 Gy
Reference Point Session Dosage Given: 2.67 Gy
Session Number: 4

## 2022-01-02 MED ORDER — ALRA NON-METALLIC DEODORANT (RAD-ONC)
1.0000 | Freq: Once | TOPICAL | Status: AC
  Administered 2022-01-02: 1 via TOPICAL

## 2022-01-02 MED ORDER — RADIAPLEXRX EX GEL: Freq: Once | CUTANEOUS | Status: AC

## 2022-01-02 NOTE — Progress Notes (Signed)
Pt here for patient teaching on 01-02-22 for breast cancer.   Pt given Radiation and You booklet, skin care instructions, Alra deodorant, and Radiaplex gel.    Reviewed areas of pertinence such as fatigue, hair loss in treatment field, skin changes, breast tenderness, breast swelling, and taste changes .   Pt able to give teach back of to pat skin, use unscented/gentle soap, and drink plenty of water,apply Radiaplex bid, avoid applying anything to skin within 4 hours of treatment, avoid wearing an under wire bra, and to use an electric razor if they must shave.   Pt demonstrated understanding of information given and will contact nursing with any questions or concerns.    [email or web address]

## 2022-01-03 ENCOUNTER — Ambulatory Visit
Admission: RE | Admit: 2022-01-03 | Discharge: 2022-01-03 | Disposition: A | Discharge status: Home / Self Care | Payer: 59 | Source: Ambulatory Visit | Attending: Radiation Oncology | Admitting: Radiation Oncology

## 2022-01-03 ENCOUNTER — Telehealth: Payer: Self-pay | Admitting: *Deleted

## 2022-01-03 ENCOUNTER — Other Ambulatory Visit: Payer: Self-pay | Admitting: Emergency Medicine

## 2022-01-03 ENCOUNTER — Ambulatory Visit (HOSPITAL_COMMUNITY)
Admission: RE | Admit: 2022-01-03 | Discharge: 2022-01-03 | Disposition: A | Discharge status: Home / Self Care | Payer: 59 | Source: Ambulatory Visit | Attending: Radiation Oncology | Admitting: Radiation Oncology

## 2022-01-03 ENCOUNTER — Other Ambulatory Visit: Payer: Self-pay

## 2022-01-03 DIAGNOSIS — Z510 Encounter for antineoplastic radiation therapy: Secondary | ICD-10-CM | POA: Diagnosis not present

## 2022-01-03 DIAGNOSIS — Z170 Estrogen receptor positive status [ER+]: Secondary | ICD-10-CM | POA: Diagnosis not present

## 2022-01-03 DIAGNOSIS — C50412 Malignant neoplasm of upper-outer quadrant of left female breast: Secondary | ICD-10-CM | POA: Insufficient documentation

## 2022-01-03 LAB — RAD ONC ARIA SESSION SUMMARY
Course Elapsed Days: 6
Plan Fractions Treated to Date: 5
Plan Prescribed Dose Per Fraction: 2.67 Gy
Plan Total Fractions Prescribed: 15
Plan Total Prescribed Dose: 40.05 Gy
Reference Point Dosage Given to Date: 13.35 Gy
Reference Point Session Dosage Given: 2.67 Gy
Session Number: 5

## 2022-01-03 NOTE — Telephone Encounter (Signed)
Called patient to inform of doppler for 01-03-22- arrival time- 2:50 pm @ [facility], no restrictions to test, spoke with patient and she is aware of this appt.

## 2022-01-04 ENCOUNTER — Ambulatory Visit
Admission: RE | Admit: 2022-01-04 | Discharge: 2022-01-04 | Disposition: A | Discharge status: Home / Self Care | Payer: 59 | Source: Ambulatory Visit | Attending: Radiation Oncology | Admitting: Radiation Oncology

## 2022-01-04 ENCOUNTER — Other Ambulatory Visit (HOSPITAL_COMMUNITY): Payer: Self-pay

## 2022-01-04 ENCOUNTER — Other Ambulatory Visit: Payer: Self-pay

## 2022-01-04 DIAGNOSIS — C50412 Malignant neoplasm of upper-outer quadrant of left female breast: Secondary | ICD-10-CM | POA: Insufficient documentation

## 2022-01-04 DIAGNOSIS — Z7951 Long term (current) use of inhaled steroids: Secondary | ICD-10-CM | POA: Diagnosis not present

## 2022-01-04 DIAGNOSIS — Z803 Family history of malignant neoplasm of breast: Secondary | ICD-10-CM | POA: Diagnosis not present

## 2022-01-04 DIAGNOSIS — J45909 Unspecified asthma, uncomplicated: Secondary | ICD-10-CM | POA: Diagnosis not present

## 2022-01-04 DIAGNOSIS — Z510 Encounter for antineoplastic radiation therapy: Secondary | ICD-10-CM | POA: Diagnosis present

## 2022-01-04 DIAGNOSIS — Z9221 Personal history of antineoplastic chemotherapy: Secondary | ICD-10-CM | POA: Diagnosis not present

## 2022-01-04 DIAGNOSIS — Z8042 Family history of malignant neoplasm of prostate: Secondary | ICD-10-CM | POA: Diagnosis not present

## 2022-01-04 DIAGNOSIS — Z79899 Other long term (current) drug therapy: Secondary | ICD-10-CM | POA: Diagnosis not present

## 2022-01-04 DIAGNOSIS — Z170 Estrogen receptor positive status [ER+]: Secondary | ICD-10-CM | POA: Insufficient documentation

## 2022-01-04 DIAGNOSIS — Z7952 Long term (current) use of systemic steroids: Secondary | ICD-10-CM | POA: Diagnosis not present

## 2022-01-04 LAB — RAD ONC ARIA SESSION SUMMARY
Course Elapsed Days: 7
Plan Fractions Treated to Date: 6
Plan Prescribed Dose Per Fraction: 2.67 Gy
Plan Total Fractions Prescribed: 15
Plan Total Prescribed Dose: 40.05 Gy
Reference Point Dosage Given to Date: 16.02 Gy
Reference Point Session Dosage Given: 2.67 Gy
Session Number: 6

## 2022-01-05 ENCOUNTER — Other Ambulatory Visit: Payer: Self-pay

## 2022-01-05 ENCOUNTER — Ambulatory Visit
Admission: RE | Admit: 2022-01-05 | Discharge: 2022-01-05 | Disposition: A | Discharge status: Home / Self Care | Payer: 59 | Source: Ambulatory Visit | Attending: Radiation Oncology | Admitting: Radiation Oncology

## 2022-01-05 DIAGNOSIS — Z510 Encounter for antineoplastic radiation therapy: Secondary | ICD-10-CM | POA: Diagnosis not present

## 2022-01-05 LAB — RAD ONC ARIA SESSION SUMMARY
Course Elapsed Days: 8
Plan Fractions Treated to Date: 7
Plan Prescribed Dose Per Fraction: 2.67 Gy
Plan Total Fractions Prescribed: 15
Plan Total Prescribed Dose: 40.05 Gy
Reference Point Dosage Given to Date: 18.69 Gy
Reference Point Session Dosage Given: 2.67 Gy
Session Number: 7

## 2022-01-06 ENCOUNTER — Other Ambulatory Visit: Payer: Self-pay

## 2022-01-06 ENCOUNTER — Ambulatory Visit
Admission: RE | Admit: 2022-01-06 | Discharge: 2022-01-06 | Disposition: A | Discharge status: Home / Self Care | Payer: 59 | Source: Ambulatory Visit | Attending: Radiation Oncology | Admitting: Radiation Oncology

## 2022-01-06 DIAGNOSIS — Z510 Encounter for antineoplastic radiation therapy: Secondary | ICD-10-CM | POA: Diagnosis not present

## 2022-01-06 LAB — RAD ONC ARIA SESSION SUMMARY
Course Elapsed Days: 9
Plan Fractions Treated to Date: 8
Plan Prescribed Dose Per Fraction: 2.67 Gy
Plan Total Fractions Prescribed: 15
Plan Total Prescribed Dose: 40.05 Gy
Reference Point Dosage Given to Date: 21.36 Gy
Reference Point Session Dosage Given: 2.67 Gy
Session Number: 8

## 2022-01-09 ENCOUNTER — Other Ambulatory Visit: Payer: Self-pay

## 2022-01-09 ENCOUNTER — Ambulatory Visit
Admission: RE | Admit: 2022-01-09 | Discharge: 2022-01-09 | Disposition: A | Discharge status: Home / Self Care | Payer: 59 | Source: Ambulatory Visit | Attending: Radiation Oncology | Admitting: Radiation Oncology

## 2022-01-09 ENCOUNTER — Encounter (INDEPENDENT_AMBULATORY_CARE_PROVIDER_SITE_OTHER): Payer: Self-pay

## 2022-01-09 ENCOUNTER — Ambulatory Visit: Payer: 59

## 2022-01-09 ENCOUNTER — Ambulatory Visit: Payer: 59 | Admitting: Radiation Oncology

## 2022-01-09 DIAGNOSIS — Z510 Encounter for antineoplastic radiation therapy: Secondary | ICD-10-CM | POA: Diagnosis not present

## 2022-01-09 DIAGNOSIS — C50412 Malignant neoplasm of upper-outer quadrant of left female breast: Secondary | ICD-10-CM

## 2022-01-09 LAB — RAD ONC ARIA SESSION SUMMARY
Course Elapsed Days: 12
Plan Fractions Treated to Date: 9
Plan Prescribed Dose Per Fraction: 2.67 Gy
Plan Total Fractions Prescribed: 15
Plan Total Prescribed Dose: 40.05 Gy
Reference Point Dosage Given to Date: 24.03 Gy
Reference Point Session Dosage Given: 2.67 Gy
Session Number: 9

## 2022-01-09 MED ORDER — RADIAPLEXRX EX GEL: Freq: Once | CUTANEOUS | Status: AC

## 2022-01-10 ENCOUNTER — Other Ambulatory Visit: Payer: Self-pay

## 2022-01-10 ENCOUNTER — Ambulatory Visit
Admission: RE | Admit: 2022-01-10 | Discharge: 2022-01-10 | Disposition: A | Discharge status: Home / Self Care | Payer: 59 | Source: Ambulatory Visit | Attending: Radiation Oncology | Admitting: Radiation Oncology

## 2022-01-10 DIAGNOSIS — Z510 Encounter for antineoplastic radiation therapy: Secondary | ICD-10-CM | POA: Diagnosis not present

## 2022-01-10 LAB — RAD ONC ARIA SESSION SUMMARY
Course Elapsed Days: 13
Plan Fractions Treated to Date: 10
Plan Prescribed Dose Per Fraction: 2.67 Gy
Plan Total Fractions Prescribed: 15
Plan Total Prescribed Dose: 40.05 Gy
Reference Point Dosage Given to Date: 26.7 Gy
Reference Point Session Dosage Given: 2.67 Gy
Session Number: 10

## 2022-01-11 ENCOUNTER — Other Ambulatory Visit: Payer: Self-pay

## 2022-01-11 ENCOUNTER — Ambulatory Visit
Admission: RE | Admit: 2022-01-11 | Discharge: 2022-01-11 | Disposition: A | Discharge status: Home / Self Care | Payer: 59 | Source: Ambulatory Visit | Attending: Radiation Oncology | Admitting: Radiation Oncology

## 2022-01-11 ENCOUNTER — Ambulatory Visit: Payer: 59 | Attending: Adult Health

## 2022-01-11 DIAGNOSIS — M25612 Stiffness of left shoulder, not elsewhere classified: Secondary | ICD-10-CM | POA: Insufficient documentation

## 2022-01-11 DIAGNOSIS — Z170 Estrogen receptor positive status [ER+]: Secondary | ICD-10-CM | POA: Insufficient documentation

## 2022-01-11 DIAGNOSIS — R600 Localized edema: Secondary | ICD-10-CM | POA: Insufficient documentation

## 2022-01-11 DIAGNOSIS — C50412 Malignant neoplasm of upper-outer quadrant of left female breast: Secondary | ICD-10-CM | POA: Diagnosis not present

## 2022-01-11 DIAGNOSIS — R293 Abnormal posture: Secondary | ICD-10-CM | POA: Insufficient documentation

## 2022-01-11 DIAGNOSIS — J45909 Unspecified asthma, uncomplicated: Secondary | ICD-10-CM | POA: Insufficient documentation

## 2022-01-11 DIAGNOSIS — R2689 Other abnormalities of gait and mobility: Secondary | ICD-10-CM | POA: Insufficient documentation

## 2022-01-11 DIAGNOSIS — T451X5A Adverse effect of antineoplastic and immunosuppressive drugs, initial encounter: Secondary | ICD-10-CM | POA: Diagnosis present

## 2022-01-11 DIAGNOSIS — Z7952 Long term (current) use of systemic steroids: Secondary | ICD-10-CM | POA: Insufficient documentation

## 2022-01-11 DIAGNOSIS — Z803 Family history of malignant neoplasm of breast: Secondary | ICD-10-CM | POA: Insufficient documentation

## 2022-01-11 DIAGNOSIS — Z7951 Long term (current) use of inhaled steroids: Secondary | ICD-10-CM | POA: Insufficient documentation

## 2022-01-11 DIAGNOSIS — Z510 Encounter for antineoplastic radiation therapy: Secondary | ICD-10-CM | POA: Insufficient documentation

## 2022-01-11 DIAGNOSIS — R209 Unspecified disturbances of skin sensation: Secondary | ICD-10-CM | POA: Diagnosis present

## 2022-01-11 DIAGNOSIS — Z8042 Family history of malignant neoplasm of prostate: Secondary | ICD-10-CM | POA: Insufficient documentation

## 2022-01-11 DIAGNOSIS — Z79899 Other long term (current) drug therapy: Secondary | ICD-10-CM | POA: Insufficient documentation

## 2022-01-11 DIAGNOSIS — Z9221 Personal history of antineoplastic chemotherapy: Secondary | ICD-10-CM | POA: Insufficient documentation

## 2022-01-11 DIAGNOSIS — G620 Drug-induced polyneuropathy: Secondary | ICD-10-CM | POA: Diagnosis present

## 2022-01-11 LAB — RAD ONC ARIA SESSION SUMMARY
Course Elapsed Days: 14
Plan Fractions Treated to Date: 11
Plan Prescribed Dose Per Fraction: 2.67 Gy
Plan Total Fractions Prescribed: 15
Plan Total Prescribed Dose: 40.05 Gy
Reference Point Dosage Given to Date: 29.37 Gy
Reference Point Session Dosage Given: 2.67 Gy
Session Number: 11

## 2022-01-11 NOTE — Therapy (Signed)
OUTPATIENT PHYSICAL THERAPY ONCOLOGY EVALUATION  Patient Name: Darlene Peters MRN: [id number] DOB:Apr 12, 1970, 51 y.o., female Today's Date: 01/11/2022   PT End of Session - 01/11/22 0902     Visit Number 2    Number of Visits 18    Date for PT Re-Evaluation 02/08/22    PT Start Time 0903    PT Stop Time 0955    PT Time Calculation (min) 52 min    Activity Tolerance Patient tolerated treatment well    Behavior During Therapy [facility] for tasks assessed/performed             Past Medical History:  Diagnosis Date   Asthma    "worse when lying down"   Breast cancer ([place])    Family history of breast cancer 07/19/2021   Family history of prostate cancer 07/19/2021   GERD (gastroesophageal reflux disease)    Psoriasis    Urticaria    Past Surgical History:  Procedure Laterality Date   ABDOMINAL HYSTERECTOMY     partial   BREAST BIOPSY Left 05/26/2021   BREAST LUMPECTOMY WITH RADIOACTIVE SEED AND SENTINEL LYMPH NODE BIOPSY Left 08/03/2021   Procedure: LEFT BREAST LUMPECTOMY WITH RADIOACTIVE SEED AND SENTINEL LYMPH NODE BIOPSY;  Surgeon: Jovita Kussmaul, MD;  Location: [place];  Service: General;  Laterality: Left;   IR IMAGING GUIDED PORT INSERTION  09/26/2021   RADIOACTIVE SEED GUIDED AXILLARY SENTINEL LYMPH NODE Left 08/03/2021   Procedure: RADIOACTIVE SEED GUIDED LEFT AXILLARY SENTINEL LYMPH NODE DISSECTION;  Surgeon: Jovita Kussmaul, MD;  Location: [place];  Service: General;  Laterality: Left;   TUMOR REMOVAL Left 08/2021   breast   Patient Active Problem List   Diagnosis Date Noted   Port-A-Cath in place 10/20/2021   Bacteremia 10/03/2021   Syncope 10/03/2021   Constipation 10/03/2021   Genetic testing 07/27/2021   Family history of breast cancer 07/19/2021   Family history of prostate cancer 07/19/2021   Anxiety 06/22/2021   Difficulty sleeping 06/22/2021   Major depressive disorder, single episode, unspecified 06/22/2021    Malignant neoplasm of upper-outer quadrant of left breast in female, estrogen receptor positive ([place]) 06/14/2021   Other fatigue 11/05/2019   Psoriasis 10/14/2019   Gastroesophageal reflux disease without esophagitis 05/01/2019   Intermittent asthma 07/05/2018   Low back pain 01/11/2017    PCP: Maebelle Munroe, DO  REFERRING PROVIDER: Wilber Bihari NP  REFERRING DIAG: s/p Left Breast Cancer  THERAPY DIAG:  Malignant neoplasm of upper-outer quadrant of left breast in female, estrogen receptor positive (HCC)  Stiffness of left shoulder, not elsewhere classified  Paclitaxel induced neuropathy (HCC)  Loss of balance  Disturbance of skin sensation  Localized edema  Abnormal posture  ONSET DATE: 06/14/2021  Rationale for Evaluation and Treatment Rehabilitation  SUBJECTIVE:  SUBJECTIVE STATEMENT: I am in my second week of radiation. Skin is doing OK. I still have muscle in gluts, thighs, legs, arms.  PERTINENT HISTORY:  Patient was diagnosed on 05/31/2021 with left Breast Cancer. It measures 2.6 cm and is located in the upper-outer quadrant. It is ER+,PR-, HER 2 - with a Ki67 of <5%.  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 0/6 LN. She had adjuvant chemotherapy with Taxotere/Cytoxan every 21 Days x 4 cycles ending on 12/01/2021, and adjuvant radiation starting on 12/28/2021. She will be having 4 weeks or radiation.   PAIN:  Are you having pain? Yes NPRS scale: 4/10 Pain location: bilateral hands, upperback/ Left shoulder blade Pain orientation: Left  PAIN TYPE: dull Pain description: constant  Aggravating factors: lying down, rising from sitting, walking Relieving factors: massager on legs, shoulder blade,  stretching, aquatics  PRECAUTIONS: Left UE lymphedema risk, neuropathy, psoriasis  WEIGHT BEARING RESTRICTIONS: No  FALLS:  Has patient fallen in last 6 months? Yes. Number of falls 1  LIVING ENVIRONMENT: Lives with: lives with their spouse Lives in: House/apartment Stairs: Yes; External: 3 steps; on left going up Has following equipment at home: None  OCCUPATION: Not presently working  LEISURE: walking,  reading  HAND DOMINANCE: right   PRIOR LEVEL OF FUNCTION: Independent  PATIENT GOALS: Get stronger, less pain,more stamina and balance, more energy   OBJECTIVE:  COGNITION: Overall cognitive status: Within functional limits for tasks assessed   PALPATION: NT  OBSERVATIONS / OTHER ASSESSMENTS: slumped sitting posture, with gait normal gait over 50 feet with one step off center before returning  SENSATION: Light touch: Deficits back of arm  POSTURE: forward head, rounded shoulders  UPPER EXTREMITY AROM/PROM:  A/PROM RIGHT   eval   Shoulder extension 64  Shoulder flexion 165  Shoulder abduction 180  Shoulder internal rotation 75  Shoulder external rotation 110    (Blank rows = not tested)  A/PROM LEFT   eval  Shoulder extension 65  Shoulder flexion 150  Shoulder abduction 170  Shoulder internal rotation 65  Shoulder external rotation 105    (Blank rows = not tested)  CERVICAL AROM: All within functional limits:     UPPER EXTREMITY STRENGTH: NT   LOWER EXTREMITY AROM/PROM:  A/PROM Right eval  Hip flexion 4  Hip extension   Hip abduction 5  Hip adduction 4  Hip internal rotation 4-  Hip external rotation 3+  Knee flexion 5  Knee extension 4  Ankle dorsiflexion 5  Ankle plantarflexion 5  Ankle inversion   Ankle eversion    (Blank rows = not tested)  A/PROM LEFT eval  Hip flexion 4  Hip extension   Hip abduction 5 seated  Hip adduction 4+ seated  Hip internal rotation 4  Hip external rotation 4  Knee flexion 5  Knee extension  4+  Ankle dorsiflexion 5  Ankle plantarflexion 5  Ankle inversion   Ankle eversion    (Blank rows = not tested)  LOWER EXTREMITY MMT: see above  LYMPHEDEMA ASSESSMENTS:   SURGERY TYPE/DATE: 08/03/2021 Left Lumpectomy with deep axillary SLNB  NUMBER OF LYMPH NODES REMOVED: 0/6  CHEMOTHERAPY: 4 cycles, ended 12/01/2021  RADIATION:to start 12/28/2021  HORMONE TREATMENT: will start after radiation  INFECTIONS: Bacterial infection in blood in July 2023  LYMPHEDEMA ASSESSMENTS:   LANDMARK RIGHT  eval  10 cm proximal to olecranon process 32.3  Olecranon process 26.6  10  cm proximal to ulnar styloid process 20.8  Just proximal to ulnar styloid process 15.3  Across hand at thumb web space 19.6  At base of 2nd digit 6.1  (Blank rows = not tested)  LANDMARK LEFT  eval  10 cm proximal to olecranon process 31.4  Olecranon process 25.9  10 cm proximal to ulnar styloid process 19.3  Just proximal to ulnar styloid process 14.5  Across hand at thumb web space 19.2  At base of 2nd digit 5.8  (Blank rows = not tested)  FUNCTIONAL TESTS:  30 seconds chair stand test  8 repetitions Timed up and go (TUG): 4 position balance test; Unable to hold tandem stance on either side  for more than 3 secs,, trunk sway with all positions  GAIT: Distance walked: 50 feet Assistive device utilized: None Level of assistance: Complete [place] Comments: 1 minor step off course, no LOB  L-DEX LYMPHEDEMA SCREENING:  The patient was assessed using the L-Dex machine today to produce a lymphedema index baseline score. The patient will be reassessed on a regular basis (typically every 3 months) to obtain new L-Dex scores. If the score is > 6.5 points away from his/her baseline score indicating onset of subclinical lymphedema, it will be recommended to wear a compression garment for 4 weeks, 12 hours per day and then be reassessed. If the score continues to be > 6.5 points from baseline at  reassessment, we will initiate lymphedema treatment. Assessing in this manner has a 95% rate of preventing clinically significant lymphedema.   L-DEX FLOWSHEETS - 01/11/22 0900       L-DEX LYMPHEDEMA SCREENING   Measurement Type Unilateral    L-DEX MEASUREMENT EXTREMITY Upper Extremity    POSITION  Standing    DOMINANT SIDE Right    At Risk Side Left    BASELINE SCORE (UNILATERAL) 0.5    L-DEX SCORE (UNILATERAL) 0.9    VALUE CHANGE (UNILAT) 0.4             QUICK DASH SURVEY: 68%   TODAY'S TREATMENT:                                                                                                                           DATE:   01/11/2022 Performed pt sozo screen and pt is well within the green range Pulleys x 2 min for shoulder flexion and abduction with occasional VC's for technique to assist shoulder ROM Nustep seat 11, UE 10 lev. 2 x 5 Sit to stand 2 x 5 from mat table. In parallel bars: heel raises x 15 no HH, marching x 10 ea no HH, Tandem stance each side x3  for balance Standing gastroc stretch at wall 3 x 20 sec ea, seated HS stretch x3 20 sec SLS x 5 ea Updated HEP  12/28/2021 Evaluation, discussed POC, aquatics etc. Messaged Lindsey to see if pt can do aquatic therapy with her porta cath and radiation  PATIENT EDUCATION:  Education details: POC,  aquatics,Access Code: [email or web address] URL: [email or web address] Date: 01/11/2022 Prepared by: Cheral Almas  Exercises - Sit to Stand Without Arm Support  - 2 x daily - 7 x weekly - 2 sets - 5 reps - Standing Heel Raise  - 2 x daily - 7 x weekly - 1 sets - 10 reps - Single Leg Stance  - 1 x daily - 7 x weekly - 1 sets - 5 reps - 10 hold - Standing Hip Flexion March  - 1 x daily - 7 x weekly - 3 sets - 10 reps - Gastroc Stretch on Wall  - 2 x daily - 7 x weekly - 1 sets - 3 reps - 20 hold - Seated Hamstring Stretch  - 1 x daily - 7 x weekly - 1 sets - 3 reps - 20 hold Person educated: Patient Education  method: Explanation Education comprehension: verbalized understanding,demonstrated  HOME EXERCISE PROGRAM: Sit to Stand Without Arm Support  - 2 x daily - 7 x weekly - 2 sets - 5 reps - Standing Heel Raise  - 2 x daily - 7 x weekly - 1 sets - 10 reps - Single Leg Stance  - 1 x daily - 7 x weekly - 1 sets - 5 reps - 10 hold - Standing Hip Flexion March  - 1 x daily - 7 x weekly - 3 sets - 10 reps - Gastroc Stretch on Wall  - 2 x daily - 7 x weekly - 1 sets - 3 reps - 20 hold - Seated Hamstring Stretch  - 1 x daily - 7 x weekly - 1 sets - 3 reps - 20 ho  ASSESSMENT:  CLINICAL IMPRESSION: Pts SOZO screen was in green range. She did well with exercises performed but did experience LOB with standing tandem and SLS activities.She is still complaining of bone pain and neuropathy from chemo. Suggested she got for some walks on these beautiful das.  OBJECTIVE IMPAIRMENTS: decreased balance, decreased endurance, decreased ROM, decreased strength, impaired UE functional use, postural dysfunction, and pain.   ACTIVITY LIMITATIONS: lifting, standing, sleeping, bed mobility, reach over head, and locomotion level  PARTICIPATION LIMITATIONS: driving, occupation, and recreational activities such as walking  PERSONAL FACTORS: 3+ comorbidities: Left breast cancer s/p chemo and presently having radiation  are also affecting patient's functional outcome.   REHAB POTENTIAL: Good  CLINICAL DECISION MAKING: Stable/uncomplicated  EVALUATION COMPLEXITY: Low  GOALS: Goals reviewed with patient? Yes  SHORT TERM GOALS: Target date: 02/01/2022    Pt will be independent in a HEP for LE strength and balance Baseline: Goal status: INITIAL  2.  Pt will restore left shoulder ROM to WNL for improved ease with radiation Baseline:  Goal status: INITIAL  3.  Pt will be able to perform 11 sit to stands in 30 seconds Baseline:  Goal status: INITIAL    LONG TERM GOALS: Target date: 02/22/2022    Pts quick  dash will be no greater than 20% Baseline:  Goal status: INITIAL  2.  Pt will be able to maintain tandem stance with either foot forward x 10-15 seconds to demonstrate improved balance Baseline:  Goal status: INITIAL  3.  Pt will be able to perform 15 sit to stands in 30 seconds to decrease fall risk Baseline:  Goal status: INITIAL  4.  Pt will maintain feet together stance 20 seconds without sway Baseline:  Goal status: INITIAL  5.  Pt will have 5/5 strength bilateral LE's for improved safety Baseline:  Goal status:  INITIAL  6.  Pt will be independent in aquatic exs so she may join a Y or other gym Baseline:  Goal status: INITIAL  PLAN:  PT FREQUENCY: 3x/week  PT DURATION: 6 weeks  PLANNED INTERVENTIONS: Therapeutic exercises, Therapeutic activity, Neuromuscular re-education, Balance training, Gait training, Patient/Family education, Self Care, Joint mobilization, Orthotic/Fit training, Aquatic Therapy, Dry Needling, scar mobilization, and Manual therapy  PLAN FOR NEXT SESSION: ,Shoulder ROM;wand exs flex, scaption, ER, PROM HEP: sit to stand,balance, generalized strength, grip Aquatic therapy if allowed with port and radiation (messaged [facility] today) OK with Mendel Ryder per inbox   Claris Pong, PT 01/11/2022, 9:59 AM

## 2022-01-12 ENCOUNTER — Ambulatory Visit
Admission: RE | Admit: 2022-01-12 | Discharge: 2022-01-12 | Disposition: A | Discharge status: Home / Self Care | Payer: 59 | Source: Ambulatory Visit | Attending: Radiation Oncology | Admitting: Radiation Oncology

## 2022-01-12 ENCOUNTER — Other Ambulatory Visit: Payer: Self-pay

## 2022-01-12 ENCOUNTER — Ambulatory Visit: Payer: 59

## 2022-01-12 ENCOUNTER — Other Ambulatory Visit (HOSPITAL_COMMUNITY): Payer: Self-pay

## 2022-01-12 DIAGNOSIS — Z510 Encounter for antineoplastic radiation therapy: Secondary | ICD-10-CM | POA: Diagnosis not present

## 2022-01-12 LAB — RAD ONC ARIA SESSION SUMMARY
Course Elapsed Days: 15
Plan Fractions Treated to Date: 12
Plan Prescribed Dose Per Fraction: 2.67 Gy
Plan Total Fractions Prescribed: 15
Plan Total Prescribed Dose: 40.05 Gy
Reference Point Dosage Given to Date: 32.04 Gy
Reference Point Session Dosage Given: 2.67 Gy
Session Number: 12

## 2022-01-12 MED ORDER — TRIAMCINOLONE ACETONIDE 0.1 % EX CREA
TOPICAL_CREAM | CUTANEOUS | 0 refills | Status: AC
  Filled 2022-01-12: qty 60, 30d supply, fill #0

## 2022-01-13 ENCOUNTER — Ambulatory Visit
Admission: RE | Admit: 2022-01-13 | Discharge: 2022-01-13 | Disposition: A | Discharge status: Home / Self Care | Payer: 59 | Source: Ambulatory Visit | Attending: Radiation Oncology | Admitting: Radiation Oncology

## 2022-01-13 ENCOUNTER — Other Ambulatory Visit: Payer: Self-pay

## 2022-01-13 DIAGNOSIS — Z510 Encounter for antineoplastic radiation therapy: Secondary | ICD-10-CM | POA: Diagnosis not present

## 2022-01-13 LAB — RAD ONC ARIA SESSION SUMMARY
Course Elapsed Days: 16
Plan Fractions Treated to Date: 13
Plan Prescribed Dose Per Fraction: 2.67 Gy
Plan Total Fractions Prescribed: 15
Plan Total Prescribed Dose: 40.05 Gy
Reference Point Dosage Given to Date: 34.71 Gy
Reference Point Session Dosage Given: 2.67 Gy
Session Number: 13

## 2022-01-16 ENCOUNTER — Ambulatory Visit
Admission: RE | Admit: 2022-01-16 | Discharge: 2022-01-16 | Disposition: A | Discharge status: Home / Self Care | Payer: 59 | Source: Ambulatory Visit | Attending: Radiation Oncology | Admitting: Radiation Oncology

## 2022-01-16 ENCOUNTER — Other Ambulatory Visit: Payer: Self-pay

## 2022-01-16 ENCOUNTER — Ambulatory Visit: Payer: 59

## 2022-01-16 DIAGNOSIS — Z510 Encounter for antineoplastic radiation therapy: Secondary | ICD-10-CM | POA: Diagnosis not present

## 2022-01-16 LAB — RAD ONC ARIA SESSION SUMMARY
Course Elapsed Days: 19
Plan Fractions Treated to Date: 14
Plan Prescribed Dose Per Fraction: 2.67 Gy
Plan Total Fractions Prescribed: 15
Plan Total Prescribed Dose: 40.05 Gy
Reference Point Dosage Given to Date: 37.38 Gy
Reference Point Session Dosage Given: 2.67 Gy
Session Number: 14

## 2022-01-16 NOTE — Progress Notes (Signed)
H&P  Chief Complaint: Bladder symptoms  History of Present Illness: Darlene Peters is a 51 y.o. year old female presenting for urinary frequency, bladder spasms, urgency and urgency incontinence.  Also fairly new found nocturia x3-4.  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  She completed chemotherapy in September and is now getting external beam radiotherapy.  She does wear 1-2 pads a day because of her urgency and occasional urgency incontinence.  She denies stress incontinence.  Past Medical History:  Diagnosis Date   Asthma    "worse when lying down"   Breast cancer ([place])    Family history of breast cancer 07/19/2021   Family history of prostate cancer 07/19/2021   GERD (gastroesophageal reflux disease)    Psoriasis    Urticaria     Past Surgical History:  Procedure Laterality Date   ABDOMINAL HYSTERECTOMY     partial   BREAST BIOPSY Left 05/26/2021   BREAST LUMPECTOMY WITH RADIOACTIVE SEED AND SENTINEL LYMPH NODE BIOPSY Left 08/03/2021   Procedure: LEFT BREAST LUMPECTOMY WITH RADIOACTIVE SEED AND SENTINEL LYMPH NODE BIOPSY;  Surgeon: Jovita Kussmaul, MD;  Location: [place];  Service: General;  Laterality: Left;   IR IMAGING GUIDED PORT INSERTION  09/26/2021   RADIOACTIVE SEED GUIDED AXILLARY SENTINEL LYMPH NODE Left 08/03/2021   Procedure: RADIOACTIVE SEED GUIDED LEFT AXILLARY SENTINEL LYMPH NODE DISSECTION;  Surgeon: Jovita Kussmaul, MD;  Location: [place];  Service: General;  Laterality: Left;   TUMOR REMOVAL Left 08/2021   breast    Home Medications:  (Not in a hospital admission)   Allergies:  Allergies  Allergen Reactions   Shellfish Allergy Rash   Fish Allergy    Gluten Meal    Latex    Other     Pt states no blood transfusion. Is a Jehovah Witness   Docetaxel Other (See Comments)    Epigastric pain    Family History  Problem Relation Age of Onset   Allergic rhinitis Mother     Allergic rhinitis Sister    Allergic rhinitis Sister    Breast cancer Maternal Aunt 45   Prostate cancer Maternal Uncle        mets; dx after 50   Prostate cancer Paternal Uncle    Leukemia Paternal Uncle    Prostate cancer Paternal Uncle 29   Gastric cancer Paternal Uncle    Skin cancer Maternal Grandmother 39   Breast cancer Cousin 9       maternal cousin   Cervical cancer Cousin    Breast cancer Cousin        dx 36s; maternal female cousin   Breast cancer Cousin        paternal female cousin x2    Social History:  reports that she has never smoked. She has never been exposed to tobacco smoke. She has never used smokeless tobacco. She reports that she does not drink alcohol and does not use drugs.  ROS: A complete review of systems was performed.  All systems are negative except for pertinent findings as noted.  Physical Exam:  Vital signs in last 24 hours: '@VSRANGES'$ @ General:  Alert and oriented, No acute distress HEENT: Normocephalic, atraumatic Neck: No JVD or lymphadenopathy Cardiovascular: Regular rate  Extremities: No edema Neurologic: Grossly intact  I have reviewed notes from previous physicians  I have reviewed urinalysis results  I have independently reviewed prior imaging   Impression/Assessment:  Overactive bladder  symptoms.  She empties out well  Plan:  1.  She was given an overactive bladder guide sheet  2.  I will have her come back in about 4 weeks to recheck symptoms.  If significant symptoms still, consider trial of overactive bladder medicines  Lillette Boxer Debe Anfinson 01/16/2022, 9:49 AM  Lillette Boxer. Felma Pfefferle MD

## 2022-01-17 ENCOUNTER — Inpatient Hospital Stay: Payer: 59 | Attending: Hematology and Oncology | Admitting: Licensed Clinical Social Worker

## 2022-01-17 ENCOUNTER — Ambulatory Visit (INDEPENDENT_AMBULATORY_CARE_PROVIDER_SITE_OTHER): Payer: 59 | Admitting: Urology

## 2022-01-17 ENCOUNTER — Other Ambulatory Visit: Payer: Self-pay

## 2022-01-17 ENCOUNTER — Telehealth: Payer: Self-pay

## 2022-01-17 ENCOUNTER — Ambulatory Visit
Admission: RE | Admit: 2022-01-17 | Discharge: 2022-01-17 | Disposition: A | Discharge status: Home / Self Care | Payer: 59 | Source: Ambulatory Visit | Attending: Radiation Oncology | Admitting: Radiation Oncology

## 2022-01-17 VITALS — BP 131/76 | HR 101

## 2022-01-17 DIAGNOSIS — R3915 Urgency of urination: Secondary | ICD-10-CM

## 2022-01-17 DIAGNOSIS — R350 Frequency of micturition: Secondary | ICD-10-CM | POA: Diagnosis not present

## 2022-01-17 DIAGNOSIS — Z510 Encounter for antineoplastic radiation therapy: Secondary | ICD-10-CM | POA: Diagnosis not present

## 2022-01-17 DIAGNOSIS — R351 Nocturia: Secondary | ICD-10-CM

## 2022-01-17 LAB — RAD ONC ARIA SESSION SUMMARY
Course Elapsed Days: 20
Plan Fractions Treated to Date: 15
Plan Prescribed Dose Per Fraction: 2.67 Gy
Plan Total Fractions Prescribed: 15
Plan Total Prescribed Dose: 40.05 Gy
Reference Point Dosage Given to Date: 40.05 Gy
Reference Point Session Dosage Given: 2.67 Gy
Session Number: 15

## 2022-01-17 LAB — BLADDER SCAN AMB NON-IMAGING: Scan Result: 0

## 2022-01-17 NOTE — Telephone Encounter (Signed)
CSW attempted to contact patient per the request of Dr. Isidore Moos regarding disability.  Left a vm after consulting with Garnet Koyanagi, LCSW.

## 2022-01-17 NOTE — Progress Notes (Signed)
[place] [facility]  Clinical Social Work was referred by medical provider for assessment of psychosocial needs.  Clinical Social Worker contacted patient by phone  to offer support and assess for needs.    Patient stated she currently has long term disability through her employer, but it is not sufficient.  Discussed disability through the [facility] and she agreed to apply.  Release of information form was securely emailed to her.  She has used her [employer] account executive.  CSW also provided the contact information for the [facility] Team.  She expressed no other needs.     Margaree Mackintosh, LCSW  Clinical Social Worker [facility]

## 2022-01-18 ENCOUNTER — Ambulatory Visit: Payer: 59

## 2022-01-18 ENCOUNTER — Ambulatory Visit
Admission: RE | Admit: 2022-01-18 | Discharge: 2022-01-18 | Disposition: A | Discharge status: Home / Self Care | Payer: 59 | Source: Ambulatory Visit | Attending: Radiation Oncology | Admitting: Radiation Oncology

## 2022-01-18 ENCOUNTER — Other Ambulatory Visit: Payer: Self-pay

## 2022-01-18 DIAGNOSIS — T451X5A Adverse effect of antineoplastic and immunosuppressive drugs, initial encounter: Secondary | ICD-10-CM

## 2022-01-18 DIAGNOSIS — Z510 Encounter for antineoplastic radiation therapy: Secondary | ICD-10-CM | POA: Diagnosis not present

## 2022-01-18 DIAGNOSIS — R2689 Other abnormalities of gait and mobility: Secondary | ICD-10-CM

## 2022-01-18 DIAGNOSIS — Z170 Estrogen receptor positive status [ER+]: Secondary | ICD-10-CM

## 2022-01-18 DIAGNOSIS — M25612 Stiffness of left shoulder, not elsewhere classified: Secondary | ICD-10-CM

## 2022-01-18 DIAGNOSIS — C50412 Malignant neoplasm of upper-outer quadrant of left female breast: Secondary | ICD-10-CM | POA: Diagnosis not present

## 2022-01-18 DIAGNOSIS — R209 Unspecified disturbances of skin sensation: Secondary | ICD-10-CM

## 2022-01-18 LAB — URINALYSIS, ROUTINE W REFLEX MICROSCOPIC
Bilirubin, UA: NEGATIVE
Glucose, UA: NEGATIVE
Ketones, UA: NEGATIVE
Leukocytes,UA: NEGATIVE
Nitrite, UA: NEGATIVE
Protein,UA: NEGATIVE
Specific Gravity, UA: 1.02 (ref 1.005–1.030)
Urobilinogen, Ur: 0.2 mg/dL (ref 0.2–1.0)
pH, UA: 5.5 (ref 5.0–7.5)

## 2022-01-18 LAB — RAD ONC ARIA SESSION SUMMARY
Course Elapsed Days: 21
Plan Fractions Treated to Date: 1
Plan Prescribed Dose Per Fraction: 2 Gy
Plan Total Fractions Prescribed: 5
Plan Total Prescribed Dose: 10 Gy
Reference Point Dosage Given to Date: 2 Gy
Reference Point Session Dosage Given: 2 Gy
Session Number: 16

## 2022-01-18 LAB — MICROSCOPIC EXAMINATION

## 2022-01-18 NOTE — Therapy (Signed)
OUTPATIENT PHYSICAL THERAPY ONCOLOGY EVALUATION  Patient Name: Darlene Peters MRN: [id number] DOB:April 10, 1970, 51 y.o., female Today's Date: 01/18/2022   PT End of Session - 01/18/22 0928     Visit Number 3    Number of Visits 18    Date for PT Re-Evaluation 02/08/22    PT Start Time 0928    PT Stop Time 0958    PT Time Calculation (min) 30 min    Activity Tolerance Patient tolerated treatment well    Behavior During Therapy [facility] for tasks assessed/performed             Past Medical History:  Diagnosis Date   Asthma    "worse when lying down"   Breast cancer ([place])    Family history of breast cancer 07/19/2021   Family history of prostate cancer 07/19/2021   GERD (gastroesophageal reflux disease)    Psoriasis    Urticaria    Past Surgical History:  Procedure Laterality Date   ABDOMINAL HYSTERECTOMY     partial   BREAST BIOPSY Left 05/26/2021   BREAST LUMPECTOMY WITH RADIOACTIVE SEED AND SENTINEL LYMPH NODE BIOPSY Left 08/03/2021   Procedure: LEFT BREAST LUMPECTOMY WITH RADIOACTIVE SEED AND SENTINEL LYMPH NODE BIOPSY;  Surgeon: Jovita Kussmaul, MD;  Location: [place];  Service: General;  Laterality: Left;   IR IMAGING GUIDED PORT INSERTION  09/26/2021   RADIOACTIVE SEED GUIDED AXILLARY SENTINEL LYMPH NODE Left 08/03/2021   Procedure: RADIOACTIVE SEED GUIDED LEFT AXILLARY SENTINEL LYMPH NODE DISSECTION;  Surgeon: Jovita Kussmaul, MD;  Location: [place];  Service: General;  Laterality: Left;   TUMOR REMOVAL Left 08/2021   breast   Patient Active Problem List   Diagnosis Date Noted   Port-A-Cath in place 10/20/2021   Bacteremia 10/03/2021   Syncope 10/03/2021   Constipation 10/03/2021   Genetic testing 07/27/2021   Family history of breast cancer 07/19/2021   Family history of prostate cancer 07/19/2021   Anxiety 06/22/2021   Difficulty sleeping 06/22/2021   Major depressive disorder, single episode, unspecified 06/22/2021    Malignant neoplasm of upper-outer quadrant of left breast in female, estrogen receptor positive ([place]) 06/14/2021   Other fatigue 11/05/2019   Psoriasis 10/14/2019   Gastroesophageal reflux disease without esophagitis 05/01/2019   Intermittent asthma 07/05/2018   Low back pain 01/11/2017    PCP: Maebelle Munroe, DO  REFERRING PROVIDER: Wilber Bihari NP  REFERRING DIAG: s/p Left Breast Cancer  THERAPY DIAG:  Malignant neoplasm of upper-outer quadrant of left breast in female, estrogen receptor positive ([place])  Stiffness of left shoulder, not elsewhere classified  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 Skin is doing OK. I still have muscle pain in gluts, thighs, legs, arms,fingers, calves. It must be from the chemo  PERTINENT HISTORY:  Patient was diagnosed on 05/31/2021 with left Breast Cancer. It measures 2.6 cm and is located in the upper-outer quadrant. It is ER+,PR-, HER 2 - with a Ki67 of <5%.  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 0/6 LN. She had adjuvant chemotherapy with Taxotere/Cytoxan every 21 Days x 4 cycles ending on 12/01/2021, and adjuvant radiation starting on 12/28/2021. She will be having 4 weeks or radiation.   PAIN:  Are you having pain? Yes NPRS scale: 5/10 Pain location: bilateral hands, upperback/  Pain orientation: Left  PAIN TYPE: dull Pain description: constant  Aggravating factors: lying down, rising from sitting, walking Relieving factors: massager on legs, shoulder blade, stretching,  aquatics  PRECAUTIONS: Left UE lymphedema risk, neuropathy, psoriasis  WEIGHT BEARING RESTRICTIONS: No  FALLS:  Has patient fallen in last 6 months? Yes. Number of falls 1  LIVING ENVIRONMENT: Lives with: lives with their spouse Lives in: House/apartment Stairs: Yes; External: 3 steps; on left going up Has following equipment at home: None  OCCUPATION: Not presently working  LEISURE: walking,  reading  HAND DOMINANCE: right   PRIOR LEVEL OF FUNCTION: Independent  PATIENT GOALS: Get stronger, less pain,more stamina and balance, more energy   OBJECTIVE:  COGNITION: Overall cognitive status: Within functional limits for tasks assessed   PALPATION: NT  OBSERVATIONS / OTHER ASSESSMENTS: slumped sitting posture, with gait normal gait over 50 feet with one step off center before returning  SENSATION: Light touch: Deficits back of arm  POSTURE: forward head, rounded shoulders  UPPER EXTREMITY AROM/PROM:  A/PROM RIGHT   eval   Shoulder extension 64  Shoulder flexion 165  Shoulder abduction 180  Shoulder internal rotation 75  Shoulder external rotation 110    (Blank rows = not tested)  A/PROM LEFT   eval  Shoulder extension 65  Shoulder flexion 150  Shoulder abduction 170  Shoulder internal rotation 65  Shoulder external rotation 105    (Blank rows = not tested)  CERVICAL AROM: All within functional limits:     UPPER EXTREMITY STRENGTH: NT   LOWER EXTREMITY AROM/PROM:  A/PROM Right eval  Hip flexion 4  Hip extension   Hip abduction 5  Hip adduction 4  Hip internal rotation 4-  Hip external rotation 3+  Knee flexion 5  Knee extension 4  Ankle dorsiflexion 5  Ankle plantarflexion 5  Ankle inversion   Ankle eversion    (Blank rows = not tested)  A/PROM LEFT eval  Hip flexion 4  Hip extension   Hip abduction 5 seated  Hip adduction 4+ seated  Hip internal rotation 4  Hip external rotation 4  Knee flexion 5  Knee extension 4+  Ankle  dorsiflexion 5  Ankle plantarflexion 5  Ankle inversion   Ankle eversion    (Blank rows = not tested)  LOWER EXTREMITY MMT: see above  LYMPHEDEMA ASSESSMENTS:   SURGERY TYPE/DATE: 08/03/2021 Left Lumpectomy with deep axillary SLNB  NUMBER OF LYMPH NODES REMOVED: 0/6  CHEMOTHERAPY: 4 cycles, ended 12/01/2021  RADIATION:to start 12/28/2021  HORMONE TREATMENT: will start after radiation  INFECTIONS: Bacterial infection in blood in July 2023  LYMPHEDEMA ASSESSMENTS:   LANDMARK RIGHT  eval  10 cm proximal to olecranon process 32.3  Olecranon process 26.6  10 cm proximal to ulnar styloid process 20.8  Just proximal to ulnar styloid process 15.3  Across hand at thumb web space 19.6  At base of 2nd digit 6.1  (Blank rows = not tested)  LANDMARK LEFT  eval  10 cm proximal to olecranon process 31.4  Olecranon process 25.9  10 cm proximal to ulnar styloid process 19.3  Just proximal to ulnar styloid process 14.5  Across hand at thumb web space 19.2  At base of 2nd digit 5.8  (Blank rows = not tested)  FUNCTIONAL TESTS:  30 seconds chair stand test  8 repetitions Timed up and go (TUG): 4 position balance test; Unable to hold tandem stance on either side  for more than 3 secs,, trunk sway with all positions  GAIT: Distance walked: 50 feet Assistive device utilized: None Level of assistance: Complete Independence Comments: 1 minor step off course, no LOB  L-DEX LYMPHEDEMA SCREENING:  The patient was assessed using the L-Dex machine today to produce a lymphedema index baseline score. The patient will be reassessed on a regular basis (typically every 3 months) to obtain new L-Dex scores. If the score is > 6.5 points away from his/her baseline score indicating onset of subclinical lymphedema, it will be recommended to wear a compression garment for 4 weeks, 12 hours per day and then be reassessed. If the score continues to be > 6.5 points from baseline at reassessment, we will  initiate lymphedema treatment. Assessing in this manner has a 95% rate of preventing clinically significant lymphedema.     QUICK DASH SURVEY: 68%   TODAY'S TREATMENT:                                                                                                                           DATE:   01/18/2022 Nearly 30 min late Nu step x 6 min seat 11, UE 10, lev 2 In parallel bars: on airex heel raises x 15 no HH, marching x 10 ea no HH,forward and lateral step ups bilaterally x 10, tandem stance x 1 B, DLS on ax eyes closed 3 x 30 sec Scapular retraction and shoulder ext red band x 10 ea with emphasis on good posture and stability  01/11/2022 Performed pt sozo screen and pt is well within the green range Pulleys x 2 min for shoulder flexion and abduction with occasional VC's for technique to assist shoulder ROM Nustep seat 11, UE 10 lev. 2 x 5 Sit to stand 2 x 5 from mat table. In parallel bars: heel raises x 15 no HH, marching x 10 ea no HH, Tandem stance each side x3  for balance Standing gastroc stretch at wall 3 x 20 sec ea, seated HS stretch x3 20 sec SLS x 5 ea Updated HEP  12/28/2021 Evaluation, discussed POC, aquatics etc. Messaged [facility] to see if pt can do aquatic therapy with her porta cath and radiation  PATIENT EDUCATION:  Education details: POC,  aquatics,Access Code: [email or web address] URL: [email or web address] Date: 01/11/2022 Prepared by: Cheral Almas  Exercises - Sit to Stand Without Arm Support  - 2 x daily - 7 x weekly - 2 sets - 5 reps - Standing Heel Raise  - 2 x daily - 7 x weekly - 1 sets - 10 reps - Single Leg Stance  - 1 x daily - 7 x weekly - 1 sets - 5 reps - 10 hold - Standing Hip Flexion March  - 1 x daily - 7 x weekly - 3 sets - 10 reps - Gastroc Stretch on Wall  - 2 x daily - 7 x weekly - 1 sets - 3 reps - 20 hold - Seated Hamstring Stretch  - 1 x daily - 7 x weekly - 1 sets - 3 reps - 20 hold Person educated: Patient Education method:  Explanation Education comprehension: verbalized understanding,demonstrated  HOME EXERCISE PROGRAM: Sit to Stand Without Arm Support  - 2 x daily - 7 x weekly - 2 sets - 5 reps - Standing Heel Raise  - 2 x daily - 7 x weekly - 1 sets - 10 reps - Single Leg Stance  - 1 x daily - 7 x weekly - 1 sets - 5 reps - 10 hold - Standing Hip Flexion March  - 1 x daily - 7 x weekly - 3 sets - 10 reps - Gastroc Stretch on Wall  - 2 x daily - 7 x weekly - 1 sets - 3 reps - 20 hold - Seated Hamstring Stretch  - 1 x daily - 7 x weekly - 1 sets - 3 reps - 20 ho  ASSESSMENT:  CLINICAL IMPRESSION:  Pt did very well with advancement of exs on ax with only occasional LOB. Treatment time was decreased secondary to pt late. Pdt still complaining of muscle pain likely from chemo OBJECTIVE IMPAIRMENTS: decreased balance, decreased endurance, decreased ROM, decreased strength, impaired UE functional use, postural dysfunction, and pain.   ACTIVITY LIMITATIONS: lifting, standing, sleeping, bed mobility, reach over head, and locomotion level  PARTICIPATION LIMITATIONS: driving, occupation, and recreational activities such as walking  PERSONAL FACTORS: 3+ comorbidities: Left breast cancer s/p chemo and presently having radiation  are also affecting patient's functional outcome.   REHAB POTENTIAL: Good  CLINICAL DECISION MAKING: Stable/uncomplicated  EVALUATION COMPLEXITY: Low  GOALS: Goals reviewed with patient? Yes  SHORT TERM GOALS: Target date: 02/08/2022    Pt will be independent in a HEP for LE strength and balance Baseline: Goal status: INITIAL  2.  Pt will restore left shoulder ROM to WNL for improved ease with radiation Baseline:  Goal status: INITIAL  3.  Pt will be able to perform 11 sit to stands in 30 seconds Baseline:  Goal status: INITIAL    LONG TERM GOALS: Target date: 03/01/2022    Pts quick dash will be no greater than 20% Baseline:  Goal status: INITIAL  2.  Pt will be able  to maintain tandem stance with either foot forward x 10-15 seconds to demonstrate improved balance Baseline:  Goal status: INITIAL  3.  Pt will be able to perform 15 sit to stands in 30 seconds to decrease fall risk Baseline:  Goal status: INITIAL  4.  Pt will maintain feet together stance 20 seconds without sway Baseline:  Goal status: INITIAL  5.  Pt will have 5/5 strength bilateral LE's for improved safety Baseline:  Goal status: INITIAL  6.  Pt will be independent in aquatic exs so  she may join a Y or other gym Baseline:  Goal status: INITIAL  PLAN:  PT FREQUENCY: 3x/week  PT DURATION: 6 weeks  PLANNED INTERVENTIONS: Therapeutic exercises, Therapeutic activity, Neuromuscular re-education, Balance training, Gait training, Patient/Family education, Self Care, Joint mobilization, Orthotic/Fit training, Aquatic Therapy, Dry Needling, scar mobilization, and Manual therapy  PLAN FOR NEXT SESSION: ,Shoulder ROM;wand exs flex, scaption, ER, PROM HEP: sit to stand,balance, generalized strength, grip Aquatic therapy if allowed with port and radiation (messaged [place] today) OK with Mendel Ryder per inbox   Claris Pong, PT 01/18/2022, 9:59 AM

## 2022-01-19 ENCOUNTER — Other Ambulatory Visit: Payer: Self-pay

## 2022-01-19 ENCOUNTER — Ambulatory Visit
Admission: RE | Admit: 2022-01-19 | Discharge: 2022-01-19 | Disposition: A | Discharge status: Home / Self Care | Payer: 59 | Source: Ambulatory Visit | Attending: Radiation Oncology | Admitting: Radiation Oncology

## 2022-01-19 DIAGNOSIS — Z510 Encounter for antineoplastic radiation therapy: Secondary | ICD-10-CM | POA: Diagnosis not present

## 2022-01-19 LAB — RAD ONC ARIA SESSION SUMMARY
Course Elapsed Days: 22
Plan Fractions Treated to Date: 2
Plan Prescribed Dose Per Fraction: 2 Gy
Plan Total Fractions Prescribed: 5
Plan Total Prescribed Dose: 10 Gy
Reference Point Dosage Given to Date: 4 Gy
Reference Point Session Dosage Given: 2 Gy
Session Number: 17

## 2022-01-20 ENCOUNTER — Other Ambulatory Visit: Payer: Self-pay

## 2022-01-20 ENCOUNTER — Ambulatory Visit
Admission: RE | Admit: 2022-01-20 | Discharge: 2022-01-20 | Disposition: A | Discharge status: Home / Self Care | Payer: 59 | Source: Ambulatory Visit | Attending: Radiation Oncology | Admitting: Radiation Oncology

## 2022-01-20 ENCOUNTER — Encounter: Payer: Self-pay | Admitting: *Deleted

## 2022-01-20 DIAGNOSIS — Z170 Estrogen receptor positive status [ER+]: Secondary | ICD-10-CM

## 2022-01-20 DIAGNOSIS — Z510 Encounter for antineoplastic radiation therapy: Secondary | ICD-10-CM | POA: Diagnosis not present

## 2022-01-20 LAB — RAD ONC ARIA SESSION SUMMARY
Course Elapsed Days: 23
Plan Fractions Treated to Date: 3
Plan Prescribed Dose Per Fraction: 2 Gy
Plan Total Fractions Prescribed: 5
Plan Total Prescribed Dose: 10 Gy
Reference Point Dosage Given to Date: 6 Gy
Reference Point Session Dosage Given: 2 Gy
Session Number: 18

## 2022-01-23 ENCOUNTER — Other Ambulatory Visit: Payer: Self-pay

## 2022-01-23 ENCOUNTER — Telehealth: Payer: Self-pay

## 2022-01-23 ENCOUNTER — Ambulatory Visit: Payer: 59

## 2022-01-23 ENCOUNTER — Ambulatory Visit
Admission: RE | Admit: 2022-01-23 | Discharge: 2022-01-23 | Disposition: A | Discharge status: Home / Self Care | Payer: 59 | Source: Ambulatory Visit | Attending: Radiation Oncology | Admitting: Radiation Oncology

## 2022-01-23 DIAGNOSIS — Z510 Encounter for antineoplastic radiation therapy: Secondary | ICD-10-CM | POA: Diagnosis not present

## 2022-01-23 LAB — RAD ONC ARIA SESSION SUMMARY
Course Elapsed Days: 26
Plan Fractions Treated to Date: 4
Plan Prescribed Dose Per Fraction: 2 Gy
Plan Total Fractions Prescribed: 5
Plan Total Prescribed Dose: 10 Gy
Reference Point Dosage Given to Date: 8 Gy
Reference Point Session Dosage Given: 2 Gy
Session Number: 19

## 2022-01-23 NOTE — Telephone Encounter (Signed)
Darlene Peters, counseling intern, called patient after receiving a referral from the patient's nurse.   The patient did not answer their phone. The counselor will try calling the patient back next week.   Darlene Peters,  Counseling Intern  [phone] [email or web address]

## 2022-01-24 ENCOUNTER — Ambulatory Visit: Payer: 59

## 2022-01-24 ENCOUNTER — Other Ambulatory Visit: Payer: Self-pay

## 2022-01-24 ENCOUNTER — Encounter: Payer: Self-pay | Admitting: Radiation Oncology

## 2022-01-24 ENCOUNTER — Ambulatory Visit
Admission: RE | Admit: 2022-01-24 | Discharge: 2022-01-24 | Disposition: A | Discharge status: Home / Self Care | Payer: 59 | Source: Ambulatory Visit | Attending: Radiation Oncology | Admitting: Radiation Oncology

## 2022-01-24 DIAGNOSIS — Z510 Encounter for antineoplastic radiation therapy: Secondary | ICD-10-CM | POA: Diagnosis not present

## 2022-01-24 DIAGNOSIS — R293 Abnormal posture: Secondary | ICD-10-CM

## 2022-01-24 DIAGNOSIS — R209 Unspecified disturbances of skin sensation: Secondary | ICD-10-CM

## 2022-01-24 DIAGNOSIS — Z170 Estrogen receptor positive status [ER+]: Secondary | ICD-10-CM

## 2022-01-24 DIAGNOSIS — C50412 Malignant neoplasm of upper-outer quadrant of left female breast: Secondary | ICD-10-CM | POA: Diagnosis not present

## 2022-01-24 DIAGNOSIS — R2689 Other abnormalities of gait and mobility: Secondary | ICD-10-CM

## 2022-01-24 DIAGNOSIS — R600 Localized edema: Secondary | ICD-10-CM

## 2022-01-24 DIAGNOSIS — T451X5A Adverse effect of antineoplastic and immunosuppressive drugs, initial encounter: Secondary | ICD-10-CM

## 2022-01-24 LAB — RAD ONC ARIA SESSION SUMMARY
Course Elapsed Days: 27
Plan Fractions Treated to Date: 5
Plan Prescribed Dose Per Fraction: 2 Gy
Plan Total Fractions Prescribed: 5
Plan Total Prescribed Dose: 10 Gy
Reference Point Dosage Given to Date: 10 Gy
Reference Point Session Dosage Given: 2 Gy
Session Number: 20

## 2022-01-24 NOTE — Therapy (Signed)
OUTPATIENT PHYSICAL THERAPY ONCOLOGY EVALUATION  Patient Name: Darlene Peters MRN: [id number] DOB:1971-01-12, 51 y.o., female Today's Date: 01/24/2022   PT End of Session - 01/24/22 0909     Visit Number 4    Number of Visits 18    Date for PT Re-Evaluation 02/08/22    PT Start Time 0906    PT Stop Time 0952    PT Time Calculation (min) 46 min    Activity Tolerance Patient tolerated treatment well    Behavior During Therapy [facility] for tasks assessed/performed              Past Medical History:  Diagnosis Date   Asthma    "worse when lying down"   Breast cancer ([place])    Family history of breast cancer 07/19/2021   Family history of prostate cancer 07/19/2021   GERD (gastroesophageal reflux disease)    Psoriasis    Urticaria    Past Surgical History:  Procedure Laterality Date   ABDOMINAL HYSTERECTOMY     partial   BREAST BIOPSY Left 05/26/2021   BREAST LUMPECTOMY WITH RADIOACTIVE SEED AND SENTINEL LYMPH NODE BIOPSY Left 08/03/2021   Procedure: LEFT BREAST LUMPECTOMY WITH RADIOACTIVE SEED AND SENTINEL LYMPH NODE BIOPSY;  Surgeon: Jovita Kussmaul, MD;  Location: [place];  Service: General;  Laterality: Left;   IR IMAGING GUIDED PORT INSERTION  09/26/2021   RADIOACTIVE SEED GUIDED AXILLARY SENTINEL LYMPH NODE Left 08/03/2021   Procedure: RADIOACTIVE SEED GUIDED LEFT AXILLARY SENTINEL LYMPH NODE DISSECTION;  Surgeon: Jovita Kussmaul, MD;  Location: [place];  Service: General;  Laterality: Left;   TUMOR REMOVAL Left 08/2021   breast   Patient Active Problem List   Diagnosis Date Noted   Port-A-Cath in place 10/20/2021   Bacteremia 10/03/2021   Syncope 10/03/2021   Constipation 10/03/2021   Genetic testing 07/27/2021   Family history of breast cancer 07/19/2021   Family history of prostate cancer 07/19/2021   Anxiety 06/22/2021   Difficulty sleeping 06/22/2021   Major depressive disorder, single episode, unspecified 06/22/2021    Malignant neoplasm of upper-outer quadrant of left breast in female, estrogen receptor positive ([employer]) 06/14/2021   Other fatigue 11/05/2019   Psoriasis 10/14/2019   Gastroesophageal reflux disease without esophagitis 05/01/2019   Intermittent asthma 07/05/2018   Low back pain 01/11/2017    PCP: Maebelle Munroe, DO  REFERRING PROVIDER: Wilber Bihari NP  REFERRING DIAG: s/p Left Breast Cancer  THERAPY DIAG:  Malignant neoplasm of upper-outer quadrant of left breast in female, estrogen receptor positive (HCC)  Paclitaxel induced neuropathy (HCC)  Loss of balance  Disturbance of skin sensation  Localized edema  Abnormal posture  ONSET DATE: 06/14/2021  Rationale for Evaluation and Treatment Rehabilitation  SUBJECTIVE:  SUBJECTIVE STATEMENT: I am in my last week of radiation this week. I still have to follow up with the MD's. My muscles are still really sore, but they said its a side effect of the chemo. My bones started hurting with the second chemo and my muscles with the third chemo. Getting up and down from chairs is hard. My fingers are numb and hurt and its hard to wash dishes.  PERTINENT HISTORY:  Patient was diagnosed on 05/31/2021 with left Breast Cancer. It measures 2.6 cm and is located in the upper-outer quadrant. It is ER+,PR-, HER 2 - with a Ki67 of <5%.  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 0/6 LN. She had adjuvant chemotherapy with Taxotere/Cytoxan every 21 Days x 4 cycles ending on 12/01/2021, and adjuvant radiation starting on 12/28/2021. She will be having 4 weeks or radiation.   PAIN:  Are you having pain? Yes NPRS scale: 5/10 Pain location: bilateral hands, upperback/ toes, leg muscles Pain  orientation: Left  PAIN TYPE: dull Pain description: constant  Aggravating factors: lying down, rising from sitting, walking Relieving factors: massager on legs, shoulder blade, stretching, aquatics  PRECAUTIONS: Left UE lymphedema risk, neuropathy, psoriasis  WEIGHT BEARING RESTRICTIONS: No  FALLS:  Has patient fallen in last 6 months? Yes. Number of falls 1  LIVING ENVIRONMENT: Lives with: lives with their spouse Lives in: House/apartment Stairs: Yes; External: 3 steps; on left going up Has following equipment at home: None  OCCUPATION: Not presently working  LEISURE: walking,  reading  HAND DOMINANCE: right   PRIOR LEVEL OF FUNCTION: Independent  PATIENT GOALS: Get stronger, less pain,more stamina and balance, more energy   OBJECTIVE:  COGNITION: Overall cognitive status: Within functional limits for tasks assessed   PALPATION: NT  OBSERVATIONS / OTHER ASSESSMENTS: slumped sitting posture, with gait normal gait over 50 feet with one step off center before returning  SENSATION: Light touch: Deficits back of arm  POSTURE: forward head, rounded shoulders  UPPER EXTREMITY AROM/PROM:  A/PROM RIGHT   eval   Shoulder extension 64  Shoulder flexion 165  Shoulder abduction 180  Shoulder internal rotation 75  Shoulder external rotation 110    (Blank rows = not tested)  A/PROM LEFT   eval  Shoulder extension 65  Shoulder flexion 150  Shoulder abduction 170  Shoulder internal rotation 65  Shoulder external rotation 105    (Blank rows = not tested)  CERVICAL AROM: All within functional limits:     UPPER EXTREMITY STRENGTH: NT   LOWER EXTREMITY AROM/PROM:  A/PROM Right eval  Hip flexion 4  Hip extension   Hip abduction 5  Hip adduction 4  Hip internal rotation 4-  Hip external rotation 3+  Knee flexion 5  Knee extension 4  Ankle dorsiflexion 5  Ankle plantarflexion 5  Ankle inversion   Ankle eversion    (Blank rows = not tested)  A/PROM  LEFT eval  Hip flexion 4  Hip extension   Hip abduction 5 seated  Hip adduction 4+ seated  Hip internal rotation 4  Hip external rotation 4  Knee flexion 5  Knee extension 4+  Ankle dorsiflexion 5  Ankle plantarflexion 5  Ankle inversion   Ankle eversion    (Blank rows = not tested)  LOWER EXTREMITY MMT: see above  LYMPHEDEMA ASSESSMENTS:   SURGERY TYPE/DATE: 08/03/2021 Left Lumpectomy with deep axillary SLNB  NUMBER OF LYMPH NODES REMOVED: 0/6  CHEMOTHERAPY: 4 cycles, ended 12/01/2021  RADIATION:to start 12/28/2021  HORMONE TREATMENT: will start after radiation  INFECTIONS: Bacterial infection in blood in July 2023  LYMPHEDEMA ASSESSMENTS:   LANDMARK RIGHT  eval  10 cm proximal to olecranon process 32.3  Olecranon process 26.6  10 cm proximal to ulnar styloid process 20.8  Just proximal to ulnar styloid process 15.3  Across hand at thumb web space 19.6  At base of 2nd digit 6.1  (Blank rows = not tested)  LANDMARK LEFT  eval  10 cm proximal to olecranon process 31.4  Olecranon process 25.9  10 cm proximal to ulnar styloid process 19.3  Just proximal to ulnar styloid process 14.5  Across hand at thumb web space 19.2  At base of 2nd digit 5.8  (Blank rows = not tested)  FUNCTIONAL TESTS:  30 seconds chair stand test  8 repetitions Timed up and go (TUG): 4 position balance test; Unable to hold tandem stance on either side  for more than 3 secs,, trunk sway with all positions  GAIT: Distance walked: 50 feet Assistive device utilized: None Level of assistance: Complete Independence Comments: 1 minor step off course, no LOB  L-DEX LYMPHEDEMA SCREENING:  The patient was assessed using the L-Dex machine today to produce a lymphedema index baseline score. The patient will be reassessed on a regular basis (typically every 3 months) to obtain new L-Dex scores. If the score is > 6.5 points away from his/her baseline score indicating onset of subclinical  lymphedema, it will be recommended to wear a compression garment for 4 weeks, 12 hours per day and then be reassessed. If the score continues to be > 6.5 points from baseline at reassessment, we will initiate lymphedema treatment. Assessing in this manner has a 95% rate of preventing clinically significant lymphedema.     QUICK DASH SURVEY: 68%   TODAY'S TREATMENT:                                                                                                                           DATE:   01/25/2022 Nu Step x 8 min seat 11, UE 10 Lev 4 Sit to stand 3x5 Sidestepping with red band 6 lengths in parallel bars 3 D hip with red band x 10 GL:OVFIEPP, abd, extension Monster walks red band 22 steps each direction Scapular retraction and shoulder extension x 15 ea with red band AX beam forward x 8 lengths normal step length x 4 and tandem x 4, sidestepping x 4 lengths Step ups on ax with alternate hip flexion x 10 ea Bilateral Quad stretch x 2 standing 01/18/2022 Nearly 30 min late Nu step x 6 min seat 11, UE 10, lev 2 In parallel bars: on airex heel raises x 15 no HH, marching x 10 ea no HH,forward and lateral step ups bilaterally x 10, tandem stance x 1 B, DLS on ax eyes closed 3 x 30 sec Scapular retraction and shoulder ext red band x  10 ea with emphasis on good posture and stability  01/11/2022 Performed pt sozo screen and pt is well within the green range Pulleys x 2 min for shoulder flexion and abduction with occasional VC's for technique to assist shoulder ROM Nustep seat 11, UE 10 lev. 2 x 5 Sit to stand 2 x 5 from mat table. In parallel bars: heel raises x 15 no HH, marching x 10 ea no HH, Tandem stance each side x3  for balance Standing gastroc stretch at wall 3 x 20 sec ea, seated HS stretch x3 20 sec SLS x 5 ea Updated HEP  12/28/2021 Evaluation, discussed POC, aquatics etc. Messaged Lindsey to see if pt can do aquatic therapy with her porta cath and radiation  PATIENT  EDUCATION:  Education details: POC, aquatics,Access Code: [email or web address] URL: [email or web address] Date: 01/11/2022 Prepared by: Cheral Almas  Exercises - Sit to Stand Without Arm Support  - 2 x daily - 7 x weekly - 2 sets - 5 reps - Standing Heel Raise  - 2 x daily - 7 x weekly - 1 sets - 10 reps - Single Leg Stance  - 1 x daily - 7 x weekly - 1 sets - 5 reps - 10 hold - Standing Hip Flexion March  - 1 x daily - 7 x weekly - 3 sets - 10 reps - Gastroc Stretch on Wall  - 2 x daily - 7 x weekly - 1 sets - 3 reps - 20 hold - Seated Hamstring Stretch  - 1 x daily - 7 x weekly - 1 sets - 3 reps - 20 hold Person educated: Patient Education method: Explanation Education comprehension: verbalized understanding,demonstrated  HOME EXERCISE PROGRAM: Sit to Stand Without Arm Support  - 2 x daily - 7 x weekly - 2 sets - 5 reps - Standing Heel Raise  - 2 x daily - 7 x weekly - 1 sets - 10 reps - Single Leg Stance  - 1 x daily - 7 x weekly - 1 sets - 5 reps - 10 hold - Standing Hip Flexion March  - 1 x daily - 7 x weekly - 3 sets - 10 reps - Gastroc Stretch on Wall  - 2 x daily - 7 x weekly - 1 sets - 3 reps - 20 hold - Seated Hamstring Stretch  - 1 x daily - 7 x weekly - 1 sets - 3 reps - 20 ho  ASSESSMENT:  CLINICAL IMPRESSION: Pt did well with band exercises with only occasional VC's for form. She demonstrated good balance on airex beam with regular step length but had slightly more difficulty with tandem walking. Pt complains of decreased LE flexibility and was advised to remember to stretch 2x's per day.  OBJECTIVE IMPAIRMENTS: decreased balance, decreased endurance, decreased ROM, decreased strength, impaired UE functional use, postural dysfunction, and pain.   ACTIVITY LIMITATIONS: lifting, standing, sleeping, bed mobility, reach over head, and locomotion level  PARTICIPATION LIMITATIONS: driving, occupation, and recreational activities such as walking  PERSONAL FACTORS: 3+  comorbidities: Left breast cancer s/p chemo and presently having radiation  are also affecting patient's functional outcome.   REHAB POTENTIAL: Good  CLINICAL DECISION MAKING: Stable/uncomplicated  EVALUATION COMPLEXITY: Low  GOALS: Goals reviewed with patient? Yes  SHORT TERM GOALS: Target date: 02/14/2022    Pt will be independent in a HEP for LE strength and balance Baseline: Goal status: INITIAL  2.  Pt will restore left shoulder ROM to WNL for improved ease with radiation Baseline:  Goal status: INITIAL  3.  Pt will be able to perform 11 sit to stands in 30 seconds Baseline:  Goal status: INITIAL    LONG TERM GOALS: Target date: 03/07/2022    Pts quick dash will be no greater than 20% Baseline:  Goal status: INITIAL  2.  Pt will be able to maintain tandem stance with either foot forward x 10-15 seconds to demonstrate improved balance Baseline:  Goal status: INITIAL  3.  Pt will be able to perform 15 sit to stands in 30 seconds to decrease fall risk Baseline:  Goal status: INITIAL  4.  Pt will maintain feet together stance 20 seconds without sway Baseline:  Goal status: INITIAL  5.  Pt will have 5/5 strength bilateral LE's for improved safety Baseline:  Goal status: INITIAL  6.  Pt will be independent in aquatic exs so she may join a Y or other gym Baseline:  Goal status: INITIAL  PLAN:  PT FREQUENCY: 3x/week  PT DURATION: 6 weeks  PLANNED INTERVENTIONS: Therapeutic exercises, Therapeutic activity, Neuromuscular re-education, Balance training, Gait training, Patient/Family education, Self Care, Joint mobilization, Orthotic/Fit training, Aquatic Therapy, Dry Needling, scar mobilization, and Manual therapy  PLAN FOR NEXT SESSION: ,Shoulder ROM;wand exs flex, scaption, ER, PROM HEP: sit to stand,balance, generalized strength, grip Aquatic therapy if allowed with port and radiation (messaged [place] today) OK with Mendel Ryder per inbox   Claris Pong,  PT 01/24/2022, 9:54 AM

## 2022-01-25 ENCOUNTER — Inpatient Hospital Stay (HOSPITAL_BASED_OUTPATIENT_CLINIC_OR_DEPARTMENT_OTHER): Payer: 59 | Admitting: Hematology and Oncology

## 2022-01-25 ENCOUNTER — Other Ambulatory Visit (HOSPITAL_COMMUNITY): Payer: Self-pay

## 2022-01-25 ENCOUNTER — Encounter: Payer: Self-pay | Admitting: Hematology and Oncology

## 2022-01-25 VITALS — BP 128/73 | HR 76 | Temp 98.0°F | Resp 16 | Ht 69.0 in | Wt 198.5 lb

## 2022-01-25 DIAGNOSIS — C50412 Malignant neoplasm of upper-outer quadrant of left female breast: Secondary | ICD-10-CM

## 2022-01-25 DIAGNOSIS — Z510 Encounter for antineoplastic radiation therapy: Secondary | ICD-10-CM | POA: Diagnosis not present

## 2022-01-25 DIAGNOSIS — Z170 Estrogen receptor positive status [ER+]: Secondary | ICD-10-CM

## 2022-01-25 MED ORDER — TAMOXIFEN CITRATE 20 MG PO TABS
20.0000 mg | ORAL_TABLET | Freq: Every day | ORAL | 3 refills | Status: AC
  Filled 2022-01-25: qty 30, 30d supply, fill #0
  Filled 2022-02-26: qty 30, 30d supply, fill #1
  Filled 2022-05-05: qty 30, 30d supply, fill #0
  Filled 2022-05-05: qty 30, 30d supply, fill #2

## 2022-01-25 NOTE — Assessment & Plan Note (Addendum)
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  She is now postsurgery and is here to discuss Oncotype results and role of chemotherapy.  We have discussed final pathology as well as Oncotype score which resulted at 28, distant recurrence risk at 9 years of 17%, absolute benefit of chemotherapy greater than 15%.  Given young age, excellent PS, Oncotype score 28, we have discussed about adjuvant chemotherapy with TC every 21 days for 4 cycles.  We have discussed about adverse effects of chemotherapy including but not limited to fatigue, nausea, alopecia vomiting, increased risk of infections, neuropathy etc.  She understands that some of the side effects can be permanent.   She is s/p adjuvant chemotherapy TC *4 12/01/2021 She is now adjuvant radiation, completed 01/24/2022 She is now here to initiate anti estrogen therapy.   We have discussed Tamoxifen for anti estrogen therapy, since she is likely pre menopausal. We have discussed adverse effects of Tamoxifen including pre menopausal symptoms, DVT.PE, endometrial hyperplasia or endometrial cancer. She had a partial hysterectomy. She is willing to try tamoxifen. Medication dispensed to the pharmacy of her choice. She will start it in 2 weeks Encouraged exercise, PT as well. RTC in 4 months.  Benay Pike MD

## 2022-01-25 NOTE — Progress Notes (Signed)
Dr. Gilberto Better is [facility] [place] CONSULT NOTE  Patient Care Team: Sueanne Margarita, DO as PCP - General (Internal Medicine) Rockwell Germany, RN as Oncology Nurse Navigator Mauro Kaufmann, RN as Oncology Nurse Navigator Benay Pike, MD as Consulting Physician (Hematology and Oncology) Jovita Kussmaul, MD as Consulting Physician (General Surgery) Eppie Gibson, MD as Attending Physician (Radiation Oncology)  CHIEF COMPLAINTS/PURPOSE OF CONSULTATION:  Newly diagnosed breast cancer  HISTORY OF PRESENTING ILLNESS:  Darlene Peters 51 y.o. female is here because of recent diagnosis of left breast cancer  I reviewed her records extensively and collaborated the history with the patient.  SUMMARY OF ONCOLOGIC HISTORY: Oncology History  Malignant neoplasm of upper-outer quadrant of left breast in female, estrogen receptor positive ([place])  06/14/2021 Initial Diagnosis   Malignant neoplasm of upper-outer quadrant of left breast in female, estrogen receptor positive ([place])   07/26/2021 Genetic Testing   Negative hereditary cancer genetic testing: no pathogenic variants detected in [facility] BRCAPlus Panel and CancerNext +RNAinsight Panel.  Report dates are Jul 26, 2021 and August 08, 2021.   The BRCAplus panel offered by [employer] and includes sequencing and deletion/duplication analysis for the following 8 genes: ATM, BRCA1, BRCA2, CDH1, CHEK2, PALB2, PTEN, and TP53.  The CancerNext gene panel offered by [employer] includes sequencing, rearrangement analysis, and RNA analysis for the following 36 genes:   APC, ATM, AXIN2, BARD1, BMPR1A, BRCA1, BRCA2, BRIP1, CDH1, CDK4, CDKN2A, CHEK2, DICER1, HOXB13, EPCAM, GREM1, MLH1, MSH2, MSH3, MSH6, MUTYH, NBN, NF1, NTHL1, PALB2, PMS2, POLD1, POLE, PTEN, RAD51C, RAD51D, RECQL, SMAD4, SMARCA4, STK11, and TP53.    08/03/2021 Definitive Surgery   She had left breast lumpectomy with invasive ductal carcinoma, 2.7 cm, grade 2, resection margins negative, all  sentinel lymph nodes negative, prior prognostics ER 15% positive moderate staining, PR 0% negative, HER2 negative, Ki-67 of 5%. Oncotype score of 28, distant recurrence risk at 9 years of 17%, absolute benefit of chemotherapy greater than 15% on Oncotype she tested positive for ER and PR and HER2 negative   09/28/2021 - 12/01/2021 Adjuvant Chemotherapy   Taxotere/Cytoxan every 21 days x 4 cycles   12/20/2021 Cancer Staging   Staging form: Breast, AJCC 8th Edition - Pathologic stage from 12/20/2021: Stage IA (pT2, pN0, cM0, G2, ER+, PR+, HER2-) - Signed by Eppie Gibson, MD on 12/20/2021 Stage prefix: Initial diagnosis Histologic grading system: 3 grade system   12/28/2021 - 01/24/2022 Radiation Therapy   Adjuvant radiation therapy     She works at [employer] for [employer]. Husband is estranged. She has 4 kids, one daughter and three son. Daughters are 33, sons are 58, 57 and 33. She has 4 grandchildren. She completed 4 cycles of TC on 12/02/2021 She is now s/p adjuvant radiation. She is now here to discuss about antiestrogen therapy She says, she still has painful hands and toes and muscle soreness, working with PT. She was hoping to go back to work end of December or early January. Rest of the pertinent 10 point ROS reviewed and negative.  MEDICAL HISTORY:  Past Medical History:  Diagnosis Date   Asthma    "worse when lying down"   Breast cancer [facility])    Family history of breast cancer 07/19/2021   Family history of prostate cancer 07/19/2021   GERD (gastroesophageal reflux disease)    Psoriasis    Urticaria     SURGICAL HISTORY: Past Surgical History:  Procedure Laterality Date   ABDOMINAL HYSTERECTOMY     partial  BREAST BIOPSY Left 05/26/2021   BREAST LUMPECTOMY WITH RADIOACTIVE SEED AND SENTINEL LYMPH NODE BIOPSY Left 08/03/2021   Procedure: LEFT BREAST LUMPECTOMY WITH RADIOACTIVE SEED AND SENTINEL LYMPH NODE BIOPSY;  Surgeon: Jovita Kussmaul, MD;  Location: [facility];  Service: General;  Laterality: Left;   IR IMAGING GUIDED PORT INSERTION  09/26/2021   RADIOACTIVE SEED GUIDED AXILLARY SENTINEL LYMPH NODE Left 08/03/2021   Procedure: RADIOACTIVE SEED GUIDED LEFT AXILLARY SENTINEL LYMPH NODE DISSECTION;  Surgeon: Jovita Kussmaul, MD;  Location: [facility];  Service: General;  Laterality: Left;   TUMOR REMOVAL Left 08/2021   breast    SOCIAL HISTORY: Social History   Socioeconomic History   Marital status: Married    Spouse name: Not on file   Number of children: Not on file   Years of education: Not on file   Highest education level: Not on file  Occupational History   Not on file  Tobacco Use   Smoking status: Never    Passive exposure: Never   Smokeless tobacco: Never  Vaping Use   Vaping Use: Never used  Substance and Sexual Activity   Alcohol use: No   Drug use: No   Sexual activity: Yes    Birth control/protection: Surgical  Other Topics Concern   Not on file  Social History Narrative   Not on file   Social Determinants of Health   Financial Resource Strain: High Risk (07/14/2021)   Overall Financial Resource Strain (CARDIA)    Difficulty of Paying Living Expenses: Hard  Food Insecurity: Not on file  Transportation Needs: Unmet Transportation Needs (10/19/2021)   PRAPARE - Transportation    Lack of Transportation (Medical): Yes    Lack of Transportation (Non-Medical): No  Physical Activity: Not on file  Stress: Not on file  Social Connections: Not on file  Intimate Partner Violence: Not on file    FAMILY HISTORY: Family History  Problem Relation Age of Onset   Allergic rhinitis Mother    Allergic rhinitis Sister    Allergic rhinitis Sister    Breast cancer Maternal Aunt 45   Prostate cancer Maternal Uncle        mets; dx after 50   Prostate cancer Paternal Uncle    Leukemia Paternal Uncle    Prostate cancer Paternal Uncle 19   Gastric cancer Paternal Uncle    Skin cancer Maternal  Grandmother 105   Breast cancer Cousin 64       maternal cousin   Cervical cancer Cousin    Breast cancer Cousin        dx 66s; maternal female cousin   Breast cancer Cousin        paternal female cousin x2    ALLERGIES:  is allergic to shellfish allergy, fish allergy, gluten meal, latex, other, and docetaxel.  MEDICATIONS:  Current Outpatient Medications  Medication Sig Dispense Refill   tamoxifen (NOLVADEX) 20 MG tablet Take 1 tablet (20 mg total) by mouth daily. 90 tablet 3   Acetaminophen (TYLENOL 8 HOUR PO) Take 1 tablet by mouth every 6 (six) hours as needed (pain).     albuterol (PROVENTIL HFA) 108 (90 Base) MCG/ACT inhaler Inhale 2 puffs into the lungs every 4 (four) hours as needed for wheezing or shortness of breath. 8.5 g 2   buPROPion (WELLBUTRIN XL) 300 MG 24 hr tablet Take 1 tablet  by mouth daily. 30 tablet 0   buPROPion (WELLBUTRIN) 75 MG tablet Take 1 tablet (75  mg total) by mouth 2 (two) times daily. 60 tablet 2   clobetasol (TEMOVATE) 0.05 % external solution Apply topically to affected areas twice daily as needed (not to face,groin,or axilla) 50 mL 3   desonide (DESOWEN) 0.05 % lotion Apply topically to affected areas up to  twice daily as needed (not to face,groin,or axilla) 60 mL 3   dexamethasone (DECADRON) 4 MG tablet Take 1 tablet (4 mg total) by mouth 2 (two) times daily. Start the day before Taxotere. Then again the day after chemo for 3 days. 30 tablet 1   docusate sodium (COLACE) 100 MG capsule Take 1 capsule (100 mg total) by mouth every 12 (twelve) hours. 60 capsule 0   ergocalciferol (VITAMIN D2) 1.25 MG (50000 UT) capsule Take 1 capsule (50,000 Units total) by mouth once a week for 12 doses. 12 capsule 0   famotidine (PEPCID) 20 MG tablet Take 1 tablet (20 mg total) by mouth 2 (two) times daily. 60 tablet 3   fluticasone-salmeterol (ADVAIR DISKUS) 250-50 MCG/ACT AEPB Inhale 1 puff into the lungs 1 day or 1 dose.     gabapentin (NEURONTIN) 100 MG capsule Take  1 capsule (100 mg total) by mouth 3 (three) times daily. 90 capsule 2   levocetirizine (XYZAL) 5 MG tablet Take 1 tablet (5 mg total) by mouth every evening. 30 tablet 5   lidocaine-prilocaine (EMLA) cream Apply to affected area once. 30 g 3   ondansetron (ZOFRAN) 8 MG tablet Take 8 mg by mouth 2 (two) times daily.     polyethylene glycol (MIRALAX / GLYCOLAX) 17 g packet Take 17 g by mouth daily. 14 each 0   prochlorperazine (COMPAZINE) 10 MG tablet Take 1 tablet (10 mg total) by mouth every 6 (six) hours as needed for nausea or vomiting. 30 tablet 1   triamcinolone cream (KENALOG) 0.1 % Apply topically to affected areas twice daily as needed (not to face,groin,or axilla) 453.6 g 3   triamcinolone cream (KENALOG) 0.1 % Apply topically twice daily every day for 10-14 days then as needed to see if helps 90 days 60 g 0   No current facility-administered medications for this visit.    REVIEW OF SYSTEMS:   Constitutional: Denies fevers, chills or abnormal night sweats Eyes: Denies blurriness of vision, double vision or watery eyes Ears, nose, mouth, throat, and face: Denies mucositis or sore throat Respiratory: Denies cough, dyspnea or wheezes Cardiovascular: Denies palpitation, chest discomfort or lower extremity swelling Gastrointestinal:  Denies nausea, heartburn or change in bowel habits Skin: Denies abnormal skin rashes Lymphatics: Denies new lymphadenopathy or easy bruising Neurological:Denies numbness, tingling or new weaknesses Behavioral/Psych: Mood is stable, no new changes  Breast: Noticed the dent in the breast along with some nipple retraction in the left breast All other systems were reviewed with the patient and are negative.  PHYSICAL EXAMINATION: ECOG PERFORMANCE STATUS: 0 - Asymptomatic  Vitals:   01/25/22 0932  BP: 128/73  Pulse: 76  Resp: 16  Temp: 98 F (36.7 C)  SpO2: 100%    Filed Weights   01/25/22 0932  Weight: 198 lb 8 oz (90 kg)    PE deferred in lieu  of counseling.   LABORATORY DATA:  I have reviewed the data as listed Lab Results  Component Value Date   WBC 4.9 12/26/2021   HGB 11.3 (L) 12/26/2021   HCT 34.0 (L) 12/26/2021   MCV 97.7 12/26/2021   PLT 228 12/26/2021   Lab Results  Component Value Date  NA 139 12/26/2021   K 3.8 12/26/2021   CL 108 12/26/2021   CO2 27 12/26/2021    RADIOGRAPHIC STUDIES: I have personally reviewed the radiological reports and agreed with the findings in the report.  ASSESSMENT AND PLAN:  Malignant neoplasm of upper-outer quadrant of left breast in female, estrogen receptor positive ([place]) This is a 51 year-old female patient with newly diagnosed left breast invasive ductal carcinoma, grade 2, ER 15% moderate staining, PR negative, HER2 negative, KI of 2% referred to medical oncology for adjuvant recommendations.  She is now postsurgery and is here to discuss Oncotype results and role of chemotherapy.  We have discussed final pathology as well as Oncotype score which resulted at 28, distant recurrence risk at 9 years of 17%, absolute benefit of chemotherapy greater than 15%.  Given young age, excellent PS, Oncotype score 28, we have discussed about adjuvant chemotherapy with TC every 21 days for 4 cycles.  We have discussed about adverse effects of chemotherapy including but not limited to fatigue, nausea, alopecia vomiting, increased risk of infections, neuropathy etc.  She understands that some of the side effects can be permanent.   She is s/p adjuvant chemotherapy TC *4 12/01/2021 She is now adjuvant radiation, completed 01/24/2022 She is now here to initiate anti estrogen therapy.   We have discussed Tamoxifen for anti estrogen therapy, since she is likely pre menopausal. We have discussed adverse effects of Tamoxifen including pre menopausal symptoms, DVT.PE, endometrial hyperplasia or endometrial cancer. She had a partial hysterectomy. She is willing to try tamoxifen. Medication dispensed  to the pharmacy of her choice. She will start it in 2 weeks Encouraged exercise, PT as well. RTC in 4 months.  Benay Pike MD   Total time spent: 30 minutes. All questions were answered. The patient knows to call the clinic with any problems, questions or concerns.    Benay Pike, MD 01/25/22

## 2022-01-30 ENCOUNTER — Ambulatory Visit: Payer: 59

## 2022-01-30 ENCOUNTER — Encounter: Payer: Self-pay | Admitting: Hematology and Oncology

## 2022-01-30 ENCOUNTER — Telehealth: Payer: Self-pay

## 2022-01-30 DIAGNOSIS — M25612 Stiffness of left shoulder, not elsewhere classified: Secondary | ICD-10-CM

## 2022-01-30 DIAGNOSIS — T451X5A Adverse effect of antineoplastic and immunosuppressive drugs, initial encounter: Secondary | ICD-10-CM

## 2022-01-30 DIAGNOSIS — R2689 Other abnormalities of gait and mobility: Secondary | ICD-10-CM

## 2022-01-30 DIAGNOSIS — R209 Unspecified disturbances of skin sensation: Secondary | ICD-10-CM

## 2022-01-30 DIAGNOSIS — Z170 Estrogen receptor positive status [ER+]: Secondary | ICD-10-CM

## 2022-01-30 NOTE — Telephone Encounter (Signed)
Darlene Peters, counseling intern, called the patient to schedule a counseling session.   Patient scheduled a session for Thursday, December 30th at [facility],  Counseling Intern  [phone] [email or web address]

## 2022-01-30 NOTE — Therapy (Signed)
OUTPATIENT PHYSICAL THERAPY ONCOLOGY EVALUATION  Patient Name: Darlene Peters MRN: [id number] DOB:Oct 20, 1970, 51 y.o., female Today's Date: 01/30/2022   PT End of Session - 01/30/22 1001     Visit Number 5    Number of Visits 18    Date for PT Re-Evaluation 02/08/22    PT Start Time 1809    PT Stop Time 1035    PT Time Calculation (min) 33 min    Activity Tolerance Treatment limited secondary to medical complications (Comment);Other (comment)    Behavior During Therapy --   No acute distress but felt she should be cleared due to complaints of SOB and chest pain.             Past Medical History:  Diagnosis Date   Asthma    "worse when lying down"   Breast cancer ([place])    Family history of breast cancer 07/19/2021   Family history of prostate cancer 07/19/2021   GERD (gastroesophageal reflux disease)    Psoriasis    Urticaria    Past Surgical History:  Procedure Laterality Date   ABDOMINAL HYSTERECTOMY     partial   BREAST BIOPSY Left 05/26/2021   BREAST LUMPECTOMY WITH RADIOACTIVE SEED AND SENTINEL LYMPH NODE BIOPSY Left 08/03/2021   Procedure: LEFT BREAST LUMPECTOMY WITH RADIOACTIVE SEED AND SENTINEL LYMPH NODE BIOPSY;  Surgeon: Jovita Kussmaul, MD;  Location: [facility];  Service: General;  Laterality: Left;   IR IMAGING GUIDED PORT INSERTION  09/26/2021   RADIOACTIVE SEED GUIDED AXILLARY SENTINEL LYMPH NODE Left 08/03/2021   Procedure: RADIOACTIVE SEED GUIDED LEFT AXILLARY SENTINEL LYMPH NODE DISSECTION;  Surgeon: Jovita Kussmaul, MD;  Location: [facility];  Service: General;  Laterality: Left;   TUMOR REMOVAL Left 08/2021   breast   Patient Active Problem List   Diagnosis Date Noted   Port-A-Cath in place 10/20/2021   Bacteremia 10/03/2021   Syncope 10/03/2021   Constipation 10/03/2021   Genetic testing 07/27/2021   Family history of breast cancer 07/19/2021   Family history of prostate cancer 07/19/2021   Anxiety 06/22/2021    Difficulty sleeping 06/22/2021   Major depressive disorder, single episode, unspecified 06/22/2021   Malignant neoplasm of upper-outer quadrant of left breast in female, estrogen receptor positive ([place]) 06/14/2021   Other fatigue 11/05/2019   Psoriasis 10/14/2019   Gastroesophageal reflux disease without esophagitis 05/01/2019   Intermittent asthma 07/05/2018   Low back pain 01/11/2017    PCP: Maebelle Munroe, DO  REFERRING PROVIDER: Wilber Bihari NP  REFERRING DIAG: s/p Left Breast Cancer  THERAPY DIAG:  Malignant neoplasm of upper-outer quadrant of left breast in female, estrogen receptor positive ([place])  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 Pain is still in thighs, calf, fingers,back of shoulder blade. My arms/biceps are feeling better.  No increased soreness after therapy.  I have new chest pain in central chest and on bilateral breast. It feels tight. I have new SOB since last Wednesday. Pain and SOB started about 3 days after radiation.  PERTINENT HISTORY:  Patient was diagnosed on 05/31/2021 with left Breast Cancer. It measures 2.6 cm and is located in the upper-outer quadrant. It is ER+,PR-, HER 2 - with a Ki67 of <5%.  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 0/6 LN. She had adjuvant chemotherapy with Taxotere/Cytoxan every 21 Days x 4 cycles ending on 12/01/2021, and adjuvant radiation starting on 12/28/2021. She will be having 4 weeks or radiation.   PAIN:   Are you having pain? Yes NPRS scale: 4/10 most but back is a 5/10 Pain location: bilateral hands, upperback/ toes, leg muscles Pain orientation: Left  PAIN TYPE: dull Pain description: constant  Aggravating factors: lying down, rising from sitting, walking Relieving factors: massager on legs, shoulder blade, stretching, aquatics  PRECAUTIONS: Left UE lymphedema risk, neuropathy, psoriasis  WEIGHT BEARING RESTRICTIONS: No  FALLS:  Has patient fallen in last 6 months? Yes. Number of falls 1  LIVING ENVIRONMENT: Lives with: lives with their spouse Lives in: House/apartment Stairs: Yes; External: 3 steps; on left going up Has following equipment at home: None  OCCUPATION: Not presently working  LEISURE: walking,  reading  HAND DOMINANCE: right   PRIOR LEVEL OF FUNCTION: Independent  PATIENT GOALS: Get stronger, less pain,more stamina and balance, more energy   OBJECTIVE:  COGNITION: Overall cognitive status: Within functional limits for tasks assessed   PALPATION: NT  OBSERVATIONS / OTHER ASSESSMENTS: slumped sitting posture, with gait normal gait over 50 feet with one step off center before returning  SENSATION: Light touch: Deficits back of arm  POSTURE: forward head, rounded shoulders  UPPER EXTREMITY AROM/PROM:  A/PROM RIGHT   eval   Shoulder extension 64  Shoulder flexion 165  Shoulder abduction 180  Shoulder internal rotation 75  Shoulder external rotation 110    (Blank rows = not tested)  A/PROM LEFT   eval  Shoulder extension 65  Shoulder flexion 150  Shoulder abduction 170  Shoulder internal rotation 65  Shoulder external rotation 105    (Blank rows = not tested)  CERVICAL AROM: All within functional limits:     UPPER EXTREMITY STRENGTH: NT   LOWER EXTREMITY AROM/PROM:  A/PROM Right eval  Hip flexion 4  Hip extension   Hip abduction 5  Hip adduction 4  Hip internal rotation 4-  Hip external rotation 3+  Knee flexion 5  Knee  extension 4  Ankle dorsiflexion 5  Ankle plantarflexion 5  Ankle inversion   Ankle eversion    (Blank rows = not tested)  A/PROM LEFT eval  Hip flexion 4  Hip extension   Hip abduction 5 seated  Hip adduction 4+ seated  Hip internal rotation 4  Hip external rotation 4  Knee flexion 5  Knee extension 4+  Ankle dorsiflexion 5  Ankle plantarflexion 5  Ankle inversion   Ankle eversion    (Blank rows = not tested)  LOWER EXTREMITY MMT: see above  LYMPHEDEMA ASSESSMENTS:   SURGERY TYPE/DATE: 08/03/2021 Left Lumpectomy with deep axillary SLNB  NUMBER OF LYMPH NODES REMOVED: 0/6  CHEMOTHERAPY: 4 cycles,  ended 12/01/2021  RADIATION:to start 12/28/2021  HORMONE TREATMENT: will start after radiation  INFECTIONS: Bacterial infection in blood in July 2023  LYMPHEDEMA ASSESSMENTS:   LANDMARK RIGHT  eval  10 cm proximal to olecranon process 32.3  Olecranon process 26.6  10 cm proximal to ulnar styloid process 20.8  Just proximal to ulnar styloid process 15.3  Across hand at thumb web space 19.6  At base of 2nd digit 6.1  (Blank rows = not tested)  LANDMARK LEFT  eval  10 cm proximal to olecranon process 31.4  Olecranon process 25.9  10 cm proximal to ulnar styloid process 19.3  Just proximal to ulnar styloid process 14.5  Across hand at thumb web space 19.2  At base of 2nd digit 5.8  (Blank rows = not tested)  FUNCTIONAL TESTS:  30 seconds chair stand test  8 repetitions Timed up and go (TUG): 4 position balance test; Unable to hold tandem stance on either side  for more than 3 secs,, trunk sway with all positions  GAIT: Distance walked: 50 feet Assistive device utilized: None Level of assistance: Complete Independence Comments: 1 minor step off course, no LOB  L-DEX LYMPHEDEMA SCREENING:  The patient was assessed using the L-Dex machine today to produce a lymphedema index baseline score. The patient will be reassessed on a regular basis (typically every 3  months) to obtain new L-Dex scores. If the score is > 6.5 points away from his/her baseline score indicating onset of subclinical lymphedema, it will be recommended to wear a compression garment for 4 weeks, 12 hours per day and then be reassessed. If the score continues to be > 6.5 points from baseline at reassessment, we will initiate lymphedema treatment. Assessing in this manner has a 95% rate of preventing clinically significant lymphedema.     QUICK DASH SURVEY: 68%   TODAY'S TREATMENT:                                                                                                                           DATE:  01/30/2022 Pt was started on the Nustep while checking in with pt about how she was feeling. Performed for about 3 min at [facility] and then pt indicated she had been experiencing chest pain and SOB which started about 3 days after her last radiation. She was not in acute distress. O2 Sats were 98, BP 151/89, and after resting 121/83. Messaged Dawn and Dr. Chryl Heck to have her screened before returning to PT.   01/25/2022 Nu Step x 8 min seat 11, UE 10 Lev 4 Sit to stand 3x5 Sidestepping with red band 6 lengths in parallel bars 3 D hip with red band x 10 JJ:[id number], abd, extension Monster walks red band 22 steps each direction Scapular retraction and shoulder extension x 15 ea with red band AX beam forward x 8 lengths normal step length x 4 and tandem x 4, sidestepping x 4 lengths Step ups on ax with alternate  hip flexion x 10 ea Bilateral Quad stretch x 2 standing 01/18/2022 Nearly 30 min late Nu step x 6 min seat 11, UE 10, lev 2 In parallel bars: on airex heel raises x 15 no HH, marching x 10 ea no HH,forward and lateral step ups bilaterally x 10, tandem stance x 1 B, DLS on ax eyes closed 3 x 30 sec Scapular retraction and shoulder ext red band x 10 ea with emphasis on good posture and stability  01/11/2022 Performed pt sozo screen and pt is well within the green  range Pulleys x 2 min for shoulder flexion and abduction with occasional VC's for technique to assist shoulder ROM Nustep seat 11, UE 10 lev. 2 x 5 Sit to stand 2 x 5 from mat table. In parallel bars: heel raises x 15 no HH, marching x 10 ea no HH, Tandem stance each side x3  for balance Standing gastroc stretch at wall 3 x 20 sec ea, seated HS stretch x3 20 sec SLS x 5 ea Updated HEP  12/28/2021 Evaluation, discussed POC, aquatics etc. Messaged Lindsey to see if pt can do aquatic therapy with her porta cath and radiation  PATIENT EDUCATION:  Education details: POC, aquatics,Access Code: [email or web address] URL: [email or web address] Date: 01/11/2022 Prepared by: Cheral Almas  Exercises - Sit to Stand Without Arm Support  - 2 x daily - 7 x weekly - 2 sets - 5 reps - Standing Heel Raise  - 2 x daily - 7 x weekly - 1 sets - 10 reps - Single Leg Stance  - 1 x daily - 7 x weekly - 1 sets - 5 reps - 10 hold - Standing Hip Flexion March  - 1 x daily - 7 x weekly - 3 sets - 10 reps - Gastroc Stretch on Wall  - 2 x daily - 7 x weekly - 1 sets - 3 reps - 20 hold - Seated Hamstring Stretch  - 1 x daily - 7 x weekly - 1 sets - 3 reps - 20 hold Person educated: Patient Education method: Explanation Education comprehension: verbalized understanding,demonstrated  HOME EXERCISE PROGRAM: Sit to Stand Without Arm Support  - 2 x daily - 7 x weekly - 2 sets - 5 reps - Standing Heel Raise  - 2 x daily - 7 x weekly - 1 sets - 10 reps - Single Leg Stance  - 1 x daily - 7 x weekly - 1 sets - 5 reps - 10 hold - Standing Hip Flexion March  - 1 x daily - 7 x weekly - 3 sets - 10 reps - Gastroc Stretch on Wall  - 2 x daily - 7 x weekly - 1 sets - 3 reps - 20 hold - Seated Hamstring Stretch  - 1 x daily - 7 x weekly - 1 sets - 3 reps - 20 ho  ASSESSMENT:  CLINICAL IMPRESSION: Pt was started on Nu step but discontinued after several minutes when pt said she had been experiencing chest pain and SOB  about 3 days after completing her radiation therapy. Messaged MD and Nurse Navigator Dawn and asked them to be sure she was cleared before returning to PT.  OBJECTIVE IMPAIRMENTS: decreased balance, decreased endurance, decreased ROM, decreased strength, impaired UE functional use, postural dysfunction, and pain.   ACTIVITY LIMITATIONS: lifting, standing, sleeping, bed mobility, reach over head, and locomotion level  PARTICIPATION LIMITATIONS: driving, occupation, and recreational activities such as walking  PERSONAL FACTORS: 3+ comorbidities: Left breast cancer s/p chemo  and presently having radiation  are also affecting patient's functional outcome.   REHAB POTENTIAL: Good  CLINICAL DECISION MAKING: Stable/uncomplicated  EVALUATION COMPLEXITY: Low  GOALS: Goals reviewed with patient? Yes  SHORT TERM GOALS: Target date: 02/20/2022    Pt will be independent in a HEP for LE strength and balance Baseline: Goal status: INITIAL  2.  Pt will restore left shoulder ROM to WNL for improved ease with radiation Baseline:  Goal status: INITIAL  3.  Pt will be able to perform 11 sit to stands in 30 seconds Baseline:  Goal status: INITIAL    LONG TERM GOALS: Target date: 03/13/2022    Pts quick dash will be no greater than 20% Baseline:  Goal status: INITIAL  2.  Pt will be able to maintain tandem stance with either foot forward x 10-15 seconds to demonstrate improved balance Baseline:  Goal status: INITIAL  3.  Pt will be able to perform 15 sit to stands in 30 seconds to decrease fall risk Baseline:  Goal status: INITIAL  4.  Pt will maintain feet together stance 20 seconds without sway Baseline:  Goal status: INITIAL  5.  Pt will have 5/5 strength bilateral LE's for improved safety Baseline:  Goal status: INITIAL  6.  Pt will be independent in aquatic exs so she may join a Y or other gym Baseline:  Goal status: INITIAL  PLAN:  PT FREQUENCY: 3x/week  PT DURATION: 6  weeks  PLANNED INTERVENTIONS: Therapeutic exercises, Therapeutic activity, Neuromuscular re-education, Balance training, Gait training, Patient/Family education, Self Care, Joint mobilization, Orthotic/Fit training, Aquatic Therapy, Dry Needling, scar mobilization, and Manual therapy  PLAN FOR NEXT SESSION: ,Wait for pt to be cleared to return to PT due to chest pain/SOB,Shoulder ROM;wand exs flex, scaption, ER, PROM HEP: sit to stand,balance, generalized strength, grip Aquatic therapy if allowed with port and radiation (messaged [facility] [facility] today) OK with Mendel Ryder per inbox   Claris Pong, PT 01/30/2022, 10:47 AM

## 2022-01-30 NOTE — Telephone Encounter (Signed)
Rn called pt to inform her based on current symptoms (chest pain and shortness of breath), she needs to follow up with her primary care provider as soon as possible, or go to the emergency room (if she cannot see her pcp very soon).  Physical therapy had contacted Dr. Isidore Moos concerning her symptoms. She stated on the phone that she had a cardiologist she has not seen since 2021. Rn encouraged her to seek testing and follow up on her symptoms with this history. Pt stated she would do so. Rn also encouraged pt to call with update on her symptoms and treatment.

## 2022-02-01 ENCOUNTER — Encounter: Payer: Self-pay | Admitting: Physical Therapy

## 2022-02-01 ENCOUNTER — Ambulatory Visit: Payer: 59 | Admitting: Physical Therapy

## 2022-02-01 DIAGNOSIS — M25612 Stiffness of left shoulder, not elsewhere classified: Secondary | ICD-10-CM

## 2022-02-01 DIAGNOSIS — C50412 Malignant neoplasm of upper-outer quadrant of left female breast: Secondary | ICD-10-CM | POA: Diagnosis not present

## 2022-02-01 DIAGNOSIS — R293 Abnormal posture: Secondary | ICD-10-CM

## 2022-02-01 DIAGNOSIS — R209 Unspecified disturbances of skin sensation: Secondary | ICD-10-CM

## 2022-02-01 DIAGNOSIS — T451X5A Adverse effect of antineoplastic and immunosuppressive drugs, initial encounter: Secondary | ICD-10-CM

## 2022-02-01 DIAGNOSIS — R2689 Other abnormalities of gait and mobility: Secondary | ICD-10-CM

## 2022-02-01 DIAGNOSIS — R600 Localized edema: Secondary | ICD-10-CM

## 2022-02-01 NOTE — Therapy (Signed)
OUTPATIENT PHYSICAL THERAPY ONCOLOGY   Patient Name: Darlene Peters MRN: [id number] DOB:05/02/1970, 51 y.o., female Today's Date: 02/01/2022   PT End of Session - 02/01/22 1113     Visit Number 6    Number of Visits 18    Date for PT Re-Evaluation 02/08/22    PT Start Time 1110   pt late   PT Stop Time 1210    PT Time Calculation (min) 60 min    Activity Tolerance Patient tolerated treatment well    Behavior During Therapy WFL for tasks assessed/performed              Past Medical History:  Diagnosis Date   Asthma    "worse when lying down"   Breast cancer (HCC)    Family history of breast cancer 07/19/2021   Family history of prostate cancer 07/19/2021   GERD (gastroesophageal reflux disease)    Psoriasis    Urticaria    Past Surgical History:  Procedure Laterality Date   ABDOMINAL HYSTERECTOMY     partial   BREAST BIOPSY Left 05/26/2021   BREAST LUMPECTOMY WITH RADIOACTIVE SEED AND SENTINEL LYMPH NODE BIOPSY Left 08/03/2021   Procedure: LEFT BREAST LUMPECTOMY WITH RADIOACTIVE SEED AND SENTINEL LYMPH NODE BIOPSY;  Surgeon: Toth, Paul III, MD;  Location: [facility];  Service: General;  Laterality: Left;   IR IMAGING GUIDED PORT INSERTION  09/26/2021   RADIOACTIVE SEED GUIDED AXILLARY SENTINEL LYMPH NODE Left 08/03/2021   Procedure: RADIOACTIVE SEED GUIDED LEFT AXILLARY SENTINEL LYMPH NODE DISSECTION;  Surgeon: Toth, Paul III, MD;  Location:  SURGERY CENTER;  Service: General;  Laterality: Left;   TUMOR REMOVAL Left 08/2021   breast   Patient Active Problem List   Diagnosis Date Noted   Port-A-Cath in place 10/20/2021   Bacteremia 10/03/2021   Syncope 10/03/2021   Constipation 10/03/2021   Genetic testing 07/27/2021   Family history of breast cancer 07/19/2021   Family history of prostate cancer 07/19/2021   Anxiety 06/22/2021   Difficulty sleeping 06/22/2021   Major depressive disorder, single episode, unspecified 06/22/2021    Malignant neoplasm of upper-outer quadrant of left breast in female, estrogen receptor positive (HCC) 06/14/2021   Other fatigue 11/05/2019   Psoriasis 10/14/2019   Gastroesophageal reflux disease without esophagitis 05/01/2019   Intermittent asthma 07/05/2018   Low back pain 01/11/2017    PCP: Shakle, Austin, DO  REFERRING PROVIDER: Lindsey Causey NP  REFERRING DIAG: s/p Left Breast Cancer  THERAPY DIAG:  Malignant neoplasm of upper-outer quadrant of left breast in female, estrogen receptor positive (HCC)  Paclitaxel induced neuropathy (HCC)  Loss of balance  Disturbance of skin sensation  Stiffness of left shoulder, not elsewhere classified  Localized edema  Abnormal posture  ONSET DATE: 06/14/2021  Rationale for Evaluation and Treatment Rehabilitation  SUBJECTIVE:                                                                                                                                                                                             SUBJECTIVE STATEMENT: I am very excited to get in the water. I am cleared to do so. No other complaints today than normal joint pain, but low level 2-3/10.  PERTINENT HISTORY:  Patient was diagnosed on 05/31/2021 with left Breast Cancer. It measures 2.6 cm and is located in the upper-outer quadrant. It is ER+,PR-, HER 2 - with a Ki67 of <5%.  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 0/6 LN. She had adjuvant chemotherapy with Taxotere/Cytoxan every 21 Days x 4 cycles ending on 12/01/2021, and adjuvant radiation starting on 12/28/2021. She will be having 4 weeks or radiation.   PAIN:  Are you having pain? Yes NPRS scale: 4/10 most but back is a 5/10 Pain location: bilateral hands, upperback/ toes, leg muscles Pain orientation: Left  PAIN TYPE: dull Pain description: constant  Aggravating factors: lying down, rising from sitting,  walking Relieving factors: massager on legs, shoulder blade, stretching, aquatics  PRECAUTIONS: Left UE lymphedema risk, neuropathy, psoriasis  WEIGHT BEARING RESTRICTIONS: No  FALLS:  Has patient fallen in last 6 months? Yes. Number of falls 1  LIVING ENVIRONMENT: Lives with: lives with their spouse Lives in: House/apartment Stairs: Yes; External: 3 steps; on left going up Has following equipment at home: None  OCCUPATION: Not presently working  LEISURE: walking,  reading  HAND DOMINANCE: right   PRIOR LEVEL OF FUNCTION: Independent  PATIENT GOALS: Get stronger, less pain,more stamina and balance, more energy   OBJECTIVE:  COGNITION: Overall cognitive status: Within functional limits for tasks assessed   PALPATION: NT  OBSERVATIONS / OTHER ASSESSMENTS: slumped sitting posture, with gait normal gait over 50 feet with one step off center before returning  SENSATION: Light touch: Deficits back of arm  POSTURE: forward head, rounded shoulders  UPPER EXTREMITY AROM/PROM:  A/PROM RIGHT   eval   Shoulder extension 64  Shoulder flexion 165  Shoulder abduction 180  Shoulder internal rotation 75  Shoulder external rotation 110    (Blank rows = not tested)  A/PROM LEFT   eval  Shoulder extension 65  Shoulder flexion 150  Shoulder abduction 170  Shoulder internal rotation 65  Shoulder external rotation 105    (Blank rows = not tested)  CERVICAL AROM: All within functional limits:     UPPER EXTREMITY STRENGTH: NT   LOWER EXTREMITY AROM/PROM:  A/PROM Right eval  Hip flexion 4  Hip extension   Hip abduction 5  Hip adduction 4  Hip internal rotation 4-  Hip external rotation 3+  Knee flexion 5  Knee extension 4  Ankle dorsiflexion 5  Ankle plantarflexion 5  Ankle inversion   Ankle eversion    (Blank rows = not tested)  A/PROM LEFT eval  Hip flexion 4  Hip extension   Hip abduction 5 seated  Hip adduction 4+ seated  Hip internal rotation 4   Hip external rotation 4  Knee flexion 5  Knee extension 4+  Ankle dorsiflexion 5  Ankle plantarflexion 5  Ankle inversion   Ankle eversion    (Blank rows = not tested)  LOWER EXTREMITY MMT: see above  LYMPHEDEMA ASSESSMENTS:   SURGERY TYPE/DATE: 08/03/2021 Left Lumpectomy with deep axillary SLNB  NUMBER OF LYMPH NODES REMOVED: 0/6  CHEMOTHERAPY: 4 cycles, ended 12/01/2021  RADIATION:to start 12/28/2021  HORMONE TREATMENT: will start after radiation  INFECTIONS: Bacterial infection in blood in July 2023  LYMPHEDEMA ASSESSMENTS:   LANDMARK RIGHT  eval  10  cm proximal to olecranon process 32.3  Olecranon process 26.6  10 cm proximal to ulnar styloid process 20.8  Just proximal to ulnar styloid process 15.3  Across hand at thumb web space 19.6  At base of 2nd digit 6.1  (Blank rows = not tested)  LANDMARK LEFT  eval  10 cm proximal to olecranon process 31.4  Olecranon process 25.9  10 cm proximal to ulnar styloid process 19.3  Just proximal to ulnar styloid process 14.5  Across hand at thumb web space 19.2  At base of 2nd digit 5.8  (Blank rows = not tested)  FUNCTIONAL TESTS:  30 seconds chair stand test  8 repetitions Timed up and go (TUG): 4 position balance test; Unable to hold tandem stance on either side  for more than 3 secs,, trunk sway with all positions  GAIT: Distance walked: 50 feet Assistive device utilized: None Level of assistance: Complete Independence Comments: 1 minor step off course, no LOB  L-DEX LYMPHEDEMA SCREENING:  The patient was assessed using the L-Dex machine today to produce a lymphedema index baseline score. The patient will be reassessed on a regular basis (typically every 3 months) to obtain new L-Dex scores. If the score is > 6.5 points away from his/her baseline score indicating onset of subclinical lymphedema, it will be recommended to wear a compression garment for 4 weeks, 12 hours per day and then be reassessed. If the  score continues to be > 6.5 points from baseline at reassessment, we will initiate lymphedema treatment. Assessing in this manner has a 95% rate of preventing clinically significant lymphedema.     QUICK DASH SURVEY: 68%   TODAY'S TREATMENT: 02/01/22: Pt arrives for aquatic physical therapy. Treatment took place in 3.5-5.5 feet of water. Water temperature was 91 degrees F. Pt entered the pool via stairs with mod use of rails. Pt requires buoyancy of water for support and to offload joints with strengthening exercises.  Seated water bench with 75% submersion Pt performed seated LE AROM exercises 20x in all planes, concurrent verbal education on water principles and how we would be using them. Pt verbally understood. 75% depth water walking with natural arm swing 6 lengthds in each direction. Standing single knee to chest stretch 3x 10 sec against the wall. High knee marching across the pool 4 lengths using small noodle for support. Hip 3 ways Bil 10x each using wall for balance. Seated decompression with large noodle behind patient. Intermittent LE AROM                                                                                                                             DATE:  01/30/2022 Pt was started on the Nustep while checking in with pt about how she was feeling. Performed for about 3 min at Lev 4 and then pt indicated she had been experiencing chest pain and SOB which started about 3 days after her last radiation. She was not in acute   distress. O2 Sats were 98, BP 151/89, and after resting 121/83. Messaged Dawn and Dr. Iruku to have her screened before returning to PT.   01/25/2022 Nu Step x 8 min seat 11, UE 10 Lev 4 Sit to stand 3x5 Sidestepping with red band 6 lengths in parallel bars 3 D hip with red band x 10 ea:flexion, abd, extension Monster walks red band 22 steps each direction Scapular retraction and shoulder extension x 15 ea with red band AX beam forward x 8 lengths  normal step length x 4 and tandem x 4, sidestepping x 4 lengths Step ups on ax with alternate hip flexion x 10 ea Bilateral Quad stretch x 2 standing 01/18/2022 Nearly 30 min late Nu step x 6 min seat 11, UE 10, lev 2 In parallel bars: on airex heel raises x 15 no HH, marching x 10 ea no HH,forward and lateral step ups bilaterally x 10, tandem stance x 1 B, DLS on ax eyes closed 3 x 30 sec Scapular retraction and shoulder ext red band x 10 ea with emphasis on good posture and stability  01/11/2022 Performed pt sozo screen and pt is well within the green range Pulleys x 2 min for shoulder flexion and abduction with occasional VC's for technique to assist shoulder ROM Nustep seat 11, UE 10 lev. 2 x 5 Sit to stand 2 x 5 from mat table. In parallel bars: heel raises x 15 no HH, marching x 10 ea no HH, Tandem stance each side x3  for balance Standing gastroc stretch at wall 3 x 20 sec ea, seated HS stretch x3 20 sec SLS x 5 ea Updated HEP  12/28/2021 Evaluation, discussed POC, aquatics etc. Messaged Lindsey to see if pt can do aquatic therapy with her porta cath and radiation  PATIENT EDUCATION:  Education details: POC, aquatics,Access Code: [email or web address] URL: [email or web address] Date: 01/11/2022 Prepared by: Robin Walcott  Exercises - Sit to Stand Without Arm Support  - 2 x daily - 7 x weekly - 2 sets - 5 reps - Standing Heel Raise  - 2 x daily - 7 x weekly - 1 sets - 10 reps - Single Leg Stance  - 1 x daily - 7 x weekly - 1 sets - 5 reps - 10 hold - Standing Hip Flexion March  - 1 x daily - 7 x weekly - 3 sets - 10 reps - Gastroc Stretch on Wall  - 2 x daily - 7 x weekly - 1 sets - 3 reps - 20 hold - Seated Hamstring Stretch  - 1 x daily - 7 x weekly - 1 sets - 3 reps - 20 hold Person educated: Patient Education method: Explanation Education comprehension: verbalized understanding,demonstrated  HOME EXERCISE PROGRAM: Sit to Stand Without Arm Support  - 2 x daily - 7 x  weekly - 2 sets - 5 reps - Standing Heel Raise  - 2 x daily - 7 x weekly - 1 sets - 10 reps - Single Leg Stance  - 1 x daily - 7 x weekly - 1 sets - 5 reps - 10 hold - Standing Hip Flexion March  - 1 x daily - 7 x weekly - 3 sets - 10 reps - Gastroc Stretch on Wall  - 2 x daily - 7 x weekly - 1 sets - 3 reps - 20 hold - Seated Hamstring Stretch  - 1 x daily - 7 x weekly - 1 sets - 3 reps - 20 ho  ASSESSMENT:    CLINICAL IMPRESSION: Pt arrives for first aquatic PT session with mild joint pain. Pt reports moving in the water  almost abolished the pain 100%. Patient tolerated good load of work for first time aquatic session. No excessive fatigue noted.   OBJECTIVE IMPAIRMENTS: decreased balance, decreased endurance, decreased ROM, decreased strength, impaired UE functional use, postural dysfunction, and pain.   ACTIVITY LIMITATIONS: lifting, standing, sleeping, bed mobility, reach over head, and locomotion level  PARTICIPATION LIMITATIONS: driving, occupation, and recreational activities such as walking  PERSONAL FACTORS: 3+ comorbidities: Left breast cancer s/p chemo and presently having radiation  are also affecting patient's functional outcome.   REHAB POTENTIAL: Good  CLINICAL DECISION MAKING: Stable/uncomplicated  EVALUATION COMPLEXITY: Low  GOALS: Goals reviewed with patient? Yes  SHORT TERM GOALS: Target date: 02/22/2022    Pt will be independent in a HEP for LE strength and balance Baseline: Goal status: INITIAL  2.  Pt will restore left shoulder ROM to WNL for improved ease with radiation Baseline:  Goal status: INITIAL  3.  Pt will be able to perform 11 sit to stands in 30 seconds Baseline:  Goal status: INITIAL    LONG TERM GOALS: Target date: 03/15/2022    Pts quick dash will be no greater than 20% Baseline:  Goal status: INITIAL  2.  Pt will be able to maintain tandem stance with either foot forward x 10-15 seconds to demonstrate improved balance Baseline:   Goal status: INITIAL  3.  Pt will be able to perform 15 sit to stands in 30 seconds to decrease fall risk Baseline:  Goal status: INITIAL  4.  Pt will maintain feet together stance 20 seconds without sway Baseline:  Goal status: INITIAL  5.  Pt will have 5/5 strength bilateral LE's for improved safety Baseline:  Goal status: INITIAL  6.  Pt will be independent in aquatic exs so she may join a Y or other gym Baseline:  Goal status: INITIAL  PLAN:  PT FREQUENCY: 3x/week  PT DURATION: 6 weeks  PLANNED INTERVENTIONS: Therapeutic exercises, Therapeutic activity, Neuromuscular re-education, Balance training, Gait training, Patient/Family education, Self Care, Joint mobilization, Orthotic/Fit training, Aquatic Therapy, Dry Needling, scar mobilization, and Manual therapy  PLAN FOR NEXT SESSION: See how pt did with aquatics, Shoulder ROM;wand exs flex, scaption, ER, PROM HEP: sit to stand,balance, generalized strength, grip, and continue Aquatic therapy   Myrene Galas, PTA 02/01/22 12:11 PM

## 2022-02-02 ENCOUNTER — Inpatient Hospital Stay: Payer: 59 | Admitting: Licensed Clinical Social Worker

## 2022-02-02 NOTE — Progress Notes (Signed)
Darlene Peters, counseling intern, met with patient for their first counseling session.  Patient expressed they are feeling overwhelmed because their chemo symptoms are lingering after their chemo treatment ended on September 28th. The patient reported they don't feel "normal." The patient reported they feel overwhelmed because they are not able to go back to work due to memory relapse, body pains, and exhaustion. The patient reported because of this, they are behind on bills.   The patient reported they attended the aquatics class and tai chi. The patient reported both were helpful in relieving some body pain.   The patient reported their brain feels fuzzy and their memory has worsened. The patient reported this may be from chemo or from stress.   The patient scheduled their next counseling session for Wednesday, December 6th at 11am.  The counselor gave the patient a bag of nonperishable food items and a schedule for [place].  Darlene Peters,  Counseling Intern  [phone] [email or web address]

## 2022-02-02 NOTE — Progress Notes (Signed)
[place] CSW Progress Note  Clinical Education officer, museum contacted patient by phone to follow-up on request for support. Pt has not been able to return to work yet due to ongoing pain. She is looking into resources to help with a wig, food, and rent assistance.  CSW sent request for prescription for cranial prosthesis to MD and will send voucher for [employer] wig.  With pt's permission, CSW sent referrals to [employer], [employer], and [employer] food pantry. Also referred to [employer] food pantry and [employer] via [place].  Pt is trying aquatic therapy and other movement to address the pain as she wants to minimize using pain medication.    Madoline Bhatt E Sander Speckman, LCSW

## 2022-02-06 ENCOUNTER — Ambulatory Visit: Payer: 59 | Attending: Adult Health

## 2022-02-06 DIAGNOSIS — M25612 Stiffness of left shoulder, not elsewhere classified: Secondary | ICD-10-CM | POA: Diagnosis present

## 2022-02-06 DIAGNOSIS — R2689 Other abnormalities of gait and mobility: Secondary | ICD-10-CM | POA: Diagnosis present

## 2022-02-06 DIAGNOSIS — Z170 Estrogen receptor positive status [ER+]: Secondary | ICD-10-CM | POA: Diagnosis present

## 2022-02-06 DIAGNOSIS — T451X5A Adverse effect of antineoplastic and immunosuppressive drugs, initial encounter: Secondary | ICD-10-CM | POA: Diagnosis present

## 2022-02-06 DIAGNOSIS — C50412 Malignant neoplasm of upper-outer quadrant of left female breast: Secondary | ICD-10-CM | POA: Insufficient documentation

## 2022-02-06 DIAGNOSIS — G620 Drug-induced polyneuropathy: Secondary | ICD-10-CM | POA: Insufficient documentation

## 2022-02-06 DIAGNOSIS — R209 Unspecified disturbances of skin sensation: Secondary | ICD-10-CM | POA: Insufficient documentation

## 2022-02-06 NOTE — Therapy (Addendum)
OUTPATIENT PHYSICAL THERAPY ONCOLOGY   Patient Name: Darlene Peters MRN: [id number] DOB:12-24-1970, 51 y.o., female Today's Date: 02/06/2022   PT End of Session - 02/06/22 1006     Visit Number 7    Number of Visits 18    Date for PT Re-Evaluation 04/03/22    PT Start Time 1007    PT Stop Time 1054    PT Time Calculation (min) 47 min    Activity Tolerance Patient tolerated treatment well;Treatment limited secondary to medical complications (Comment);Other (comment)   limited treatment but performed objective tests for recert due to pt needing to be screened by MD before returning for actual treatment   Behavior During Therapy [facility] for tasks assessed/performed              Past Medical History:  Diagnosis Date   Asthma    "worse when lying down"   Breast cancer ([place])    Family history of breast cancer 07/19/2021   Family history of prostate cancer 07/19/2021   GERD (gastroesophageal reflux disease)    Psoriasis    Urticaria    Past Surgical History:  Procedure Laterality Date   ABDOMINAL HYSTERECTOMY     partial   BREAST BIOPSY Left 05/26/2021   BREAST LUMPECTOMY WITH RADIOACTIVE SEED AND SENTINEL LYMPH NODE BIOPSY Left 08/03/2021   Procedure: LEFT BREAST LUMPECTOMY WITH RADIOACTIVE SEED AND SENTINEL LYMPH NODE BIOPSY;  Surgeon: Jovita Kussmaul, MD;  Location: [place];  Service: General;  Laterality: Left;   IR IMAGING GUIDED PORT INSERTION  09/26/2021   RADIOACTIVE SEED GUIDED AXILLARY SENTINEL LYMPH NODE Left 08/03/2021   Procedure: RADIOACTIVE SEED GUIDED LEFT AXILLARY SENTINEL LYMPH NODE DISSECTION;  Surgeon: Jovita Kussmaul, MD;  Location: [place];  Service: General;  Laterality: Left;   TUMOR REMOVAL Left 08/2021   breast   Patient Active Problem List   Diagnosis Date Noted   Port-A-Cath in place 10/20/2021   Bacteremia 10/03/2021   Syncope 10/03/2021   Constipation 10/03/2021   Genetic testing 07/27/2021   Family history  of breast cancer 07/19/2021   Family history of prostate cancer 07/19/2021   Anxiety 06/22/2021   Difficulty sleeping 06/22/2021   Major depressive disorder, single episode, unspecified 06/22/2021   Malignant neoplasm of upper-outer quadrant of left breast in female, estrogen receptor positive ([place]) 06/14/2021   Other fatigue 11/05/2019   Psoriasis 10/14/2019   Gastroesophageal reflux disease without esophagitis 05/01/2019   Intermittent asthma 07/05/2018   Low back pain 01/11/2017    PCP: Maebelle Munroe, DO  REFERRING PROVIDER: Wilber Bihari NP  REFERRING DIAG: s/p Left Breast Cancer  THERAPY DIAG:  Malignant neoplasm of upper-outer quadrant of left breast in female, estrogen receptor positive ([place])  Paclitaxel induced neuropathy ([place])  Loss of balance  Disturbance of skin sensation  Stiffness of left shoulder, not elsewhere classified  ONSET DATE: 06/14/2021  Rationale for Evaluation and Treatment Rehabilitation  SUBJECTIVE:  SUBJECTIVE STATEMENT:  I still haven't seen the doctor for the SOB and the L.chest.  Chest pain is minimum presently, about a 3/10.  Its been occurring since Jan 26, 2022. The [place] dId want to follow up with the cardiologist or the ED about it. I loved the aquatic class last week.  PERTINENT HISTORY:  Patient was diagnosed on 05/31/2021 with left Breast Cancer. It measures 2.6 cm and is located in the upper-outer quadrant. It is ER+,PR-, HER 2 - with a Ki67 of <5%.  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 0/6 LN. She had adjuvant chemotherapy with Taxotere/Cytoxan every 21 Days x 4 cycles ending on 12/01/2021, and adjuvant radiation starting on 12/28/2021. She will be having 4  weeks or radiation.   PAIN:  Are you having pain? Yes NPRS scale: 4/10 most but back is a 5/10, chest 3/10 Pain location: bilateral hands, upperback/ toes, leg muscles Pain orientation: Left  PAIN TYPE: dull Pain description: constant  Aggravating factors: lying down, rising from sitting, walking Relieving factors: massager on legs, shoulder blade, stretching, aquatics  PRECAUTIONS: Left UE lymphedema risk, neuropathy, psoriasis  WEIGHT BEARING RESTRICTIONS: No  FALLS:  Has patient fallen in last 6 months? Yes. Number of falls 1  LIVING ENVIRONMENT: Lives with: lives with their spouse Lives in: House/apartment Stairs: Yes; External: 3 steps; on left going up Has following equipment at home: None  OCCUPATION: Not presently working  LEISURE: walking,  reading  HAND DOMINANCE: right   PRIOR LEVEL OF FUNCTION: Independent  PATIENT GOALS: Get stronger, less pain,more stamina and balance, more energy   OBJECTIVE:  COGNITION: Overall cognitive status: Within functional limits for tasks assessed   PALPATION: NT  OBSERVATIONS / OTHER ASSESSMENTS: slumped sitting posture, with gait normal gait over 50 feet with one step off center before returning  SENSATION: Light touch: Deficits back of arm  POSTURE: forward head, rounded shoulders  Quick Dash Eval   68.18,02/06/2022   45.45%  UPPER EXTREMITY AROM/PROM:  A/PROM RIGHT   eval  LEFT 02/06/2022  Shoulder extension 64   Shoulder flexion 165 160  Shoulder abduction 180 180  Shoulder internal rotation 75   Shoulder external rotation 110     (Blank rows = not tested)  A/PROM LEFT   eval LEFT 02/06/2022  Shoulder extension 65   Shoulder flexion 150 160  Shoulder abduction 170 180  Shoulder internal rotation 65 68  Shoulder external rotation 105     (Blank rows = not tested)  CERVICAL AROM: All within functional limits:     UPPER EXTREMITY STRENGTH: NT   LOWER EXTREMITY AROM/PROM:  A/PROM Right eval   Hip flexion 4  Hip extension   Hip abduction 5  Hip adduction 4  Hip internal rotation 4-  Hip external rotation 3+  Knee flexion 5  Knee extension 4  Ankle dorsiflexion 5  Ankle plantarflexion 5  Ankle inversion   Ankle eversion    (Blank rows = not tested)  A/PROM LEFT eval  Hip flexion 4  Hip extension   Hip abduction 5 seated  Hip adduction 4+ seated  Hip internal rotation 4  Hip external rotation 4  Knee flexion 5  Knee extension 4+  Ankle dorsiflexion 5  Ankle plantarflexion 5  Ankle inversion   Ankle eversion    (Blank rows = not tested)  LOWER EXTREMITY MMT: see above  LYMPHEDEMA ASSESSMENTS:   SURGERY  TYPE/DATE: 08/03/2021 Left Lumpectomy with deep axillary SLNB  NUMBER OF LYMPH NODES REMOVED: 0/6  CHEMOTHERAPY: 4 cycles, ended 12/01/2021  RADIATION:to start 12/28/2021  HORMONE TREATMENT: will start after radiation  INFECTIONS: Bacterial infection in blood in July 2023  LYMPHEDEMA ASSESSMENTS:   LANDMARK RIGHT  eval  10 cm proximal to olecranon process 32.3  Olecranon process 26.6  10 cm proximal to ulnar styloid process 20.8  Just proximal to ulnar styloid process 15.3  Across hand at thumb web space 19.6  At base of 2nd digit 6.1  (Blank rows = not tested)  LANDMARK LEFT  eval  10 cm proximal to olecranon process 31.4  Olecranon process 25.9  10 cm proximal to ulnar styloid process 19.3  Just proximal to ulnar styloid process 14.5  Across hand at thumb web space 19.2  At base of 2nd digit 5.8  (Blank rows = not tested)  FUNCTIONAL TESTS:  30 seconds chair stand test  8 repetitions Timed up and go (TUG): 4 position balance test; Unable to hold tandem stance on either side  for more than 3 secs,, trunk sway with all positions  GAIT: Distance walked: 50 feet Assistive device utilized: None Level of assistance: Complete Independence Comments: 1 minor step off course, no LOB  L-DEX LYMPHEDEMA SCREENING:  The patient was assessed  using the L-Dex machine today to produce a lymphedema index baseline score. The patient will be reassessed on a regular basis (typically every 3 months) to obtain new L-Dex scores. If the score is > 6.5 points away from his/her baseline score indicating onset of subclinical lymphedema, it will be recommended to wear a compression garment for 4 weeks, 12 hours per day and then be reassessed. If the score continues to be > 6.5 points from baseline at reassessment, we will initiate lymphedema treatment. Assessing in this manner has a 95% rate of preventing clinically significant lymphedema.     QUICK DASH SURVEY: 68%   TODAY'S TREATMENT: 02/06/2022  Therapist questioned pt about being cleared by MD for SOB and chest pain. Pt has not yet seen a physician. Therapist discussed with Leone Payor, clinic supervisor.  She agreed that pt needs to be cleared prior to returning despite pt not being in any acute distress. Felt it was OK to complete recert for pt today but will hold further appts until she is cleared. Pt also notes she would like to go to our [facility] for further treatment secondary to being closer to home. Measured shoulder ROM Tandem stance:Right foot forward 10 seconds, left x 2 reps 2-3 seconds 30 second sit to stand : 7 reps Pt completed quick dash. Spoke with pt about remaining appts. Cancelled this weeks remaining appts. She will let us know when she has been cleared by MD and was asked to bring a note with her to return to therapy.  02/01/22: Pt arrives for aquatic physical therapy. Treatment took place in 3.5-5.5 feet of water. Water temperature was 91 degrees F. Pt entered the pool via stairs with mod use of rails. Pt requires buoyancy of water for support and to offload joints with strengthening exercises.  Seated water bench with 75% submersion Pt performed seated LE AROM exercises 20x in all planes, concurrent verbal education on water principles and how we would be using  them. Pt verbally understood. 75% depth water walking with natural arm swing 6 lengthds in each direction. Standing single knee to chest stretch 3x 10 sec against the wall. High knee marching  across the pool 4 lengths using small noodle for support. Hip 3 ways Bil 10x each using wall for balance. Seated decompression with large noodle behind patient. Intermittent LE AROM                                                                                                                             DATE:  01/30/2022 Pt was started on the Nustep while checking in with pt about how she was feeling. Performed for about 3 min at [facility] and then pt indicated she had been experiencing chest pain and SOB which started about 3 days after her last radiation. She was not in acute distress. O2 Sats were 98, BP 151/89, and after resting 121/83. Messaged Dawn and Dr. Chryl Heck to have her screened before returning to PT.   01/25/2022 Nu Step x 8 min seat 11, UE 10 Lev 4 Sit to stand 3x5 Sidestepping with red band 6 lengths in parallel bars 3 D hip with red band x 10 RJ:JOACZYS, abd, extension Monster walks red band 22 steps each direction Scapular retraction and shoulder extension x 15 ea with red band AX beam forward x 8 lengths normal step length x 4 and tandem x 4, sidestepping x 4 lengths Step ups on ax with alternate hip flexion x 10 ea Bilateral Quad stretch x 2 standing 01/18/2022 Nearly 30 min late Nu step x 6 min seat 11, UE 10, lev 2 In parallel bars: on airex heel raises x 15 no HH, marching x 10 ea no HH,forward and lateral step ups bilaterally x 10, tandem stance x 1 B, DLS on ax eyes closed 3 x 30 sec Scapular retraction and shoulder ext red band x 10 ea with emphasis on good posture and stability  01/11/2022 Performed pt sozo screen and pt is well within the green range Pulleys x 2 min for shoulder flexion and abduction with occasional VC's for technique to assist shoulder ROM Nustep seat 11, UE 10  lev. 2 x 5 Sit to stand 2 x 5 from mat table. In parallel bars: heel raises x 15 no HH, marching x 10 ea no HH, Tandem stance each side x3  for balance Standing gastroc stretch at wall 3 x 20 sec ea, seated HS stretch x3 20 sec SLS x 5 ea Updated HEP  12/28/2021 Evaluation, discussed POC, aquatics etc. Messaged Lindsey to see if pt can do aquatic therapy with her porta cath and radiation  PATIENT EDUCATION:  Education details: POC, aquatics,Access Code: [email or web address] URL: [email or web address] Date: 01/11/2022 Prepared by: Cheral Almas  Exercises - Sit to Stand Without Arm Support  - 2 x daily - 7 x weekly - 2 sets - 5 reps - Standing Heel Raise  - 2 x daily - 7 x weekly - 1 sets - 10 reps - Single Leg Stance  - 1 x daily - 7 x weekly - 1 sets - 5 reps - 10 hold - Standing Hip  Flexion March  - 1 x daily - 7 x weekly - 3 sets - 10 reps - Gastroc Stretch on Wall  - 2 x daily - 7 x weekly - 1 sets - 3 reps - 20 hold - Seated Hamstring Stretch  - 1 x daily - 7 x weekly - 1 sets - 3 reps - 20 hold Person educated: Patient Education method: Explanation Education comprehension: verbalized understanding,demonstrated  HOME EXERCISE PROGRAM: Sit to Stand Without Arm Support  - 2 x daily - 7 x weekly - 2 sets - 5 reps - Standing Heel Raise  - 2 x daily - 7 x weekly - 1 sets - 10 reps - Single Leg Stance  - 1 x daily - 7 x weekly - 1 sets - 5 reps - 10 hold - Standing Hip Flexion March  - 1 x daily - 7 x weekly - 3 sets - 10 reps - Gastroc Stretch on Wall  - 2 x daily - 7 x weekly - 1 sets - 3 reps - 20 hold - Seated Hamstring Stretch  - 1 x daily - 7 x weekly - 1 sets - 3 reps - 20 ho  ASSESSMENT:  CLINICAL IMPRESSION: Recert was performed today and pt has achieved 2/3 STG's and 1/6 LTG's established at evaluation. She has improved left shoulder ROM to WNL and she has been working on her exercises at home. She continues to be at significant fall risk due tp limitation in her 30  second sit to stand which was slightly less today at 7 repetitions. She did improve with her standing feet together balance and achieved ability to hold tandem stance with Right foot forward for 10 seconds but was more limited with the left. She will benefit from continued therapy to allow her to meet remaining goals. Dates extended eight weeks due to pt having to make appt with cardiologist to clear her for PT. She also mentioned she would really like to have therapy at our [facility] as she lives so close. She will call us when she is ready to resume.   OBJECTIVE IMPAIRMENTS: decreased balance, decreased endurance, decreased ROM, decreased strength, impaired UE functional use, postural dysfunction, and pain.   ACTIVITY LIMITATIONS: lifting, standing, sleeping, bed mobility, reach over head, and locomotion level  PARTICIPATION LIMITATIONS: driving, occupation, and recreational activities such as walking  PERSONAL FACTORS: 3+ comorbidities: Left breast cancer s/p chemo and presently having radiation  are also affecting patient's functional outcome.   REHAB POTENTIAL: Good  CLINICAL DECISION MAKING: Stable/uncomplicated  EVALUATION COMPLEXITY: Low  GOALS: Goals reviewed with patient? Yes  SHORT TERM GOALS: Target date: 02/27/2022    Pt will be independent in a HEP for LE strength and balance Baseline: Goal status: MET 02/06/2022 2.  Pt will restore left shoulder ROM to WNL for improved ease with radiation Baseline:  Goal status: MET 02/06/2022 3.  Pt will be able to perform 11 sit to stands in 30 seconds Baseline:  Goal status: In Progress  LONG TERM GOALS: Target date: 04/03/2022   Pts quick dash will be no greater than 20% Baseline:  Goal status:In progress (45% today)  2.  Pt will be able to maintain tandem stance with either foot forward x 10-15 seconds to demonstrate improved balance Baseline:  Goal status: IN progress, Achieved with right foot forward  3.  Pt will  be able to perform 15 sit to stands in 30 seconds to decrease fall risk Baseline:  Goal status:  in progress  4.  Pt will maintain feet together stance 20 seconds without sway Baseline:  Goal status: MET 02/06/2022 5.  Pt will have 5/5 strength bilateral LE's for improved safety Baseline:  Goal status: In progress  6.  Pt will be independent in aquatic exs so she may join a Y or other gym Baseline:  Goal status: In progress  PLAN:  PT FREQUENCY: 3x/week  PT DURATION: 6 weeks  PLANNED INTERVENTIONS: Therapeutic exercises, Therapeutic activity, Neuromuscular re-education, Balance training, Gait training, Patient/Family education, Self Care, Joint mobilization, Orthotic/Fit training, Aquatic Therapy, Dry Needling, scar mobilization, and Manual therapy  PLAN FOR NEXT SESSION: See how pt did with aquatics, Shoulder ROM;wand exs flex, scaption, ER, PROM HEP: sit to stand,balance, generalized strength, grip, and continue Aquatic therapy   PHYSICAL THERAPY DISCHARGE SUMMARY  Visits from Start of Care: 7  Current functional level related to goals / functional outcomes: Achieved 2/3 STG's  and 1/6 LTG's. She continued to be limited with balance, strength and quick dash   Remaining deficits: Balance, LE strength   Education / Equipment: Theraband, HEP   Patient agrees to discharge. Patient goals were partially met. Patient is being discharged due to the patient's request.She would like to have therapy closer to home.  Cheral Almas, PT 02/06/22 10:56 AM

## 2022-02-08 ENCOUNTER — Telehealth: Payer: Self-pay | Admitting: *Deleted

## 2022-02-08 ENCOUNTER — Ambulatory Visit: Payer: 59 | Admitting: Physical Therapy

## 2022-02-08 NOTE — Progress Notes (Signed)
Darlene Peters, counseling intern, met with patient for their scheduled counseling session.   Patient reported they feel like "an alien speaking to humans" when they are explaining their symptoms their doctors. The patient reported they sometimes feel like they are the only cancer patient experiencing these symptoms or that their symptoms are atypical. The patient reported they have been prescribed medication that helps with the pain but the side effects, like sleepiness and grogginess, keeps them from taking the medication. The patient reported their goal is to return back to work.   The patient shared they experienced postpartum depression after giving birth to one of their four children.The patient reported they felt depressed during chemo and had suicidal thoughts.   The patient's goal in counseling is to gather their thoughts and articulate their needs. The patient's next goal is to learn and adapt to their new normal.   The patient scheduled their next counseling session for Wednesday, December 13th at 11am through [place]. The patient received a Zoom link through their email.   Darlene Peters,  Counseling Intern  [phone] [email or web address]

## 2022-02-08 NOTE — Telephone Encounter (Signed)
Contacted patient regarding request/prescription for cranial prosthesis. Patient in agreement with faxing request.

## 2022-02-09 ENCOUNTER — Ambulatory Visit: Payer: 59

## 2022-02-09 ENCOUNTER — Telehealth: Payer: Self-pay

## 2022-02-09 NOTE — Telephone Encounter (Signed)
Lysle Morales, counseling intern, called patient to move their appointment due to a scheduling conflict.   The patient scheduled their new appointment for Wednesday, December 13th, at noon.   Lysle Morales,  Counseling Intern  [phone] [email or web address]

## 2022-02-10 ENCOUNTER — Ambulatory Visit: Payer: 59 | Admitting: Physical Therapy

## 2022-02-14 ENCOUNTER — Ambulatory Visit: Payer: 59 | Admitting: Urology

## 2022-02-15 ENCOUNTER — Encounter: Payer: Self-pay | Admitting: Hematology and Oncology

## 2022-02-15 ENCOUNTER — Ambulatory Visit: Payer: 59 | Admitting: Physical Therapy

## 2022-02-15 NOTE — Progress Notes (Signed)
Darlene Peters, counseling intern, met with patient for their scheduled counseling session.   The patient shared they have been experiencing memory loss post chemo. This week, the patient shared they had a hard time starting their car. The patient reflected the memory loss makes them fearful.   The patient shared they would like to get "back to [their] normal." The patient expressed their biggest concerns are pain management, memory loss, and anxiety. The patient is currently attending aquatics classes which helps them with pain management. The patient reflected they hope counseling will ease the anxiety they are experiencing.    The patients shared about their relationship with their grandchildren and daughter in laws.   The patient scheduled their next counseling session for Thursday, January 4th at [facility] via [place].   Darlene Peters,  Counseling Intern  [phone] [email or web address]

## 2022-02-15 NOTE — Progress Notes (Signed)
                                                                                                                                                             Patient Name: Darlene Peters MRN: [id number] DOB: [birth year] Referring Physician: Benay Pike Date of Service: 01/24/2022 [facility], [place]                                                        End Of Treatment Note  Diagnoses: C50.412-Malignant neoplasm of upper-outer quadrant of left female breast  Cancer Staging:  Cancer Staging  Malignant neoplasm of upper-outer quadrant of left breast in female, estrogen receptor positive ([place]) Staging form: Breast, AJCC 8th Edition - Clinical stage from 06/22/2021: Stage IIA (cT2, cN0, cM0, G2, ER+, PR-, HER2-) - Unsigned Stage prefix: Initial diagnosis Histologic grading system: 3 grade system - Pathologic stage from 12/20/2021: Stage IA (pT2, pN0, cM0, G2, ER+, PR+, HER2-) - Signed by Eppie Gibson, MD on 12/20/2021 Stage prefix: Initial diagnosis Histologic grading system: 3 grade system  Intent: Curative  Radiation Treatment Dates: 12/28/2021 through 01/24/2022 Site Technique Total Dose (Gy) Dose per Fx (Gy) Completed Fx Beam Energies  Breast, Left: Breast_L 3D 40.05/40.05 2.67 15/15 6XFFF, 10XFFF  Breast, Left: Breast_L_Bst 3D 10/10 2 5/5 6X   Narrative: The patient tolerated radiation therapy relatively well.   Plan: The patient will follow-up with radiation oncology in 48mo.  -----------------------------------  SEppie Gibson MD

## 2022-02-17 ENCOUNTER — Ambulatory Visit: Payer: 59 | Admitting: Physical Therapy

## 2022-02-20 ENCOUNTER — Other Ambulatory Visit (HOSPITAL_COMMUNITY): Payer: Self-pay

## 2022-02-20 ENCOUNTER — Other Ambulatory Visit: Payer: Self-pay

## 2022-02-20 ENCOUNTER — Encounter: Payer: Self-pay | Admitting: *Deleted

## 2022-02-20 MED ORDER — TRIAMCINOLONE ACETONIDE 0.1 % EX CREA
1.0000 | TOPICAL_CREAM | Freq: Two times a day (BID) | CUTANEOUS | 3 refills | Status: AC | PRN
  Filled 2022-02-20: qty 454, 31d supply, fill #0

## 2022-02-22 ENCOUNTER — Ambulatory Visit: Payer: 59 | Admitting: Physical Therapy

## 2022-02-24 ENCOUNTER — Ambulatory Visit: Payer: 59 | Admitting: Physical Therapy

## 2022-02-28 ENCOUNTER — Other Ambulatory Visit (HOSPITAL_COMMUNITY): Payer: Self-pay

## 2022-02-28 ENCOUNTER — Encounter: Payer: Self-pay | Admitting: Hematology and Oncology

## 2022-03-01 ENCOUNTER — Ambulatory Visit: Payer: 59 | Admitting: Physical Therapy

## 2022-03-02 ENCOUNTER — Telehealth: Payer: Self-pay

## 2022-03-02 ENCOUNTER — Encounter: Payer: Self-pay | Admitting: Adult Health

## 2022-03-02 ENCOUNTER — Encounter: Payer: Self-pay | Admitting: Radiation Oncology

## 2022-03-02 ENCOUNTER — Ambulatory Visit
Admission: RE | Admit: 2022-03-02 | Discharge: 2022-03-02 | Disposition: A | Discharge status: Home / Self Care | Payer: 59 | Source: Ambulatory Visit | Attending: Radiation Oncology | Admitting: Radiation Oncology

## 2022-03-02 NOTE — Telephone Encounter (Signed)
Darlene Peters was called today for follow-up after completing radiation to her left breast on 01-24-22.  Pain: She reports continued bone and muscle pain- she exercising more and is getting physical therapy for this, she doe not like to take medication for pain  Skin: she does report a rash on her breast and chest, continues to use provided cream-encouraged to use Vit E cream/lotion for two months after this runs out ROM: overall normal ROM, reports painful movement at times Lymphedema: denied with phone call [facility] F/U: Wilber Bihari on 05-25-22 Other issues of note: Pt reports her stress level is slowly improving however she continues to feel anxious. She hopes to go back to work by the end of January. Rn encouraged her to take her time even when returning to work, as she reports continued fatigue overall.   Pt reports Yes No Comments  Tamoxifen '[x]'$  '[]'$    Letrozole '[]'$  '[]'$    Anastrazole '[]'$  '[]'$    Mammogram '[]'$  Date:  '[]'$     RN educated pt on importance of mammograms and gave updated information on finding your new normal (starting in March 2024). She also is planning on joining an exercise program at the [facility] as well. She stated she would call with any concerns or questions.

## 2022-03-03 ENCOUNTER — Ambulatory Visit: Payer: 59 | Admitting: Physical Therapy

## 2022-03-07 ENCOUNTER — Encounter: Payer: Self-pay | Admitting: Adult Health

## 2022-03-07 ENCOUNTER — Encounter: Payer: Self-pay | Admitting: Licensed Clinical Social Worker

## 2022-03-08 ENCOUNTER — Ambulatory Visit: Payer: 59

## 2022-03-09 NOTE — Progress Notes (Signed)
Darlene Peters, counseling intern, met with patient over the phone for their scheduled counseling session.   Patient shared they are continuing to have physical symptoms from treatment which include chest pain, finger pain, fatigue, and memory loss.   The patient shared a doctor told her she has to be her own advocate regarding her health. The patient shared she becomes frustrated with doctors because she shares her symptoms but feels like the doctor's don't take her symptoms seriously or that the doctor's want to mask her symptoms with medication without finding the root cause. The patient shared she feels like this is a common feeling among women of color.   The patient shared she is continuing aquatics and land therapy.   The patient scheduled their next counseling session for Thursday, 1/11 at [facility],  Counseling Intern  [phone] [email or web address]

## 2022-03-13 ENCOUNTER — Encounter: Payer: Self-pay | Admitting: Hematology and Oncology

## 2022-03-13 ENCOUNTER — Inpatient Hospital Stay: Payer: Self-pay | Attending: Hematology and Oncology

## 2022-03-13 ENCOUNTER — Other Ambulatory Visit: Payer: 59

## 2022-03-13 ENCOUNTER — Other Ambulatory Visit: Payer: Self-pay

## 2022-03-13 DIAGNOSIS — Z9221 Personal history of antineoplastic chemotherapy: Secondary | ICD-10-CM | POA: Insufficient documentation

## 2022-03-13 DIAGNOSIS — C50412 Malignant neoplasm of upper-outer quadrant of left female breast: Secondary | ICD-10-CM | POA: Insufficient documentation

## 2022-03-13 DIAGNOSIS — Z170 Estrogen receptor positive status [ER+]: Secondary | ICD-10-CM

## 2022-03-13 DIAGNOSIS — Z8042 Family history of malignant neoplasm of prostate: Secondary | ICD-10-CM | POA: Insufficient documentation

## 2022-03-13 DIAGNOSIS — Z803 Family history of malignant neoplasm of breast: Secondary | ICD-10-CM | POA: Insufficient documentation

## 2022-03-13 DIAGNOSIS — Z7951 Long term (current) use of inhaled steroids: Secondary | ICD-10-CM | POA: Insufficient documentation

## 2022-03-13 DIAGNOSIS — J45909 Unspecified asthma, uncomplicated: Secondary | ICD-10-CM | POA: Insufficient documentation

## 2022-03-13 DIAGNOSIS — Z95828 Presence of other vascular implants and grafts: Secondary | ICD-10-CM

## 2022-03-13 DIAGNOSIS — Z79899 Other long term (current) drug therapy: Secondary | ICD-10-CM | POA: Insufficient documentation

## 2022-03-13 DIAGNOSIS — Z7952 Long term (current) use of systemic steroids: Secondary | ICD-10-CM | POA: Insufficient documentation

## 2022-03-13 LAB — CBC WITH DIFFERENTIAL (CANCER CENTER ONLY)
Abs Immature Granulocytes: 0 10*3/uL (ref 0.00–0.07)
Basophils Absolute: 0 10*3/uL (ref 0.0–0.1)
Basophils Relative: 1 %
Eosinophils Absolute: 0.1 10*3/uL (ref 0.0–0.5)
Eosinophils Relative: 3 %
HCT: 34.4 % — ABNORMAL LOW (ref 36.0–46.0)
Hemoglobin: 12.5 g/dL (ref 12.0–15.0)
Immature Granulocytes: 0 %
Lymphocytes Relative: 19 %
Lymphs Abs: 0.7 10*3/uL (ref 0.7–4.0)
MCH: 31.4 pg (ref 26.0–34.0)
MCHC: 36.3 g/dL — ABNORMAL HIGH (ref 30.0–36.0)
MCV: 86.4 fL (ref 80.0–100.0)
Monocytes Absolute: 0.3 10*3/uL (ref 0.1–1.0)
Monocytes Relative: 7 %
Neutro Abs: 2.7 10*3/uL (ref 1.7–7.7)
Neutrophils Relative %: 70 %
Platelet Count: 171 10*3/uL (ref 150–400)
RBC: 3.98 MIL/uL (ref 3.87–5.11)
RDW: 12.4 % (ref 11.5–15.5)
WBC Count: 3.9 10*3/uL — ABNORMAL LOW (ref 4.0–10.5)
nRBC: 0 % (ref 0.0–0.2)

## 2022-03-13 LAB — CMP (CANCER CENTER ONLY)
ALT: 30 U/L (ref 0–44)
AST: 23 U/L (ref 15–41)
Albumin: 4.1 g/dL (ref 3.5–5.0)
Alkaline Phosphatase: 53 U/L (ref 38–126)
Anion gap: 4 — ABNORMAL LOW (ref 5–15)
BUN: 10 mg/dL (ref 6–20)
CO2: 28 mmol/L (ref 22–32)
Calcium: 9.5 mg/dL (ref 8.9–10.3)
Chloride: 108 mmol/L (ref 98–111)
Creatinine: 0.51 mg/dL (ref 0.44–1.00)
GFR, Estimated: >60 mL/min (ref >60)
Glucose, Bld: 82 mg/dL (ref 70–99)
Potassium: 3.6 mmol/L (ref 3.5–5.1)
Sodium: 140 mmol/L (ref 135–145)
Total Bilirubin: 1.2 mg/dL (ref 0.3–1.2)
Total Protein: 6.2 g/dL — ABNORMAL LOW (ref 6.5–8.1)

## 2022-03-13 LAB — VITAMIN D 25 HYDROXY (VIT D DEFICIENCY, FRACTURES): Vit D, 25-Hydroxy: 26.43 ng/mL — ABNORMAL LOW (ref 30–100)

## 2022-03-13 LAB — VITAMIN B12: Vitamin B-12: 206 pg/mL (ref 180–914)

## 2022-03-13 MED ORDER — HEPARIN SOD (PORK) LOCK FLUSH 100 UNIT/ML IV SOLN
500.0000 [IU] | Freq: Once | INTRAVENOUS | Status: AC
  Administered 2022-03-13: 500 [IU]

## 2022-03-13 MED ORDER — SODIUM CHLORIDE 0.9% FLUSH
10.0000 mL | Freq: Once | INTRAVENOUS | Status: AC
  Administered 2022-03-13: 10 mL

## 2022-03-14 ENCOUNTER — Other Ambulatory Visit (HOSPITAL_COMMUNITY): Payer: Self-pay

## 2022-03-14 ENCOUNTER — Telehealth: Payer: Self-pay

## 2022-03-14 ENCOUNTER — Encounter: Payer: Self-pay | Admitting: Hematology and Oncology

## 2022-03-14 MED ORDER — ERGOCALCIFEROL 1.25 MG (50000 UT) PO CAPS
50000.0000 [IU] | ORAL_CAPSULE | ORAL | 0 refills | Status: AC
  Filled 2022-03-14 – 2022-03-20 (×2): qty 12, 84d supply, fill #0

## 2022-03-14 NOTE — Telephone Encounter (Signed)
Called Pt with below questions. Pt states she takes Vit D2 50,000 Units weekly but does not take B12. Pt encouraged to pick up B12 OTC. Will discuss Vit D with NP and report back to Pt. Pt verbalized understanding.

## 2022-03-14 NOTE — Telephone Encounter (Signed)
Called Pt to discuss Vit D supplementation. Per Wilber Bihari, NP, Pt can continue Vid D as currently prescribed. Pt verbalized understanding.

## 2022-03-14 NOTE — Telephone Encounter (Signed)
-----   Message from Gardenia Phlegm, NP sent at 03/14/2022  8:59 AM EST ----- Please see if patient is taking vitamin d or b12? ----- Message ----- From: Interface, Lab In [place] Sent: 03/13/2022   1:24 PM EST To: Gardenia Phlegm, NP

## 2022-03-16 NOTE — Progress Notes (Signed)
Darlene Peters, counseling intern, met with patient for their scheduled counseling session.   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 The patient shared they are also going to have to move because their rent is too high. The patient is currently not working due to cancer treatment symptoms.   The patient shared when they were in their 35s they were depressed because they felt like they had no value. The patient shared this is when they became a Je[employer]'s [employer]. The patient shared that their faith gave them value and inspired them to find work and value within themselves.   The patient shared they have been experiencing suicidal thoughts during their cancer treatment. The counselor performed a suicidal ideation assessment and concluded their is no imminent danger. The patient's social worker, Garnet Koyanagi will also be following up with the patient.   The patient scheduled their next counseling session for Thursday, January 25th at [facility],  Counseling Intern  [phone] [email or web address]

## 2022-03-17 ENCOUNTER — Inpatient Hospital Stay: Payer: 59 | Admitting: Licensed Clinical Social Worker

## 2022-03-17 ENCOUNTER — Telehealth: Payer: Self-pay | Admitting: Licensed Clinical Social Worker

## 2022-03-17 NOTE — Telephone Encounter (Signed)
Hi Elizabeth, Generally for notes, the Provider or Nurse will provide that for the Patient.

## 2022-03-17 NOTE — Telephone Encounter (Signed)
[facility] Clinical Social Work  CSW attempted to contact pt by phone to follow-up on concerns discussed during pt's appt with counseling intern C. Pesci yesterday. No answer. Left VM asking pt to call back for support and resource numbers.   Namira Rosekrans E Khyrie Masi, LCSW

## 2022-03-17 NOTE — Progress Notes (Signed)
Received MyChart message regarding medical accommodations. Letter signed by Dr. Chryl Heck and faxed to the [facility] Team at [facility]. Fax confirmation received.

## 2022-03-17 NOTE — Progress Notes (Signed)
[facility] CSW Progress Note  Clinical Education officer, museum contacted patient by phone to follow up on behavioral health. Confirmed information from counseling intern's assessment yesterday and no active plan or intent to harm self. Reviewed options for support, including 988, [facility] mobile crisis, [place] and local ED. Provided contact information. Pt encouraged to reach out to any of those resources after hours or on weekends and is welcome to call here during the week if needed.    Catrice Zuleta E Takai Chiaramonte, LCSW

## 2022-03-20 ENCOUNTER — Encounter (HOSPITAL_COMMUNITY): Payer: Self-pay

## 2022-03-20 ENCOUNTER — Other Ambulatory Visit (HOSPITAL_COMMUNITY): Payer: Self-pay

## 2022-03-20 ENCOUNTER — Encounter: Payer: Self-pay | Admitting: Hematology and Oncology

## 2022-03-22 ENCOUNTER — Encounter: Payer: Self-pay | Admitting: Hematology and Oncology

## 2022-03-22 ENCOUNTER — Other Ambulatory Visit (HOSPITAL_COMMUNITY): Payer: Self-pay

## 2022-03-23 ENCOUNTER — Other Ambulatory Visit: Payer: Self-pay

## 2022-03-23 ENCOUNTER — Encounter: Payer: Self-pay | Admitting: Hematology and Oncology

## 2022-03-30 ENCOUNTER — Encounter: Payer: Self-pay | Admitting: *Deleted

## 2022-03-30 NOTE — Progress Notes (Signed)
Darlene Peters, counseling intern, met with patient for their scheduled counseling session.   The patient shared they have been experiencing moments of "despair." The patient shared, in these moments, they remind themselves of their dreams and goals and pray. The counselor assessed for SI. The patient shared they have not been experiencing SI thoughts.   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 The patient shared when they retire, they plan to buy land and build a farm.   The patient shared they are in between insurance right now and have canceled their doctors appointments to address their lingering chemo and radiation symptoms. The patient shared they have accepted that healing takes time.   The counselor told the patient about morning pages - writing three pages first thing every morning. The morning pages are used as a grounding technique. The patient expressed they would like to try them.   The patient scheduled their next counseling session for Thursday, February 1st at [facility],  Counseling Intern  [phone] [email or web address]

## 2022-04-03 ENCOUNTER — Encounter: Payer: Self-pay | Admitting: Hematology and Oncology

## 2022-04-06 NOTE — Progress Notes (Signed)
Lysle Morales, counseling intern, met with patient for their scheduled counseling session.   The patient shared they were recently gifted a cookbook. The [employer] of the book wrote about her experience after treatment. The author reflected she felt left to her own devices in managing her treatment symptoms. The patient shared when she read the introduction, she was moved to tears because she felt seen and heard.   The patient shared she spoke to her work and will start a slow transition back. The patient shared her struggles looking for resources during treatment.   The counselor shared with the client that we will be moving to scheduling sessions every other week in March.   The patient scheduled her next counseling session for Thursday, Feb. 8th.   Lysle Morales,  Counseling Intern  [phone] [email or web address]

## 2022-04-13 NOTE — Progress Notes (Signed)
Darlene Peters, counseling intern, met with patient for scheduled counseling session.   Patient shared they have been feeling low the last two days. The patient shared they are trying to balance family, work, illness, and their relationship with their partner.   The patient shared they have a new lease on life after finishing their cancer treatment. The patient shared they are looking forward to experiencing more fun and joy. The patient shared they are planning to go to the park this week to sit and write.   The patient scheduled their next counseling session for Thursday, February 22nd at [facility],  Counseling Intern  [phone] [email or web address]

## 2022-04-17 ENCOUNTER — Ambulatory Visit: Payer: Self-pay

## 2022-04-17 ENCOUNTER — Encounter: Payer: Self-pay | Admitting: Hematology and Oncology

## 2022-04-25 ENCOUNTER — Other Ambulatory Visit (HOSPITAL_COMMUNITY): Payer: Self-pay

## 2022-04-27 NOTE — Progress Notes (Signed)
Darlene Peters, counseling intern, met with patient for their scheduled counseling session.   The patient shared her strengths - assertiveness, telling the truth when asked, timeliness, perseverance, resilience.   The patient reflected on how she has "softened" with her grandchildren. The patient spoke about her immediate goals re: work and housing.   The patient scheduled her next counseling session for Thursday, Feb. 29th at [facility],  Counseling Intern  [phone] [email or web address]

## 2022-05-01 ENCOUNTER — Encounter: Payer: Self-pay | Admitting: Adult Health

## 2022-05-02 ENCOUNTER — Telehealth: Payer: Self-pay

## 2022-05-02 NOTE — Telephone Encounter (Signed)
Faxed medical clearance form to [facility] for pt to participate in resort. Fax confirmation received.

## 2022-05-04 NOTE — Progress Notes (Signed)
Darlene Peters, counseling intern, met with patient for their scheduled counseling session.   The patient reported they were denied disability. The patient reflected their immediate reaction was to power through and start working. The patient shared their husband and mother both said they don't think she is ready to return to work. The patient reflected they want to begin working slowly so that they can continue to heal.   The patient shared they visited their primary care doctor who told her the mystery side effects she has been experiencing are likely from her medication. The patient reflected they felt comforted by being given an explanation.   The patient currently does not have insurance. The patient and counselor are researching ways for the patient to continue to see a therapist without paying astronomical amounts.   The patient scheduled their next counseling session for Thursday, March 14th at [facility],  Counseling Intern  [phone] [email or web address]

## 2022-05-05 ENCOUNTER — Encounter: Payer: Self-pay | Admitting: Hematology and Oncology

## 2022-05-05 ENCOUNTER — Other Ambulatory Visit (HOSPITAL_COMMUNITY): Payer: Self-pay

## 2022-05-05 ENCOUNTER — Other Ambulatory Visit: Payer: Self-pay

## 2022-05-18 NOTE — Progress Notes (Signed)
Lysle Morales, counseling intern, met with patient for the patient's scheduled counseling session.   The patient reported she has "good days" and "bad days." The patient reported she is still feels pain in her finger tips, brain fog, and dizziness.   The patient reported she wants to give back when she is feeling better by sharing resources with other cancer patient. The counselor reflected they have seen the patient research and seek out resources beyond what is provided for her at the hospital and this is a strength not everyone has.   The patient reported her most important goal is spiritual growth. The patient reported her spiritual growth will be evident in becoming more generous, understanding and patient.   The patient reported she has the contact of another counselor she plans to work with. The counselor asked if this week one of her goals could be setting up an appointment with that counselor. The patient responded yes.   The patient scheduled their last counseling session for Thursday, March 28th at [facility],  Counseling Intern  [phone] [email or web address]

## 2022-05-25 ENCOUNTER — Inpatient Hospital Stay: Payer: Self-pay | Attending: Hematology and Oncology | Admitting: Adult Health

## 2022-05-25 ENCOUNTER — Encounter: Payer: Self-pay | Admitting: *Deleted

## 2022-05-26 ENCOUNTER — Telehealth: Payer: Self-pay | Admitting: Adult Health

## 2022-05-26 NOTE — Telephone Encounter (Signed)
Scheduled appointment per 3/21 secure chat. Left voicemail.

## 2022-05-29 ENCOUNTER — Other Ambulatory Visit (HOSPITAL_COMMUNITY): Payer: Self-pay

## 2022-05-29 ENCOUNTER — Other Ambulatory Visit: Payer: Self-pay

## 2022-06-01 NOTE — Progress Notes (Signed)
Darlene Peters, counseling intern, met with patient for their scheduled counseling session.   The patient shared they recently finished their move to [place], [place] and are starting to settle in.   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   This is was the last counseling session between the patient and the counseling intern due to the counseling intern completing their internship.   The patient has been given resources if needs arise in the future.   Darlene Peters,  Counseling Intern  [phone] [email or web address]

## 2022-06-21 ENCOUNTER — Telehealth: Payer: Self-pay | Admitting: Adult Health

## 2022-06-21 NOTE — Telephone Encounter (Signed)
Rescheduled appointment per provider PAL. Left voicemail. 

## 2022-06-22 ENCOUNTER — Encounter: Payer: Self-pay | Admitting: Adult Health

## 2022-06-27 ENCOUNTER — Encounter: Payer: Self-pay | Admitting: Adult Health

## 2022-06-27 ENCOUNTER — Inpatient Hospital Stay: Payer: Self-pay | Attending: Hematology and Oncology | Admitting: Adult Health

## 2022-06-27 DIAGNOSIS — C50412 Malignant neoplasm of upper-outer quadrant of left female breast: Secondary | ICD-10-CM

## 2022-06-27 DIAGNOSIS — Z170 Estrogen receptor positive status [ER+]: Secondary | ICD-10-CM

## 2022-06-27 NOTE — Progress Notes (Unsigned)
Unable to reach patient.  Called x 2 and LMOM.    Will attempt to reschedule

## 2022-06-28 ENCOUNTER — Encounter: Payer: Self-pay | Admitting: Hematology and Oncology

## 2022-06-30 ENCOUNTER — Telehealth: Payer: Self-pay | Admitting: Adult Health

## 2022-06-30 NOTE — Telephone Encounter (Signed)
Scheduled appointment per 4/25 los. Patient is aware of the made appointment.

## 2022-07-06 ENCOUNTER — Encounter: Payer: Self-pay | Admitting: Adult Health

## 2022-07-07 ENCOUNTER — Inpatient Hospital Stay: Payer: Self-pay | Attending: Hematology and Oncology | Admitting: Adult Health

## 2022-07-07 ENCOUNTER — Inpatient Hospital Stay: Payer: Self-pay | Admitting: Licensed Clinical Social Worker

## 2022-07-07 DIAGNOSIS — Z170 Estrogen receptor positive status [ER+]: Secondary | ICD-10-CM

## 2022-07-07 DIAGNOSIS — C50412 Malignant neoplasm of upper-outer quadrant of left female breast: Secondary | ICD-10-CM

## 2022-07-07 NOTE — Progress Notes (Signed)
[facility] Clinical Social Work  Clinical Social Work was referred by medical provider for assessment of needs related to insurance.  Clinical Social Worker contacted patient by phone to offer support and assess for needs.   Pt's insurance lapsed while on disability leave from work and she was not aware of this. She had applied for Medicaid but was denied due to being just over the income limit.  Unfortunately she cannot currently apply through the marketplace b/c she does not qualify for a special enrollment period.  CSW encouraged pt to follow-up with application for [facility] financial assistance.  Hopefully pt will be able to return to work soon (working out accommodations currently) and restart her benefits.    Pt otherwise feels she is doing well emotionally and has been very engaged with various cancer organizations for information, support, and community. She is planning to attend a camp for female cancer survivors soon and is looking forward to that week.   Matina Rodier E Jurney Overacker, LCSW  Clinical Social Worker [facility]

## 2022-07-07 NOTE — Progress Notes (Signed)
SURVIVORSHIP VIRTUAL VISIT:  I connected with Darlene Peters on 07/07/22 at  8:45 AM EDT by telephone and verified that I am speaking with the correct person using two identifiers.  I discussed the limitations, risks, security and privacy concerns of performing an evaluation and management service by telephone and the availability of in person appointments. I also discussed with the patient that there may be a patient responsible charge related to this service. The patient expressed understanding and agreed to proceed.   Provider location: [facility] office Patient location: at home  BRIEF ONCOLOGIC HISTORY:  Oncology History  Malignant neoplasm of upper-outer quadrant of left breast in female, estrogen receptor positive (HCC)  06/14/2021 Initial Diagnosis   Malignant neoplasm of upper-outer quadrant of left breast in female, estrogen receptor positive (HCC)   07/26/2021 Genetic Testing   Negative hereditary cancer genetic testing: no pathogenic variants detected in [facility] BRCAPlus Panel and CancerNext +RNAinsight Panel.  Report dates are Jul 26, 2021 and August 08, 2021.   The BRCAplus panel offered by [employer] and includes sequencing and deletion/duplication analysis for the following 8 genes: ATM, BRCA1, BRCA2, CDH1, CHEK2, PALB2, PTEN, and TP53.  The CancerNext gene panel offered by [employer] includes sequencing, rearrangement analysis, and RNA analysis for the following 36 genes:   APC, ATM, AXIN2, BARD1, BMPR1A, BRCA1, BRCA2, BRIP1, CDH1, CDK4, CDKN2A, CHEK2, DICER1, HOXB13, EPCAM, GREM1, MLH1, MSH2, MSH3, MSH6, MUTYH, NBN, NF1, NTHL1, PALB2, PMS2, POLD1, POLE, PTEN, RAD51C, RAD51D, RECQL, SMAD4, SMARCA4, STK11, and TP53.    08/03/2021 Definitive Surgery   She had left breast lumpectomy with invasive ductal carcinoma, 2.7 cm, grade 2, resection margins negative, all sentinel lymph nodes negative, prior prognostics ER 15% positive moderate staining, PR 0% negative, HER2 negative, Ki-67 of  5%. Oncotype score of 28, distant recurrence risk at 9 years of 17%, absolute benefit of chemotherapy greater than 15% on Oncotype she tested positive for ER and PR and HER2 negative   09/28/2021 - 12/01/2021 Adjuvant Chemotherapy   Taxotere/Cytoxan every 21 days x 4 cycles   12/20/2021 Cancer Staging   Staging form: Breast, AJCC 8th Edition - Pathologic stage from 12/20/2021: Stage IA (pT2, pN0, cM0, G2, ER+, PR+, HER2-) - Signed by Lonie Peak, MD on 12/20/2021 Stage prefix: Initial diagnosis Histologic grading system: 3 grade system   12/28/2021 - 01/24/2022 Radiation Therapy   Site Technique Total Dose (Gy) Dose per Fx (Gy) Completed Fx Beam Energies  Breast, Left: Breast_L 3D 40.05/40.05 2.67 15/15 6XFFF, 10XFFF  Breast, Left: Breast_L_Bst 3D 10/10 2 5/5 6X     02/2022 -  Anti-estrogen oral therapy   Tamoxifen     INTERVAL HISTORY:  Darlene Peters to review her survivorship care plan detailing her treatment course for breast cancer, as well as monitoring long-term side effects of that treatment, education regarding health maintenance, screening, and overall wellness and health promotion.     Overall, Darlene Peters reports feeling moderately well.  She has noted that since taking tamoxifen she has experienced thinning hair and eyebrows/eyelashes.  She is experiencing hot flashes.  She is ready to transition back to work.  She works at [employer] [place].  She is on long term disability and needs an accommodation letter for returning.  Since undergoing treatment she has struggled with residual fatigue.  She notes that she has some vision changes with floaters and struggles with seeing things on a regular monitor.  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  She is  seeing members of her medical team monthly, PT, and vocational rehab to help with her recovery.    She still has a port in place.  Me that she has not had this removed because she does not have  insurance.  REVIEW OF SYSTEMS:  Review of Systems  Constitutional:  Positive for fatigue. Negative for appetite change, chills, fever and unexpected weight change.  HENT:   Negative for hearing loss, lump/mass and trouble swallowing.   Eyes:  Negative for eye problems and icterus.  Respiratory:  Negative for chest tightness, cough and shortness of breath.   Cardiovascular:  Negative for chest pain, leg swelling and palpitations.  Gastrointestinal:  Negative for abdominal distention, abdominal pain, constipation, diarrhea, nausea and vomiting.  Endocrine: Negative for hot flashes.  Genitourinary:  Negative for difficulty urinating.   Musculoskeletal:  Negative for arthralgias.  Skin:  Negative for itching and rash.  Neurological:  Positive for numbness. Negative for dizziness, extremity weakness and headaches.  Hematological:  Negative for adenopathy. Does not bruise/bleed easily.  Psychiatric/Behavioral:  Negative for depression. The patient is not nervous/anxious.   Breast: Denies any new nodularity, masses, tenderness, nipple changes, or nipple discharge.        PAST MEDICAL/SURGICAL HISTORY:  Past Medical History:  Diagnosis Date   Asthma    "worse when lying down"   Breast cancer (HCC)    Family history of breast cancer 07/19/2021   Family history of prostate cancer 07/19/2021   GERD (gastroesophageal reflux disease)    Psoriasis    Urticaria    Past Surgical History:  Procedure Laterality Date   ABDOMINAL HYSTERECTOMY     partial   BREAST BIOPSY Left 05/26/2021   BREAST LUMPECTOMY WITH RADIOACTIVE SEED AND SENTINEL LYMPH NODE BIOPSY Left 08/03/2021   Procedure: LEFT BREAST LUMPECTOMY WITH RADIOACTIVE SEED AND SENTINEL LYMPH NODE BIOPSY;  Surgeon: Griselda Miner, MD;  Location: [facility];  Service: General;  Laterality: Left;   IR IMAGING GUIDED PORT INSERTION  09/26/2021   RADIOACTIVE SEED GUIDED AXILLARY SENTINEL LYMPH NODE Left 08/03/2021   Procedure:  RADIOACTIVE SEED GUIDED LEFT AXILLARY SENTINEL LYMPH NODE DISSECTION;  Surgeon: Griselda Miner, MD;  Location: [facility];  Service: General;  Laterality: Left;   TUMOR REMOVAL Left 08/2021   breast     ALLERGIES:  Allergies  Allergen Reactions   Shellfish Allergy Rash   Fish Allergy    Gluten Meal    Latex    Other     Pt states no blood transfusion. Is a Jehovah Witness   Docetaxel Other (See Comments)    Epigastric pain     CURRENT MEDICATIONS:  Outpatient Encounter Medications as of 07/07/2022  Medication Sig Note   Acetaminophen (TYLENOL 8 HOUR PO) Take 1 tablet by mouth every 6 (six) hours as needed (pain).    albuterol (PROVENTIL HFA) 108 (90 Base) MCG/ACT inhaler Inhale 2 puffs into the lungs every 4 (four) hours as needed for wheezing or shortness of breath. 10/02/2021: Pt has med but has not used it recently   buPROPion (WELLBUTRIN XL) 300 MG 24 hr tablet Take 1 tablet  by mouth daily.    buPROPion (WELLBUTRIN) 75 MG tablet Take 1 tablet (75 mg total) by mouth 2 (two) times daily.    clobetasol (TEMOVATE) 0.05 % external solution Apply topically to affected areas twice daily as needed (not to face,groin,or axilla) 10/02/2021: Pt has med but has not used it recently   desonide (DESOWEN)  0.05 % lotion Apply topically to affected areas up to  twice daily as needed (not to face,groin,or axilla)    dexamethasone (DECADRON) 4 MG tablet Take 1 tablet (4 mg total) by mouth 2 (two) times daily. Start the day before Taxotere. Then again the day after chemo for 3 days.    docusate sodium (COLACE) 100 MG capsule Take 1 capsule (100 mg total) by mouth every 12 (twelve) hours.    famotidine (PEPCID) 20 MG tablet Take 1 tablet (20 mg total) by mouth 2 (two) times daily.    fluticasone-salmeterol (ADVAIR DISKUS) 250-50 MCG/ACT AEPB Inhale 1 puff into the lungs 1 day or 1 dose.    gabapentin (NEURONTIN) 100 MG capsule Take 1 capsule (100 mg total) by mouth 3 (three) times daily.     levocetirizine (XYZAL) 5 MG tablet Take 1 tablet (5 mg total) by mouth every evening.    lidocaine-prilocaine (EMLA) cream Apply to affected area once. 10/02/2021: Pt has this medication but has not used it yet   ondansetron (ZOFRAN) 8 MG tablet Take 8 mg by mouth 2 (two) times daily.    polyethylene glycol (MIRALAX / GLYCOLAX) 17 g packet Take 17 g by mouth daily.    prochlorperazine (COMPAZINE) 10 MG tablet Take 1 tablet (10 mg total) by mouth every 6 (six) hours as needed for nausea or vomiting.    tamoxifen (NOLVADEX) 20 MG tablet Take 1 tablet (20 mg total) by mouth daily.    triamcinolone cream (KENALOG) 0.1 % Apply topically to affected areas twice daily as needed (not to face,groin,or axilla)    triamcinolone cream (KENALOG) 0.1 % Apply topically twice daily every day for 10-14 days then as needed to see if helps 90 days    triamcinolone cream (KENALOG) 0.1 % Apply 1 Application topically 2 (two) times daily as needed to affected areas (not to face, groin, or axilla).    No facility-administered encounter medications on file as of 07/07/2022.   ONCOLOGIC FAMILY HISTORY:  Family History  Problem Relation Age of Onset   Allergic rhinitis Mother    Allergic rhinitis Sister    Allergic rhinitis Sister    Breast cancer Maternal Aunt 45   Prostate cancer Maternal Uncle        mets; dx after 50   Prostate cancer Paternal Uncle    Leukemia Paternal Uncle    Prostate cancer Paternal Uncle 9   Gastric cancer Paternal Uncle    Skin cancer Maternal Grandmother 74   Breast cancer Cousin 61       maternal cousin   Cervical cancer Cousin    Breast cancer Cousin        dx 80s; maternal female cousin   Breast cancer Cousin        paternal female cousin x2    SOCIAL HISTORY:  Social History   Socioeconomic History   Marital status: Married    Spouse name: Not on file   Number of children: Not on file   Years of education: Not on file   Highest education level: Not on file   Occupational History   Not on file  Tobacco Use   Smoking status: Never    Passive exposure: Never   Smokeless tobacco: Never  Vaping Use   Vaping Use: Never used  Substance and Sexual Activity   Alcohol use: No   Drug use: No   Sexual activity: Yes    Birth control/protection: Surgical  Other Topics Concern   Not on  file  Social History Narrative   Not on file   Social Determinants of Health   Financial Resource Strain: High Risk (07/14/2021)   Overall Financial Resource Strain (CARDIA)    Difficulty of Paying Living Expenses: Hard  Food Insecurity: Food Insecurity Present (02/02/2022)   Hunger Vital Sign    Worried About Running Out of Food in the Last Year: Sometimes true    Ran Out of Food in the Last Year: Sometimes true  Transportation Needs: Unmet Transportation Needs (10/19/2021)   PRAPARE - Administrator, Civil Service (Medical): Yes    Lack of Transportation (Non-Medical): No  Physical Activity: Not on file  Stress: Not on file  Social Connections: Not on file  Intimate Partner Violence: Not on file     OBSERVATIONS/OBJECTIVE:  Patient sounds well and is in no apparent distress, mood and behavior are normal  LABORATORY DATA:  None for this visit.  DIAGNOSTIC IMAGING:  None for this visit.      ASSESSMENT AND PLAN:  Darlene Peters is a pleasant 53 y.o. female with Stage IA left breast invasive ductal carcinoma, ER+/PR+/HER2-, diagnosed in 06/2021, treated with lumpectomy, adjuvant chemotherapy, adjuvant radiation therapy, and anti-estrogen therapy with Tamoxifen  beginning in 02/2022.  She presents to the Survivorship Clinic for our initial meeting and routine follow-up post-completion of treatment for breast cancer.    1. Stage IA left breast cancer:  Darlene Peters is continuing to recover from definitive treatment for breast cancer. She will follow-up with her medical oncologist, Dr. Al Pimple in 11/2022 with history and physical exam per surveillance  protocol.  She will continue her anti-estrogen therapy with Tamoxifen.  She is tolerating this moderately well with exception of hot flashses and hair thinning. Though unpleasant she is managing these well.  Her mammogram is due 12/2022; orders placed today. . Today, a comprehensive survivorship care plan and treatment summary was reviewed with the patient today detailing her breast cancer diagnosis, treatment course, potential late/long-term effects of treatment, appropriate follow-up care with recommendations for the future, and patient education resources.  A copy of this summary, along with a letter will be sent to the patient's primary care provider via mail/fax/In Basket message after today's visit.    2.  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  Due to her side effects from treatment my recommendation is for her to work part-time for 6 months remotely through cold and flu season 2025 (spring).  I also requested that she have the ability to leave intermittently to attend her medical, physical therapy, and vocational rehab appointments.  Considering her struggles with vision and neuropathy a larger monitor and aerodynamic desk and chair will likely help.  3. Port-A-Cath in place: She still has her port in place and has not had it flushed in about 6 to 7 months and has not had it removed due to her not having insurance.  Her income is at $19,000 a year and she says she has just missed the Medicaid cutoff.  When I discussed Medicaid expansion with her she says she is tried to get that however has struggled with getting connected to the right people.  I have sent our social worker Tempie Donning a request to reach out to the patient and follow-up with her about this.  4. Bone health:  She was given education on specific activities to promote bone health.  5. Cancer screening:  Due to Darlene Peters history and her age, she should receive  screening for skin cancers, colon cancer, and gynecologic cancers.  The information and recommendations are listed on the patient's comprehensive care plan/treatment summary and were reviewed in detail with the patient.    6. Health maintenance and wellness promotion: Darlene Peters was encouraged to consume 5-7 servings of fruits and vegetables per day. We reviewed the "Nutrition Rainbow" handout.  She was also encouraged to engage in moderate to vigorous exercise for 30 minutes per day most days of the week. She was instructed to limit her alcohol consumption and continue to abstain from tobacco use.     7. Support services/counseling: It is not uncommon for this period of the patient's cancer care trajectory to be one of many emotions and stressors.   She was given information regarding our available services and encouraged to contact me with any questions or for help enrolling in any of our support group/programs.    Follow up instructions:    -Return to cancer center 11/2022 for f/u with Dr. Al Pimple  -Mammogram due in 12/2022 -Follow up with surgery ASAP for port removal -Follow up with social worker for discussion of medicaid application -She is welcome to return back to the Survivorship Clinic at any time; no additional follow-up needed at this time.  -Consider referral back to survivorship as a long-term survivor for continued surveillance  The patient was provided an opportunity to ask questions and all were answered. The patient agreed with the plan and demonstrated an understanding of the instructions.   The patient was advised to call back or seek an in-person evaluation if the symptoms worsen or if the condition fails to improve as anticipated.   I provided 25 minutes of non face-to-face telephone visit time during this encounter, and > 50% was spent counseling as documented under my assessment & plan.  Lillard Anes, NP 07/07/22 9:29 AM Medical Oncology and Hematology [facility] [address] [place], [place] [address] Tel. [phone]    Fax. [phone]

## 2022-07-13 ENCOUNTER — Telehealth: Payer: Self-pay | Admitting: Hematology and Oncology

## 2022-07-13 NOTE — Telephone Encounter (Signed)
Scheduled appointments per los. Patient is aware of the made appointments. 

## 2022-07-17 ENCOUNTER — Other Ambulatory Visit: Payer: Self-pay | Admitting: *Deleted

## 2022-07-17 ENCOUNTER — Encounter: Payer: Self-pay | Admitting: Adult Health

## 2022-07-17 NOTE — Progress Notes (Signed)
Referral from [facility] received through email for medical clearance for pt

## 2022-07-18 NOTE — Progress Notes (Signed)
Accomodation letter written by NP and faxed to [employer] long term disability at [phone] with receipt confirmation.

## 2022-08-03 ENCOUNTER — Telehealth: Payer: Self-pay

## 2022-08-03 NOTE — Telephone Encounter (Signed)
Notified Patient of completion of Disability forms. Request for medical records forwarded to [facility] with signed Release of Information Form. Fax transmission confirmations received. Copy of forms mailed to Patient as requested. No other needs or concerns voiced at this time.

## 2022-08-07 ENCOUNTER — Inpatient Hospital Stay: Payer: 59

## 2022-08-09 ENCOUNTER — Inpatient Hospital Stay: Payer: 59 | Attending: Hematology and Oncology

## 2022-08-09 ENCOUNTER — Other Ambulatory Visit: Payer: Self-pay

## 2022-08-09 DIAGNOSIS — Z170 Estrogen receptor positive status [ER+]: Secondary | ICD-10-CM | POA: Diagnosis present

## 2022-08-09 DIAGNOSIS — Z95828 Presence of other vascular implants and grafts: Secondary | ICD-10-CM

## 2022-08-09 DIAGNOSIS — C50412 Malignant neoplasm of upper-outer quadrant of left female breast: Secondary | ICD-10-CM | POA: Diagnosis present

## 2022-08-09 LAB — CMP (CANCER CENTER ONLY)
ALT: 12 U/L (ref 0–44)
AST: 14 U/L — ABNORMAL LOW (ref 15–41)
Albumin: 4 g/dL (ref 3.5–5.0)
Alkaline Phosphatase: 58 U/L (ref 38–126)
Anion gap: 5 (ref 5–15)
BUN: 10 mg/dL (ref 6–20)
CO2: 29 mmol/L (ref 22–32)
Calcium: 9.4 mg/dL (ref 8.9–10.3)
Chloride: 108 mmol/L (ref 98–111)
Creatinine: 0.55 mg/dL (ref 0.44–1.00)
GFR, Estimated: >60 mL/min (ref >60)
Glucose, Bld: 105 mg/dL — ABNORMAL HIGH (ref 70–99)
Potassium: 3.6 mmol/L (ref 3.5–5.1)
Sodium: 142 mmol/L (ref 135–145)
Total Bilirubin: 0.6 mg/dL (ref 0.3–1.2)
Total Protein: 6.7 g/dL (ref 6.5–8.1)

## 2022-08-09 LAB — CBC WITH DIFFERENTIAL (CANCER CENTER ONLY)
Abs Immature Granulocytes: 0.01 10*3/uL (ref 0.00–0.07)
Basophils Absolute: 0 10*3/uL (ref 0.0–0.1)
Basophils Relative: 1 %
Eosinophils Absolute: 0.1 10*3/uL (ref 0.0–0.5)
Eosinophils Relative: 3 %
HCT: 35.7 % — ABNORMAL LOW (ref 36.0–46.0)
Hemoglobin: 12.5 g/dL (ref 12.0–15.0)
Immature Granulocytes: 0 %
Lymphocytes Relative: 26 %
Lymphs Abs: 1 10*3/uL (ref 0.7–4.0)
MCH: 31.1 pg (ref 26.0–34.0)
MCHC: 35 g/dL (ref 30.0–36.0)
MCV: 88.8 fL (ref 80.0–100.0)
Monocytes Absolute: 0.3 10*3/uL (ref 0.1–1.0)
Monocytes Relative: 7 %
Neutro Abs: 2.4 10*3/uL (ref 1.7–7.7)
Neutrophils Relative %: 63 %
Platelet Count: 144 10*3/uL — ABNORMAL LOW (ref 150–400)
RBC: 4.02 MIL/uL (ref 3.87–5.11)
RDW: 12.9 % (ref 11.5–15.5)
WBC Count: 3.8 10*3/uL — ABNORMAL LOW (ref 4.0–10.5)
nRBC: 0 % (ref 0.0–0.2)

## 2022-08-09 MED ORDER — HEPARIN SOD (PORK) LOCK FLUSH 100 UNIT/ML IV SOLN
500.0000 [IU] | Freq: Once | INTRAVENOUS | Status: AC
  Administered 2022-08-09: 500 [IU]

## 2022-08-09 MED ORDER — SODIUM CHLORIDE 0.9% FLUSH
10.0000 mL | Freq: Once | INTRAVENOUS | Status: AC
  Administered 2022-08-09: 10 mL

## 2022-08-22 ENCOUNTER — Telehealth: Payer: Self-pay

## 2022-08-22 NOTE — Telephone Encounter (Signed)
Merdical clearance for [facility] [employer] faxed to Lacretia Leigh at [email or web address]

## 2022-08-25 ENCOUNTER — Ambulatory Visit: Payer: 59 | Admitting: Hematology and Oncology

## 2022-08-28 ENCOUNTER — Encounter: Payer: Self-pay | Admitting: Hematology and Oncology

## 2022-09-01 ENCOUNTER — Encounter: Payer: Self-pay | Admitting: Hematology and Oncology

## 2022-09-04 ENCOUNTER — Inpatient Hospital Stay: Payer: 59 | Attending: Hematology and Oncology

## 2022-09-04 DIAGNOSIS — Z452 Encounter for adjustment and management of vascular access device: Secondary | ICD-10-CM | POA: Insufficient documentation

## 2022-09-04 DIAGNOSIS — Z170 Estrogen receptor positive status [ER+]: Secondary | ICD-10-CM | POA: Insufficient documentation

## 2022-09-04 DIAGNOSIS — C50412 Malignant neoplasm of upper-outer quadrant of left female breast: Secondary | ICD-10-CM | POA: Insufficient documentation

## 2022-10-02 ENCOUNTER — Inpatient Hospital Stay: Payer: 59

## 2022-10-02 ENCOUNTER — Other Ambulatory Visit: Payer: Self-pay

## 2022-10-02 DIAGNOSIS — C50412 Malignant neoplasm of upper-outer quadrant of left female breast: Secondary | ICD-10-CM

## 2022-10-02 DIAGNOSIS — Z95828 Presence of other vascular implants and grafts: Secondary | ICD-10-CM

## 2022-10-02 DIAGNOSIS — Z170 Estrogen receptor positive status [ER+]: Secondary | ICD-10-CM | POA: Diagnosis present

## 2022-10-02 DIAGNOSIS — Z452 Encounter for adjustment and management of vascular access device: Secondary | ICD-10-CM | POA: Diagnosis present

## 2022-10-02 MED ORDER — SODIUM CHLORIDE 0.9% FLUSH
10.0000 mL | Freq: Once | INTRAVENOUS | Status: AC
  Administered 2022-10-02: 10 mL

## 2022-10-02 MED ORDER — HEPARIN SOD (PORK) LOCK FLUSH 100 UNIT/ML IV SOLN
500.0000 [IU] | Freq: Once | INTRAVENOUS | Status: AC
  Administered 2022-10-02: 500 [IU]

## 2022-10-09 ENCOUNTER — Other Ambulatory Visit (HOSPITAL_COMMUNITY): Payer: Self-pay

## 2022-10-30 ENCOUNTER — Inpatient Hospital Stay: Payer: 59

## 2022-10-30 ENCOUNTER — Encounter: Payer: Self-pay | Admitting: Adult Health

## 2022-10-31 ENCOUNTER — Other Ambulatory Visit: Payer: Self-pay | Admitting: Adult Health

## 2022-10-31 DIAGNOSIS — C50412 Malignant neoplasm of upper-outer quadrant of left female breast: Secondary | ICD-10-CM

## 2022-10-31 DIAGNOSIS — Z95828 Presence of other vascular implants and grafts: Secondary | ICD-10-CM

## 2022-11-13 NOTE — Progress Notes (Signed)
Faxed referral per her request to [facility], called [phone] and confirmed fax #. Faxed referral to [phone], received fax confirmation.

## 2022-11-24 ENCOUNTER — Telehealth: Payer: Self-pay

## 2022-11-24 NOTE — Telephone Encounter (Signed)
Notified Patient of completion of Medical Accommodations Request form. Fax transmission confirmation received. Copy of form emailed to patient as requested. No other needs or concerns voiced at this time.

## 2022-11-27 ENCOUNTER — Inpatient Hospital Stay: Payer: 59

## 2022-11-27 ENCOUNTER — Inpatient Hospital Stay: Payer: 59 | Attending: Hematology and Oncology | Admitting: Hematology and Oncology

## 2023-01-23 ENCOUNTER — Other Ambulatory Visit (HOSPITAL_COMMUNITY): Payer: Self-pay

## 2023-02-26 IMAGING — US US PLC BREAST LOC DEV 1ST LESION INC US GUIDE*L*
1 series · 2 of 2 positions shown · non-contrast
Comparison: None Available.

CLINICAL DATA: Localization of a left axillary lymph node prior to
surgery

EXAM:
ULTRASOUND GUIDED RADIOACTIVE SEED LOCALIZATION OF THE LEFT BREAST

[Series 1: us plc breast loc dev 1st lesion inc us guide*left · 0.07mm/px · 2 of 2 slices shown]
[im 1/2]
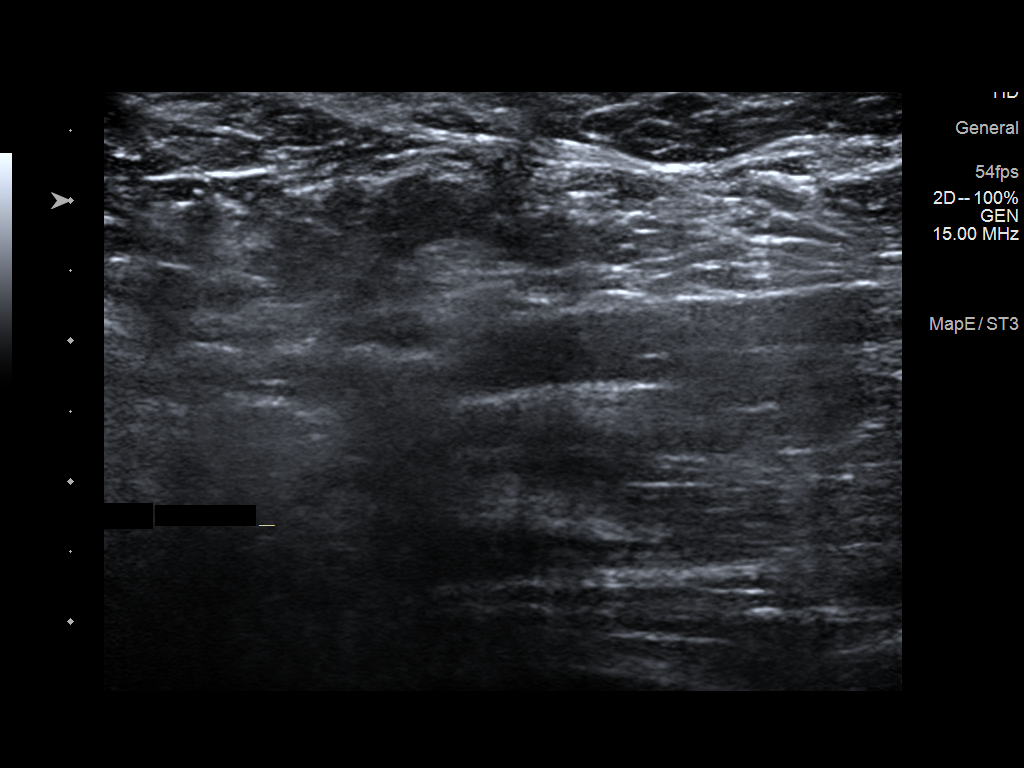
[im 2/2]
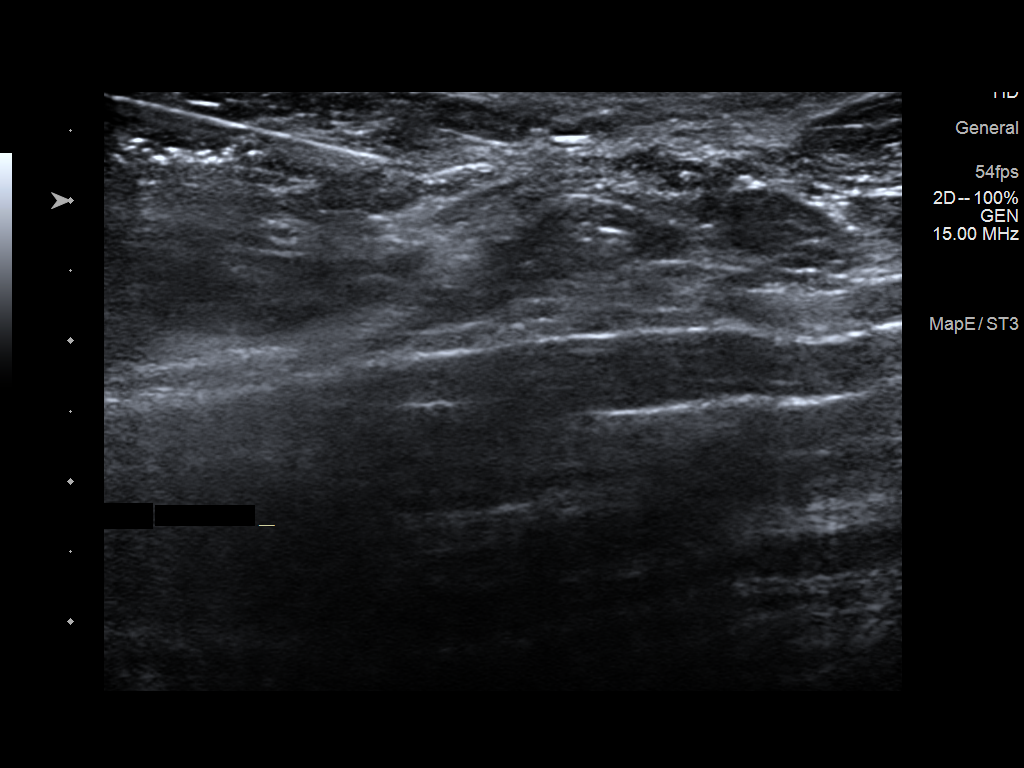

[2 of 2 positions shown; findings below may reference images not displayed]



The usual time-out protocol was performed immediately prior to the
procedure.

Using ultrasound guidance, sterile technique, 1% lidocaine and an
Q-V5P radioactive seed, the left axillary lymph node was localized
using a lateral approach. The follow-up mammogram images confirm the
seed in the expected location and were marked for the surgeon.

Follow-up survey of the patient confirms presence of the radioactive
seed.

Order number of Q-V5P seed:  [id number].

Total activity:  0.247 millicuries reference Date: July 13, 2021

The patient tolerated the procedure well and was released from the
[REDACTED]. She was given instructions regarding seed removal.
IMPRESSION: Radioactive seed localization left axillary lymph node. No apparent
complications.

## 2023-02-27 IMAGING — DX MM BREAST SURGICAL SPECIMEN
1 series · 2 of 2 positions shown · non-contrast
Comparison: None Available.

CLINICAL DATA: Surgical specimens following radioactive seed
localizations of a left breast lesion and a left axillary lymph
node.

EXAM:
SPECIMEN RADIOGRAPH OF THE LEFT BREAST/AXILLA

[Series 2: specimen digital x-ray, derived · left · 0.07mm/px · 2 of 2 slices shown]
[im 1/2]
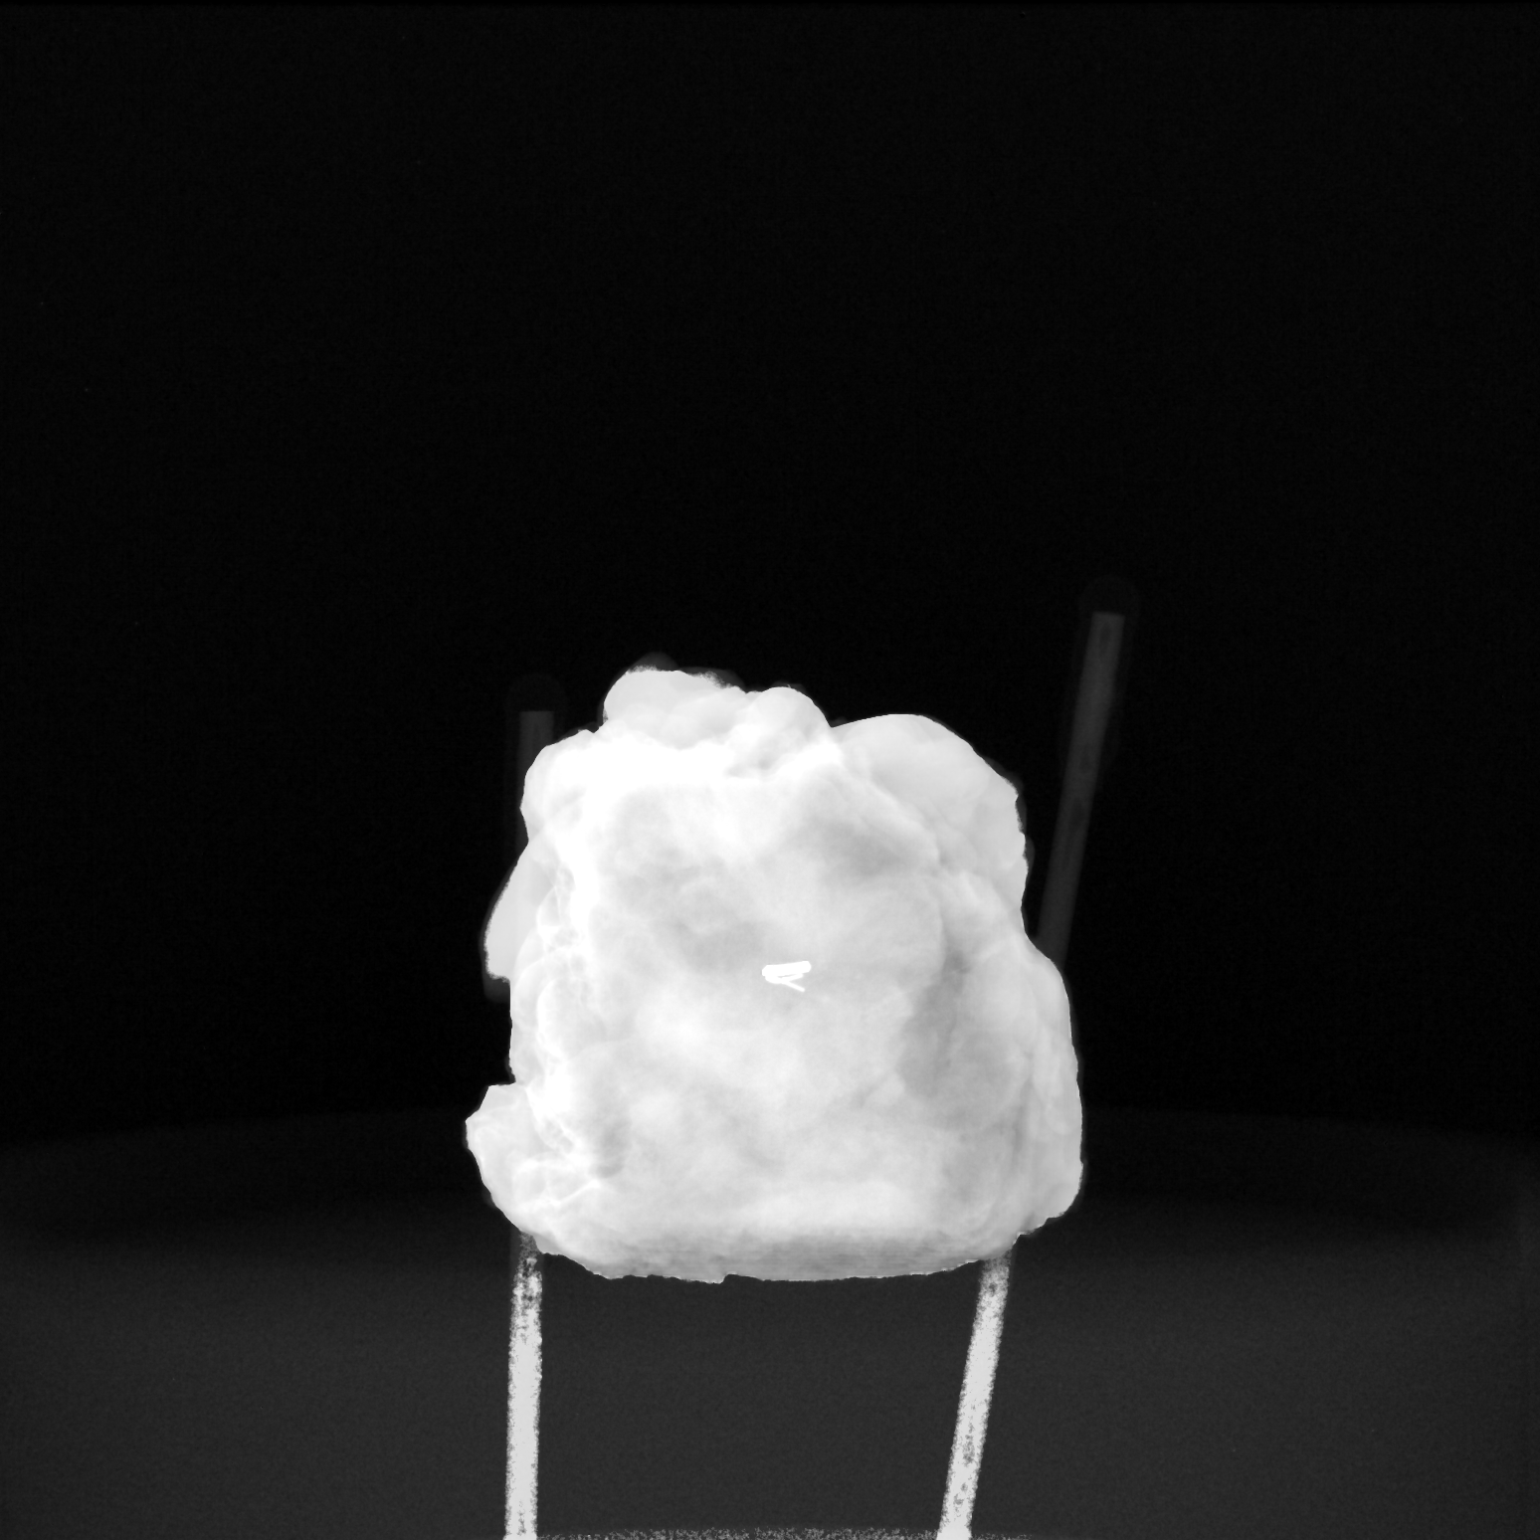
[im 2/2]
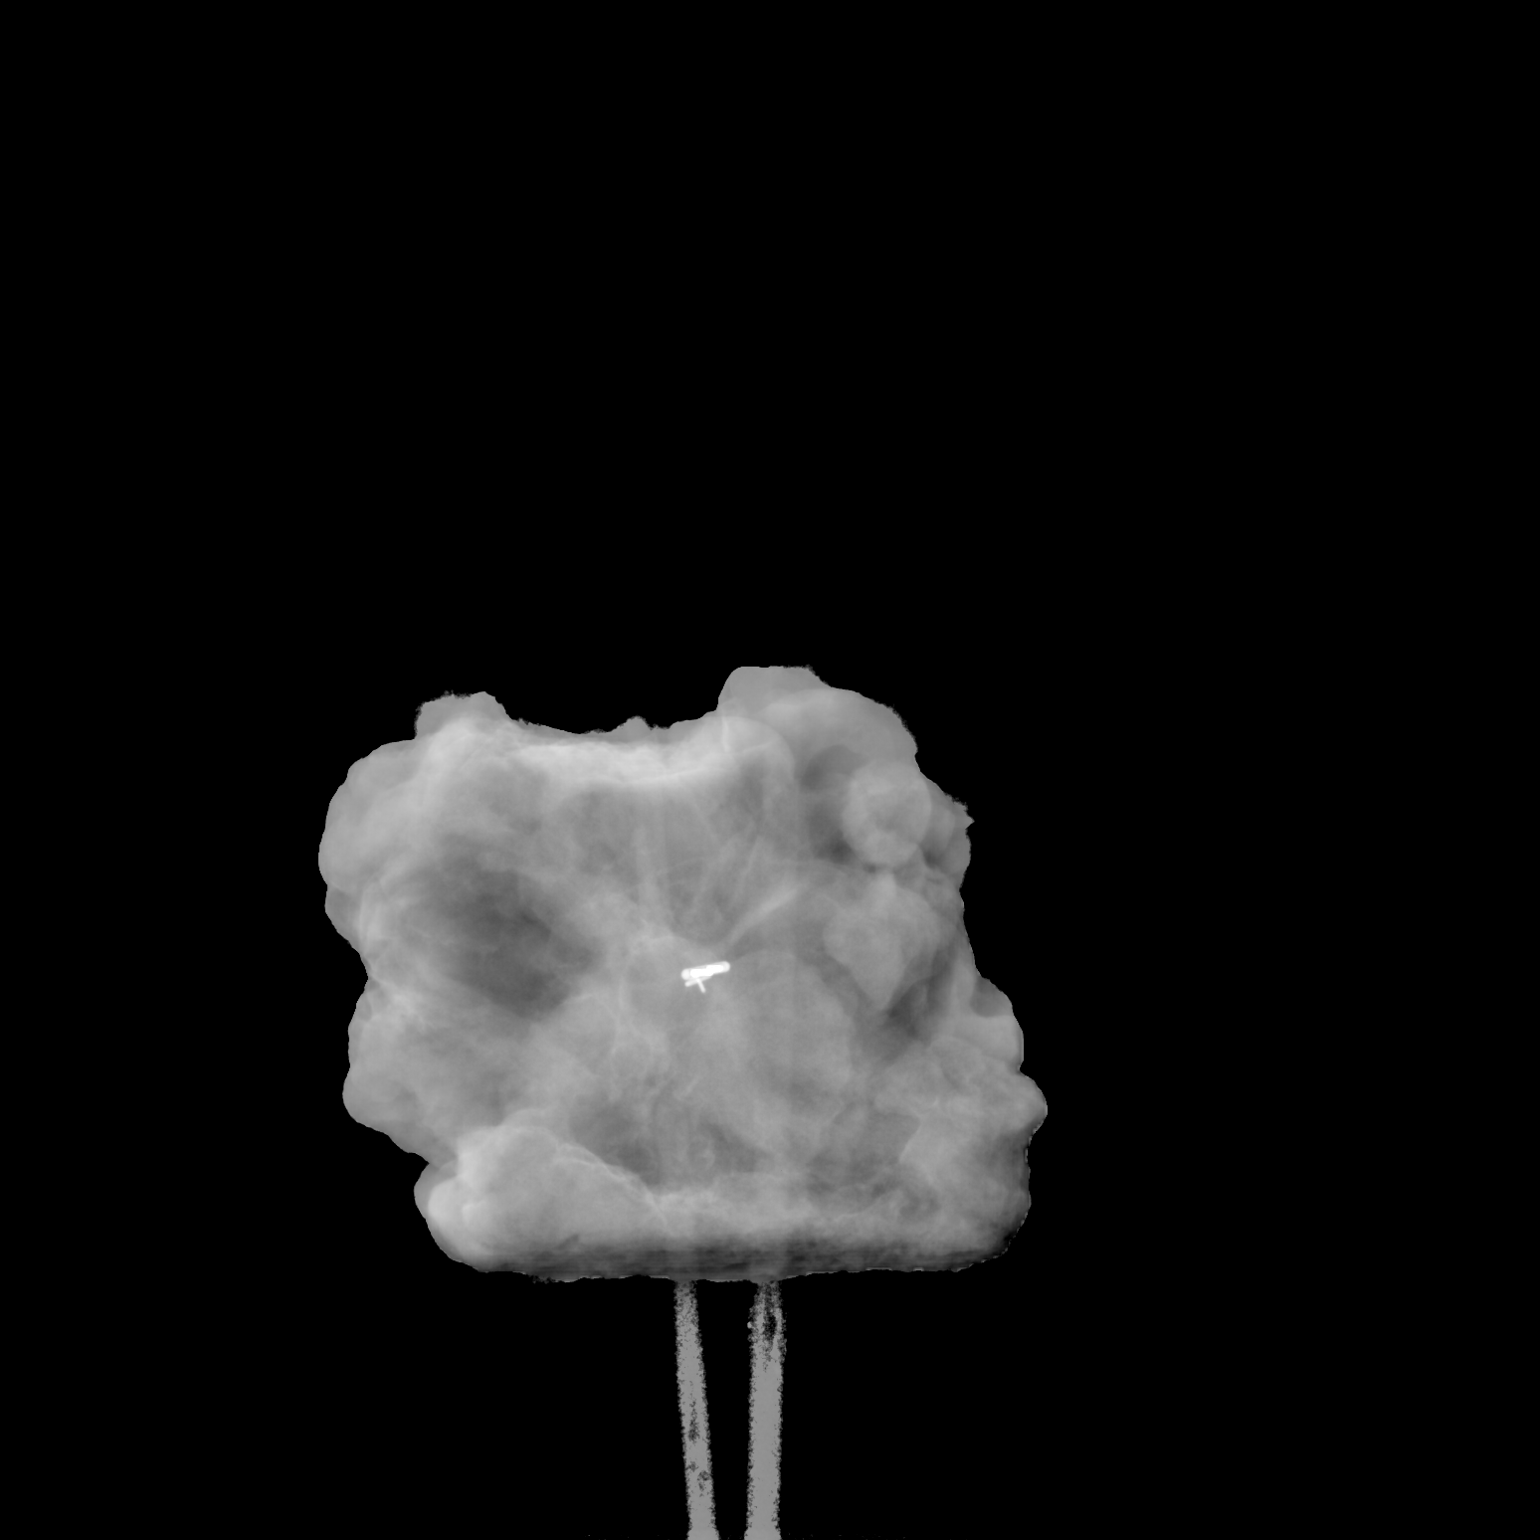

[2 of 2 positions shown; findings below may reference images not displayed]

FINDINGS: Status post excision of the left breast and left axilla. Breast
specimen demonstrates the ribbon shaped biopsy clip and radioactive
seed. The axilla specimen demonstrates the radioactive seed and a
vascular clips, but does not contain the HydroMARK clip.
IMPRESSION: Specimen radiograph of the left breast and axilla.

## 2024-03-10 ENCOUNTER — Encounter: Payer: Self-pay | Admitting: Hematology and Oncology
